# Patient Record
Sex: Male | Born: 1960
Health system: Southern US, Community
[De-identification: ages and names within clinical notes are randomized; demographics above are authoritative.]

## PROBLEM LIST (undated history)

## (undated) DIAGNOSIS — E119 Type 2 diabetes mellitus without complications: Secondary | ICD-10-CM

## (undated) DIAGNOSIS — I10 Essential (primary) hypertension: Secondary | ICD-10-CM

## (undated) HISTORY — DX: Essential (primary) hypertension: I10

## (undated) HISTORY — PX: HERNIA REPAIR: SHX51

---

## 1988-08-20 HISTORY — PX: HERNIA REPAIR: SHX51

## 1999-06-26 ENCOUNTER — Emergency Department (HOSPITAL_COMMUNITY): Admission: EM | Admit: 1999-06-26 | Discharge: 1999-06-26 | Payer: Self-pay | Admitting: Emergency Medicine

## 1999-06-26 ENCOUNTER — Encounter: Payer: Self-pay | Admitting: Emergency Medicine

## 1999-07-07 ENCOUNTER — Emergency Department (HOSPITAL_COMMUNITY): Admission: EM | Admit: 1999-07-07 | Discharge: 1999-07-07 | Payer: Self-pay | Admitting: Emergency Medicine

## 2015-08-24 ENCOUNTER — Emergency Department (HOSPITAL_COMMUNITY): Payer: Self-pay

## 2015-08-24 ENCOUNTER — Emergency Department (HOSPITAL_COMMUNITY)
Admission: EM | Admit: 2015-08-24 | Discharge: 2015-08-25 | Disposition: A | Discharge status: Home / Self Care | Payer: Self-pay | Attending: Emergency Medicine | Admitting: Emergency Medicine

## 2015-08-24 ENCOUNTER — Encounter (HOSPITAL_COMMUNITY): Payer: Self-pay | Admitting: Emergency Medicine

## 2015-08-24 DIAGNOSIS — Z87891 Personal history of nicotine dependence: Secondary | ICD-10-CM | POA: Insufficient documentation

## 2015-08-24 DIAGNOSIS — N410 Acute prostatitis: Secondary | ICD-10-CM | POA: Insufficient documentation

## 2015-08-24 DIAGNOSIS — R1110 Vomiting, unspecified: Secondary | ICD-10-CM | POA: Insufficient documentation

## 2015-08-24 DIAGNOSIS — R739 Hyperglycemia, unspecified: Secondary | ICD-10-CM

## 2015-08-24 DIAGNOSIS — R339 Retention of urine, unspecified: Secondary | ICD-10-CM | POA: Insufficient documentation

## 2015-08-24 DIAGNOSIS — E1165 Type 2 diabetes mellitus with hyperglycemia: Secondary | ICD-10-CM | POA: Insufficient documentation

## 2015-08-24 DIAGNOSIS — Z794 Long term (current) use of insulin: Secondary | ICD-10-CM | POA: Insufficient documentation

## 2015-08-24 HISTORY — DX: Type 2 diabetes mellitus without complications: E11.9

## 2015-08-24 LAB — COMPREHENSIVE METABOLIC PANEL
ALK PHOS: 72 U/L (ref 38–126)
ALT: 24 U/L (ref 17–63)
AST: 23 U/L (ref 15–41)
Albumin: 3.7 g/dL (ref 3.5–5.0)
Anion gap: 13 (ref 5–15)
BILIRUBIN TOTAL: 0.7 mg/dL (ref 0.3–1.2)
BUN: 14 mg/dL (ref 6–20)
CALCIUM: 9.8 mg/dL (ref 8.9–10.3)
CO2: 21 mmol/L — ABNORMAL LOW (ref 22–32)
CREATININE: 0.96 mg/dL (ref 0.61–1.24)
Chloride: 102 mmol/L (ref 101–111)
GFR calc Af Amer: >60 mL/min (ref >60)
GLUCOSE: 309 mg/dL — AB (ref 65–99)
Potassium: 4.2 mmol/L (ref 3.5–5.1)
Sodium: 136 mmol/L (ref 135–145)
TOTAL PROTEIN: 7.8 g/dL (ref 6.5–8.1)

## 2015-08-24 LAB — CBC
HCT: 42.4 % (ref 39.0–52.0)
Hemoglobin: 13.8 g/dL (ref 13.0–17.0)
MCH: 26.1 pg (ref 26.0–34.0)
MCHC: 32.5 g/dL (ref 30.0–36.0)
MCV: 80.2 fL (ref 78.0–100.0)
PLATELETS: 242 10*3/uL (ref 150–400)
RBC: 5.29 MIL/uL (ref 4.22–5.81)
RDW: 13.1 % (ref 11.5–15.5)
WBC: 7.5 10*3/uL (ref 4.0–10.5)

## 2015-08-24 LAB — URINE MICROSCOPIC-ADD ON

## 2015-08-24 LAB — URINALYSIS, ROUTINE W REFLEX MICROSCOPIC
BILIRUBIN URINE: NEGATIVE
Glucose, UA: >1000 mg/dL — AB
Hgb urine dipstick: NEGATIVE
KETONES UR: NEGATIVE mg/dL
LEUKOCYTES UA: NEGATIVE
NITRITE: NEGATIVE
PROTEIN: 100 mg/dL — AB
Specific Gravity, Urine: 1.022 (ref 1.005–1.030)
pH: 7.5 (ref 5.0–8.0)

## 2015-08-24 MED ORDER — CIPROFLOXACIN HCL 500 MG PO TABS
500.0000 mg | ORAL_TABLET | Freq: Once | ORAL | Status: AC
  Administered 2015-08-24: 500 mg via ORAL
  Filled 2015-08-24: qty 1

## 2015-08-24 MED ORDER — CIPROFLOXACIN HCL 500 MG PO TABS: ORAL_TABLET | ORAL | Status: DC

## 2015-08-24 MED ORDER — TAMSULOSIN HCL 0.4 MG PO CAPS: 0.4000 mg | ORAL_CAPSULE | Freq: Every day | ORAL | Status: DC

## 2015-08-24 MED ORDER — OXYCODONE-ACETAMINOPHEN 5-325 MG PO TABS: 1.0000 | ORAL_TABLET | ORAL | Status: DC | PRN

## 2015-08-24 MED ORDER — HYDROMORPHONE HCL 1 MG/ML IJ SOLN
1.0000 mg | Freq: Once | INTRAMUSCULAR | Status: AC
  Administered 2015-08-24: 1 mg via INTRAVENOUS
  Filled 2015-08-24: qty 1

## 2015-08-24 MED ORDER — ONDANSETRON HCL 4 MG/2ML IJ SOLN
4.0000 mg | Freq: Once | INTRAMUSCULAR | Status: AC
  Administered 2015-08-24: 4 mg via INTRAVENOUS
  Filled 2015-08-24: qty 2

## 2015-08-24 MED ORDER — FENTANYL CITRATE (PF) 100 MCG/2ML IJ SOLN
50.0000 ug | Freq: Once | INTRAMUSCULAR | Status: AC
  Administered 2015-08-24: 50 ug via INTRAVENOUS
  Filled 2015-08-24: qty 2

## 2015-08-24 MED ORDER — OXYCODONE-ACETAMINOPHEN 5-325 MG PO TABS
2.0000 | ORAL_TABLET | Freq: Once | ORAL | Status: AC
  Administered 2015-08-24: 2 via ORAL
  Filled 2015-08-24: qty 2

## 2015-08-24 MED ORDER — ONDANSETRON HCL 4 MG/2ML IJ SOLN
4.0000 mg | Freq: Once | INTRAMUSCULAR | Status: AC | PRN
  Administered 2015-08-24: 4 mg via INTRAVENOUS
  Filled 2015-08-24: qty 2

## 2015-08-24 NOTE — Discharge Instructions (Signed)
Prostatitis °Prostatitis is redness, soreness, and puffiness (swelling) of the prostate gland. The prostate gland is the walnut-sized gland located just below your bladder. °HOME CARE:  °· Take all medicines as told by your doctor. °· Take warm-water baths (sitz baths) as told by your doctor. °GET HELP IF: °· Your symptoms get worse, not better. °· You have a fever. °GET HELP RIGHT AWAY IF:  °· You have chills. °· You feel sick to your stomach (nauseous) or like you will throw up (vomit). °· You feel lightheaded or like you will pass out (faint). °· You are unable to pee (urinate). °· You have blood or blood clumps (clots) in your pee (urine). °MAKE SURE YOU: °· Understand these instructions. °· Will watch your condition. °· Will get help right away if you are not doing well or get worse. °  °This information is not intended to replace advice given to you by your health care provider. Make sure you discuss any questions you have with your health care provider. °  °Document Released: 02/05/2012 Document Revised: 08/27/2014 Document Reviewed: 02/23/2013 °Elsevier Interactive Patient Education ©2016 Elsevier Inc. ° °

## 2015-08-24 NOTE — ED Notes (Signed)
EMS-patient to ED for eval of right groin pain, severe in nature, with episode of vomiting. CBG 309. No swelling noted. 18g(LAC)

## 2015-08-24 NOTE — ED Notes (Signed)
Pt o2 saturations dropped after dilaidid administration. Pt placed on 2L [place]. o2 sats 100%.

## 2015-08-24 NOTE — ED Provider Notes (Signed)
CSN: [id number]     Arrival date & time 08/24/15  1740 History   First MD Initiated Contact with Patient 08/24/15 1907     Chief Complaint  Patient presents with  . Groin Pain  . Emesis     Patient is a 55 y.o. male presenting with groin pain and vomiting. The history is provided by the patient.  Groin Pain This is a new problem. The current episode started 12 to 24 hours ago. The problem occurs constantly. The problem has been gradually worsening. Associated symptoms include abdominal pain. Pertinent negatives include no chest pain and no shortness of breath. Exacerbated by: movement. Nothing relieves the symptoms. He has tried nothing for the symptoms.  Emesis Associated symptoms: abdominal pain   pt reports for over the past day he has had RLQ and right groin pain He reports vomiting No diarrhea He has never had this before He reports occurred yesterday, improved, then returned today  Past Medical History  Diagnosis Date  . Diabetes mellitus without complication [facility])    Past Surgical History  Procedure Laterality Date  . Hernia repair Left    No family history on file. Social History  Substance Use Topics  . Smoking status: Former Games developer  . Smokeless tobacco: None  . Alcohol Use: Yes    Review of Systems  Constitutional: Negative for fever.  Respiratory: Negative for shortness of breath.   Cardiovascular: Negative for chest pain.  Gastrointestinal: Positive for vomiting and abdominal pain.  Musculoskeletal: Negative for back pain.  All other systems reviewed and are negative.     Allergies  Review of patient's allergies indicates no known allergies.  Home Medications   Prior to Admission medications   Medication Sig Start Date End Date Taking? Authorizing Provider  insulin aspart (NOVOLOG) 100 UNIT/ML injection Inject 30 Units into the skin 2 (two) times daily. Per sliding scale   Yes Historical Provider, MD   BP 199/116 mmHg  Pulse 94  Temp(Src) 97.7 F  (36.5 C) (Oral)  Resp 20  SpO2 100% Physical Exam CONSTITUTIONAL: Well developed/well nourished, uncomfortable  HEAD: Normocephalic/atraumatic EYES: EOMI ENMT: Mucous membranes moist NECK: supple no meningeal signs SPINE/BACK:entire spine nontender CV: S1/S2 noted, no murmurs/rubs/gallops noted LUNGS: Lungs are clear to auscultation bilaterally, no apparent distress ABDOMEN: soft, severe RLQ tenderness, no rebound or guarding, bowel sounds noted throughout abdomen GU:no cva tenderness, no inguinal hernia, no testicular tenderness/edema (wife at bedside per patient request) NEURO: Pt is awake/alert/appropriate, moves all extremitiesx4.  No facial droop.   EXTREMITIES: pulses normal/equal in both feet, full ROM SKIN: warm, color normal PSYCH: anxious  ED Course  Procedures  Labs Review Labs Reviewed  COMPREHENSIVE METABOLIC PANEL - Abnormal; Notable for the following:    CO2 21 (*)    Glucose, Bld 309 (*)    All other components within normal limits  URINALYSIS, ROUTINE W REFLEX MICROSCOPIC (NOT AT [facility]) - Abnormal; Notable for the following:    Glucose, UA >1000 (*)    Protein, ur 100 (*)    All other components within normal limits  URINE MICROSCOPIC-ADD ON - Abnormal; Notable for the following:    Squamous Epithelial / LPF 0-5 (*)    Bacteria, UA RARE (*)    All other components within normal limits  URINE CULTURE  CBC    Imaging Review Ct Abdomen Pelvis Wo Contrast  08/24/2015  CLINICAL DATA:  55 year old with right groin pain. Episode of vomiting. EXAM: CT ABDOMEN AND PELVIS WITHOUT CONTRAST TECHNIQUE: Multidetector  CT imaging of the abdomen and pelvis was performed following the standard protocol without IV contrast. COMPARISON:  None. FINDINGS: Lower chest:  Lung bases are clear. Hepatobiliary: No mass visualized on this un-enhanced exam. Pancreas: No mass or inflammatory process identified on this un-enhanced exam. Spleen: Within normal limits in size. Adrenals/Urinary  Tract: Normal appearance of both kidneys without hydronephrosis or stones. Normal appearance of the right adrenal gland. Difficult to exclude mild nodularity of the left adrenal gland versus an adjacent small lymph node. Mild to moderate distention of the urinary bladder. Stomach/Bowel: No acute abnormality to the stomach or duodenum. No evidence for bowel obstruction. Normal appearance of the appendix. Few fluid filled loops of small bowel in the right lower quadrant but no evidence for an inguinal hernia. Vascular/Lymphatic: Few atherosclerotic calcifications in the abdominal aorta without significant enlargement. There is no significant lymphadenopathy. Question a small nodule versus small lymph node adjacent to the left adrenal gland. This structure measures up to 1.2 cm on sequence 4, image 39. Small bilateral inguinal lymph nodes. Reproductive: Prostate appears to be enlarged measuring up to 5.6 cm in the transverse dimension. No gross abnormality to the seminal vesicles. Evidence for multiple calcifications along the right inguinal canal extending along the spermatic cord. There are also calcifications on the left side, near the scrotum. Other: No free fluid.  No free air.  Tiny umbilical hernia. Musculoskeletal:  No suspicious bone lesions identified. IMPRESSION: Few fluid-filled loops of small bowel in the right lower quadrant. No acute inflammatory changes in the right lower quadrant and normal appearance of the appendix. Calcifications involving the spermatic cords, right side greater the left. Findings are nonspecific but could be related to prior inflammation. No significant inguinal hernia. Moderate distention of the urinary bladder with prostate enlargement. Question bladder retention. Question a slightly prominent lymph node versus left adrenal nodule. This may be an incidental finding. This area could be better characterized with a post contrast examination. Electronically Signed   By: Richarda Overlie  M.D.   On: 08/24/2015 21:23   I have personally reviewed and evaluated these lab results as part of my medical decision-making.  8:11 PM Pt with significant RLQ tenderness, CT imaging ordered, possible ureterolithiasis possible given groin pain, although currently urine is negative for hematuria Imaging pending at this time 10:27 PM Pt was passing urine in the ED but CT shows possible urinary retention No focal abd tenderness on this exam He appears improved Prostate exam - no mass or nodules.  Prostate is enlarged and tender.  Nurse emmie present for exam Foley catheter in place (bladder scan showed >650ml) Will need start flomax/cipro with urology followup 11:48 PM Pt improved He is nontoxic Will treat for possible prostatitis with urinary retention He was referred to urology Will place on cipro/flomax/percocet  Medications  ondansetron (ZOFRAN) injection 4 mg (4 mg Intravenous Given 08/24/15 1838)  fentaNYL (SUBLIMAZE) injection 50 mcg (50 mcg Intravenous Given 08/24/15 1839)  HYDROmorphone (DILAUDID) injection 1 mg (1 mg Intravenous Given 08/24/15 1942)  ondansetron (ZOFRAN) injection 4 mg (4 mg Intravenous Given 08/24/15 1941)  ciprofloxacin (CIPRO) tablet 500 mg (500 mg Oral Given 08/24/15 2247)  oxyCODONE-acetaminophen (PERCOCET/ROXICET) 5-325 MG per tablet 2 tablet (2 tablets Oral Given 08/24/15 2247)    MDM   Final diagnoses:  Urinary retention  Acute prostatitis  Hyperglycemia    Nursing notes including past medical history and social history reviewed and considered in documentation Labs/vital reviewed myself and considered during evaluation  Zadie Rhineonald Reginald Mangels, MD 08/24/15 2351

## 2015-08-26 LAB — URINE CULTURE: CULTURE: NO GROWTH

## 2015-10-28 DIAGNOSIS — E782 Mixed hyperlipidemia: Secondary | ICD-10-CM | POA: Insufficient documentation

## 2015-10-28 DIAGNOSIS — I10 Essential (primary) hypertension: Secondary | ICD-10-CM | POA: Diagnosis present

## 2018-08-22 ENCOUNTER — Telehealth: Payer: Self-pay

## 2018-08-22 NOTE — Telephone Encounter (Signed)
Prior approval received from [facility] for Vyvanse 50mg . Effective 08/18/2018-08/19/2019 [id number] [facility] faxed approval

## 2018-09-25 ENCOUNTER — Ambulatory Visit: Payer: Self-pay | Admitting: Psychiatry

## 2018-10-11 ENCOUNTER — Emergency Department (HOSPITAL_COMMUNITY)
Admission: EM | Admit: 2018-10-11 | Discharge: 2018-10-11 | Disposition: A | Discharge status: Home / Self Care | Payer: Self-pay | Attending: Emergency Medicine | Admitting: Emergency Medicine

## 2018-10-11 ENCOUNTER — Emergency Department (HOSPITAL_COMMUNITY): Payer: Self-pay

## 2018-10-11 ENCOUNTER — Encounter (HOSPITAL_COMMUNITY): Payer: Self-pay | Admitting: Emergency Medicine

## 2018-10-11 DIAGNOSIS — Z79899 Other long term (current) drug therapy: Secondary | ICD-10-CM | POA: Insufficient documentation

## 2018-10-11 DIAGNOSIS — M79651 Pain in right thigh: Secondary | ICD-10-CM | POA: Insufficient documentation

## 2018-10-11 DIAGNOSIS — Z7982 Long term (current) use of aspirin: Secondary | ICD-10-CM | POA: Insufficient documentation

## 2018-10-11 DIAGNOSIS — E1165 Type 2 diabetes mellitus with hyperglycemia: Secondary | ICD-10-CM | POA: Insufficient documentation

## 2018-10-11 DIAGNOSIS — R739 Hyperglycemia, unspecified: Secondary | ICD-10-CM

## 2018-10-11 LAB — URINALYSIS, ROUTINE W REFLEX MICROSCOPIC
BACTERIA UA: NONE SEEN
Bilirubin Urine: NEGATIVE
Ketones, ur: 5 mg/dL — AB
Leukocytes,Ua: NEGATIVE
NITRITE: NEGATIVE
PH: 6 (ref 5.0–8.0)
PROTEIN: 30 mg/dL — AB
SPECIFIC GRAVITY, URINE: 1.02 (ref 1.005–1.030)

## 2018-10-11 LAB — COMPREHENSIVE METABOLIC PANEL
ALBUMIN: 3.8 g/dL (ref 3.5–5.0)
ALT: 24 U/L (ref 0–44)
AST: 25 U/L (ref 15–41)
Alkaline Phosphatase: 82 U/L (ref 38–126)
Anion gap: 8 (ref 5–15)
BUN: 23 mg/dL — AB (ref 6–20)
CHLORIDE: 101 mmol/L (ref 98–111)
CO2: 24 mmol/L (ref 22–32)
CREATININE: 1.3 mg/dL — AB (ref 0.61–1.24)
Calcium: 9.1 mg/dL (ref 8.9–10.3)
GFR calc Af Amer: >60 mL/min (ref >60)
GFR calc non Af Amer: >60 mL/min (ref >60)
GLUCOSE: 377 mg/dL — AB (ref 70–99)
Potassium: 5.9 mmol/L — ABNORMAL HIGH (ref 3.5–5.1)
SODIUM: 133 mmol/L — AB (ref 135–145)
Total Bilirubin: 0.5 mg/dL (ref 0.3–1.2)
Total Protein: 7.8 g/dL (ref 6.5–8.1)

## 2018-10-11 LAB — CBC WITH DIFFERENTIAL/PLATELET
ABS IMMATURE GRANULOCYTES: 0.01 10*3/uL (ref 0.00–0.07)
BASOS PCT: 1 %
Basophils Absolute: 0 10*3/uL (ref 0.0–0.1)
Eosinophils Absolute: 0 10*3/uL (ref 0.0–0.5)
Eosinophils Relative: 1 %
HCT: 49.3 % (ref 39.0–52.0)
Hemoglobin: 14.2 g/dL (ref 13.0–17.0)
Immature Granulocytes: 0 %
LYMPHS PCT: 26 %
Lymphs Abs: 1.7 10*3/uL (ref 0.7–4.0)
MCH: 25.2 pg — ABNORMAL LOW (ref 26.0–34.0)
MCHC: 28.8 g/dL — ABNORMAL LOW (ref 30.0–36.0)
MCV: 87.6 fL (ref 80.0–100.0)
Monocytes Absolute: 0.6 10*3/uL (ref 0.1–1.0)
Monocytes Relative: 10 %
NEUTROS ABS: 4.2 10*3/uL (ref 1.7–7.7)
Neutrophils Relative %: 62 %
PLATELETS: 199 10*3/uL (ref 150–400)
RBC: 5.63 MIL/uL (ref 4.22–5.81)
RDW: 13.2 % (ref 11.5–15.5)
WBC: 6.7 10*3/uL (ref 4.0–10.5)
nRBC: 0 % (ref 0.0–0.2)

## 2018-10-11 LAB — POTASSIUM: Potassium: 5.1 mmol/L (ref 3.5–5.1)

## 2018-10-11 LAB — CBG MONITORING, ED: Glucose-Capillary: 194 mg/dL — ABNORMAL HIGH (ref 70–99)

## 2018-10-11 LAB — CK: Total CK: 107 U/L (ref 49–397)

## 2018-10-11 MED ORDER — ONDANSETRON HCL 4 MG/2ML IJ SOLN
4.0000 mg | Freq: Once | INTRAMUSCULAR | Status: DC
  Filled 2018-10-11: qty 2

## 2018-10-11 MED ORDER — ONDANSETRON 4 MG PO TBDP
ORAL_TABLET | ORAL | Status: AC
  Filled 2018-10-11: qty 1

## 2018-10-11 MED ORDER — HYDROCODONE-ACETAMINOPHEN 5-325 MG PO TABS: 1.0000 | ORAL_TABLET | ORAL | 0 refills | Status: DC | PRN

## 2018-10-11 MED ORDER — INSULIN ASPART 100 UNIT/ML ~~LOC~~ SOLN
8.0000 [IU] | Freq: Once | SUBCUTANEOUS | Status: AC
  Administered 2018-10-11: 8 [IU] via SUBCUTANEOUS
  Filled 2018-10-11: qty 1

## 2018-10-11 MED ORDER — ONDANSETRON 4 MG PO TBDP
4.0000 mg | ORAL_TABLET | Freq: Once | ORAL | Status: AC
  Administered 2018-10-11: 4 mg via ORAL

## 2018-10-11 MED ORDER — MORPHINE SULFATE (PF) 4 MG/ML IV SOLN
4.0000 mg | Freq: Once | INTRAVENOUS | Status: AC
  Administered 2018-10-11: 4 mg via INTRAMUSCULAR

## 2018-10-11 MED ORDER — IOHEXOL 300 MG/ML  SOLN
75.0000 mL | Freq: Once | INTRAMUSCULAR | Status: AC | PRN
Start: 2018-10-11 — End: 2018-10-11
  Administered 2018-10-11: 75 mL via INTRAVENOUS

## 2018-10-11 MED ORDER — MORPHINE SULFATE (PF) 4 MG/ML IV SOLN
4.0000 mg | Freq: Once | INTRAVENOUS | Status: DC
  Filled 2018-10-11: qty 1

## 2018-10-11 MED ORDER — HYDROCODONE-ACETAMINOPHEN 5-325 MG PO TABS
1.0000 | ORAL_TABLET | Freq: Once | ORAL | Status: AC
  Administered 2018-10-11: 1 via ORAL
  Filled 2018-10-11: qty 1

## 2018-10-11 NOTE — Discharge Instructions (Signed)
Your lab tests, ultrasound and x-rays are negative today for the source of your pain.  Your blood glucose is very elevated today but improving after the insulin medication given.  Keep a close watch on your blood glucose levels and please get rechecked within 1 week.  The [facility] is part of [facility] and this is an excellent place for follow-up care from today's visit.  They also have plenty of resources available for obtaining medications and arranging follow-up medical care.  You may use the medication prescribed for pain relief, however do not drive within 4 hours of taking this medication as it will make you drowsy.  I also recommend a heating pad applied to your thigh for 20 minutes 2-3 times daily which may help offer pain relief.  [facility]  [address] [place], [place] [address] [phone]  Services [facility] offers a variety of basic health services.  Services include but are not limited to: Blood pressure checks  Heart rate checks  Blood sugar checks  Urine analysis  Rapid strep tests  Pregnancy tests.  Health education and referrals  People needing more complex services will be directed to a physician online. Using these virtual visits, doctors can evaluate and prescribe medicine and treatments. There will be no medication on-site, though [facility] will help patients fill their prescriptions at little to no cost.   For More information please go to: [email or web address]

## 2018-10-11 NOTE — ED Triage Notes (Signed)
Pt states he has been having pain in right leg from groin to mid thigh for a week.  No injury.

## 2018-10-11 NOTE — ED Provider Notes (Signed)
[facility]ERGENCY DEPARTMENT Provider Note   CSN: [id number] Arrival date & time: 10/11/18  0815    History   Chief Complaint Chief Complaint  Patient presents with  . Leg Pain    HPI Chad Kirk is a 58 y.o. male with a history of diabetes, no PCP but is treating his diabetes with insulin under fair control presenting with a one-week history of right mid anterior thigh to groin pain.  He denies any injuries to the site.  His pain is described as a deep aching pain which is worsened with movement in palpation or pressure applied to the thigh.  He has had no fevers or chills, denies rash, skin changes or wounds, no edema.  He denies history of low back or buttock pain.  Also denies scrotal or penile pain, no dysuria or penile discharge.  He has taken Advil and Tylenol with no significant improvement in his symptoms.  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    The history is provided by the patient.    Past Medical History:  Diagnosis Date  . Diabetes mellitus without complication (HCC)     There are no active problems to display for this patient.   Past Surgical History:  Procedure Laterality Date  . HERNIA REPAIR Left         Home Medications    Prior to Admission medications   Medication Sig Start Date End Date Taking? Authorizing Provider  aspirin EC 81 MG tablet Take 1 tablet by mouth daily.   Yes [provider]  insulin NPH Human (HUMULIN N,NOVOLIN N) 100 UNIT/ML injection Inject 12-15 Units into the skin 2 (two) times daily. Inject 15 units under the skin 30 min before breakfast. Inject 12 units under the skin 30 min before supper. 10/13/15  Yes [provider]  insulin regular (NOVOLIN R,HUMULIN R) 100 units/mL injection Inject 4-12 Units into the skin See admin instructions. Inject 8 units under the skin 30 min before each meal. Take 4 units for a small meal and 12 units  for a large meal. 10/13/15  Yes [provider]  naproxen sodium (ALEVE) 220 MG tablet Take 220 mg by mouth daily as needed (pain).   Yes [provider]  HYDROcodone-acetaminophen (NORCO/VICODIN) 5-325 MG tablet Take 1 tablet by mouth every 4 (four) hours as needed. 10/11/18   Burgess AmorIdol, Rashena Dowling, PA-C    Family History History reviewed. No pertinent family history.  Social History Social History   Tobacco Use  . Smoking status: Former Games developermoker  . Smokeless tobacco: Never Used  Substance Use Topics  . Alcohol use: Yes    Comment: daily  . Drug use: No     Allergies   Patient has no known allergies.   Review of Systems Review of Systems  Constitutional: Negative for chills and fever.  HENT: Negative for congestion and sore throat.   Eyes: Negative.   Respiratory: Negative for chest tightness and shortness of breath.   Cardiovascular: Negative for chest pain.  Gastrointestinal: Negative for abdominal pain, nausea and vomiting.  Genitourinary: Negative.   Musculoskeletal: Positive for arthralgias. Negative for joint swelling and neck pain.  Skin: Negative.  Negative for color change, rash and wound.  Neurological: Negative for dizziness, weakness, light-headedness, numbness and headaches.  Psychiatric/Behavioral: Negative.      Physical Exam Updated Vital Signs BP (!) 142/102 (BP Location: Left Arm)  Pulse 84   Temp 98.2 F (36.8 C) (Oral)   Resp 16   Ht 5\' 10"  (1.778 m)   Wt 77.1 kg   SpO2 98%   BMI 24.39 kg/m   Physical Exam Vitals signs and nursing note reviewed. Exam conducted with a chaperone present.  Constitutional:      Appearance: He is well-developed.  HENT:     Head: Normocephalic and atraumatic.  Eyes:     Conjunctiva/sclera: Conjunctivae normal.  Neck:     Musculoskeletal: Normal range of motion. No muscular tenderness.  Cardiovascular:     Rate and Rhythm: Normal rate and regular rhythm.     Heart sounds: Normal heart sounds.    Pulmonary:     Effort: Pulmonary effort is normal.     Breath sounds: Normal breath sounds. No wheezing.  Abdominal:     General: Bowel sounds are normal. There is no distension.     Palpations: Abdomen is soft.     Tenderness: There is no abdominal tenderness.  Genitourinary:    Penis: Normal.      Scrotum/Testes: Normal.        Right: Mass, tenderness or swelling not present.        Left: Mass, tenderness or swelling not present.  Musculoskeletal: Normal range of motion.     Right hip: He exhibits tenderness. He exhibits no crepitus and no deformity.     Comments: Patient is exquisitely tender to palpation of his musculature of his right anterior mid to upper thigh to his groin.  There is no rash, erythema, induration or wounds present.  Nontender over the greater trochanter.  Pain is worsened with attempts at hip flexion and with internal and external rotation.  Skin:    General: Skin is warm and dry.  Neurological:     Mental Status: He is alert.      ED Treatments / Results  Labs (all labs ordered are listed, but only abnormal results are displayed) Labs Reviewed  CBC WITH DIFFERENTIAL/PLATELET - Abnormal; Notable for the following components:      Result Value   MCH 25.2 (*)    MCHC 28.8 (*)    All other components within normal limits  COMPREHENSIVE METABOLIC PANEL - Abnormal; Notable for the following components:   Sodium 133 (*)    Potassium 5.9 (*)    Glucose, Bld 377 (*)    BUN 23 (*)    Creatinine, Ser 1.30 (*)    All other components within normal limits  URINALYSIS, ROUTINE W REFLEX MICROSCOPIC - Abnormal; Notable for the following components:   Color, Urine STRAW (*)    Glucose, UA >=500 (*)    Hgb urine dipstick SMALL (*)    Ketones, ur 5 (*)    Protein, ur 30 (*)    All other components within normal limits  CBG MONITORING, ED - Abnormal; Notable for the following components:   Glucose-Capillary 194 (*)    All other components within normal limits   POTASSIUM  CK    EKG None  Radiology US Venous Img Lower Right (dvt Study)  Result Date: 10/11/2018 CLINICAL DATA:  58 year old with right leg pain. EXAM: RIGHT LOWER EXTREMITY VENOUS DOPPLER ULTRASOUND TECHNIQUE: Gray-scale sonography with graded compression, as well as color Doppler and duplex ultrasound were performed to evaluate the lower extremity deep venous systems from the level of the common femoral vein and including the common femoral, femoral, profunda femoral, popliteal and calf veins including the posterior tibial, peroneal and  gastrocnemius veins when visible. The superficial great saphenous vein was also interrogated. Spectral Doppler was utilized to evaluate flow at rest and with distal augmentation maneuvers in the common femoral, femoral and popliteal veins. COMPARISON:  None. FINDINGS: Contralateral Common Femoral Vein: Respiratory phasicity is normal and symmetric with the symptomatic side. No evidence of thrombus. Normal compressibility. Common Femoral Vein: No evidence of thrombus. Normal compressibility, respiratory phasicity and response to augmentation. Saphenofemoral Junction: No evidence of thrombus. Normal compressibility and flow on color Doppler imaging. Profunda Femoral Vein: No evidence of thrombus. Normal compressibility and flow on color Doppler imaging. Femoral Vein: No evidence of thrombus. Normal compressibility, respiratory phasicity and response to augmentation. Popliteal Vein: No evidence of thrombus. Normal compressibility, respiratory phasicity and response to augmentation. Calf Veins: No evidence of thrombus. Normal compressibility and flow on color Doppler imaging. Superficial Great Saphenous Vein: No evidence of thrombus. Normal compressibility. Venous Reflux:  None. Other Findings:  None. IMPRESSION: Negative for deep venous thrombosis in right lower extremity. Electronically Signed   By: Richarda Overlie M.D.   On: 10/11/2018 11:11   Ct Extremity Lower Right W  Contrast  Result Date: 10/11/2018 CLINICAL DATA:  Right leg pain from the groin to the mid thigh for 1 week. No known injury. Diabetic patient. Clinical concern for necrotizing fasciitis. EXAM: CT OF THE LOWER RIGHT EXTREMITY WITH CONTRAST TECHNIQUE: Multidetector CT imaging of the lower right extremity was performed according to the standard protocol following intravenous contrast administration. COMPARISON:  None. CONTRAST:  75 mL OMNIPAQUE IOHEXOL 300 MG/ML  SOLN FINDINGS: Bones/Joint/Cartilage No acute or focal abnormality is identified. Mild degenerative change about the right hip and knee noted. No avascular necrosis of the femoral head. No lytic or sclerotic lesion. Ligaments Suboptimally assessed by CT. Muscles and Tendons No soft tissue gas within muscle or tracking along fascial planes is identified. No evidence of intramuscular edema. No fluid collection. No tear or strain. Soft tissues Normal. IMPRESSION: Normal exam. No finding to explain the patient's symptoms. Negative for fasciitis. Electronically Signed   By: Drusilla Kanner M.D.   On: 10/11/2018 13:02   Dg Hip Unilat W Or W/o Pelvis 2-3 Views Right  Result Date: 10/11/2018 CLINICAL DATA:  Right hip pain 1 week no injury EXAM: DG HIP (WITH OR WITHOUT PELVIS) 2-3V RIGHT COMPARISON:  None. FINDINGS: Normal alignment. No fracture or AVN. Joint space normal. Benign-appearing lucencies in the lateral femoral neck likely synovial herniation pit, not likely clinically significant. IMPRESSION: No acute abnormality. Electronically Signed   By: Marlan Palau M.D.   On: 10/11/2018 09:45    Procedures Procedures (including critical care time)  Medications Ordered in ED Medications  ondansetron (ZOFRAN-ODT) 4 MG disintegrating tablet (  Not Given 10/11/18 0936)  morphine 4 MG/ML injection 4 mg (4 mg Intramuscular Given 10/11/18 0936)  ondansetron (ZOFRAN-ODT) disintegrating tablet 4 mg (4 mg Oral Given 10/11/18 0936)  insulin aspart (novoLOG)  injection 8 Units (8 Units Subcutaneous Given 10/11/18 1024)  iohexol (OMNIPAQUE) 300 MG/ML solution 75 mL (75 mLs Intravenous Contrast Given 10/11/18 1238)  HYDROcodone-acetaminophen (NORCO/VICODIN) 5-325 MG per tablet 1 tablet (1 tablet Oral Given 10/11/18 1403)     Initial Impression / Assessment and Plan / ED Course  I have reviewed the triage vital signs and the nursing notes.  Pertinent labs & imaging results that were available during my care of the patient were reviewed by me and considered in my medical decision making (see chart for details).  Imaging and labs reviewed and discussed with patient and his wife.  Patient with significant right thigh pain of unclear etiology.  We have ruled out worse case scenario necrotizing fasciitis.  No DVT, no significant arthritis or bony abnormalities.  He was placed on hydrocodone, caution given regarding sedation.  Discussed heat therapy.  I suspect that his pain is musculoskeletal source, possible muscle strain, although no clear-cut history suggesting this.  He was given multiple referrals for follow-up care including obtaining a PCP.  His blood glucose was elevated today, improved at the time of discharge after given subcu insulin.  Advised recheck within 1 week with the [facility].  PRN follow-up anticipated.  Final Clinical Impressions(s) / ED Diagnoses   Final diagnoses:  Acute thigh pain, right  Hyperglycemia    ED Discharge Orders         Ordered    HYDROcodone-acetaminophen (NORCO/VICODIN) 5-325 MG tablet  Every 4 hours PRN     10/11/18 1329           Burgess Amor, PA-C 10/11/18 1405    Samuel Jester, DO 10/15/18 1506

## 2018-10-18 DIAGNOSIS — G5711 Meralgia paresthetica, right lower limb: Secondary | ICD-10-CM | POA: Insufficient documentation

## 2018-10-19 DIAGNOSIS — F1020 Alcohol dependence, uncomplicated: Secondary | ICD-10-CM | POA: Insufficient documentation

## 2018-10-19 DIAGNOSIS — N400 Enlarged prostate without lower urinary tract symptoms: Secondary | ICD-10-CM | POA: Diagnosis present

## 2018-11-12 DIAGNOSIS — IMO0002 Reserved for concepts with insufficient information to code with codable children: Secondary | ICD-10-CM | POA: Diagnosis present

## 2018-11-12 DIAGNOSIS — E119 Type 2 diabetes mellitus without complications: Secondary | ICD-10-CM | POA: Diagnosis present

## 2018-11-12 DIAGNOSIS — E1165 Type 2 diabetes mellitus with hyperglycemia: Secondary | ICD-10-CM | POA: Insufficient documentation

## 2018-11-12 DIAGNOSIS — Z794 Long term (current) use of insulin: Secondary | ICD-10-CM | POA: Insufficient documentation

## 2018-11-12 DIAGNOSIS — E1139 Type 2 diabetes mellitus with other diabetic ophthalmic complication: Secondary | ICD-10-CM | POA: Diagnosis present

## 2019-06-29 ENCOUNTER — Encounter (INDEPENDENT_AMBULATORY_CARE_PROVIDER_SITE_OTHER): Payer: Self-pay | Admitting: Ophthalmology

## 2019-07-01 ENCOUNTER — Encounter (INDEPENDENT_AMBULATORY_CARE_PROVIDER_SITE_OTHER): Payer: Self-pay | Admitting: Ophthalmology

## 2019-07-01 DIAGNOSIS — H43813 Vitreous degeneration, bilateral: Secondary | ICD-10-CM

## 2019-07-01 DIAGNOSIS — E103513 Type 1 diabetes mellitus with proliferative diabetic retinopathy with macular edema, bilateral: Secondary | ICD-10-CM

## 2019-07-01 DIAGNOSIS — E10311 Type 1 diabetes mellitus with unspecified diabetic retinopathy with macular edema: Secondary | ICD-10-CM

## 2019-07-01 DIAGNOSIS — H2513 Age-related nuclear cataract, bilateral: Secondary | ICD-10-CM

## 2019-07-03 DIAGNOSIS — H3581 Retinal edema: Secondary | ICD-10-CM | POA: Diagnosis present

## 2019-07-03 DIAGNOSIS — H2512 Age-related nuclear cataract, left eye: Secondary | ICD-10-CM | POA: Insufficient documentation

## 2019-08-10 DIAGNOSIS — E11311 Type 2 diabetes mellitus with unspecified diabetic retinopathy with macular edema: Secondary | ICD-10-CM | POA: Insufficient documentation

## 2019-08-10 DIAGNOSIS — E113522 Type 2 diabetes mellitus with proliferative diabetic retinopathy with traction retinal detachment involving the macula, left eye: Secondary | ICD-10-CM | POA: Insufficient documentation

## 2019-11-16 DIAGNOSIS — H3342 Traction detachment of retina, left eye: Secondary | ICD-10-CM | POA: Insufficient documentation

## 2019-12-04 ENCOUNTER — Inpatient Hospital Stay (HOSPITAL_COMMUNITY)
Admission: EM | Admit: 2019-12-04 | Discharge: 2019-12-08 | DRG: 073 | Disposition: A | Discharge status: Home / Self Care | Payer: Self-pay | Attending: Family Medicine | Admitting: Family Medicine

## 2019-12-04 ENCOUNTER — Emergency Department (HOSPITAL_COMMUNITY): Payer: Self-pay

## 2019-12-04 ENCOUNTER — Encounter (HOSPITAL_COMMUNITY): Payer: Self-pay | Admitting: Emergency Medicine

## 2019-12-04 ENCOUNTER — Other Ambulatory Visit: Payer: Self-pay

## 2019-12-04 DIAGNOSIS — Z20822 Contact with and (suspected) exposure to covid-19: Secondary | ICD-10-CM | POA: Diagnosis present

## 2019-12-04 DIAGNOSIS — N401 Enlarged prostate with lower urinary tract symptoms: Secondary | ICD-10-CM | POA: Diagnosis present

## 2019-12-04 DIAGNOSIS — E119 Type 2 diabetes mellitus without complications: Secondary | ICD-10-CM | POA: Diagnosis present

## 2019-12-04 DIAGNOSIS — N138 Other obstructive and reflux uropathy: Secondary | ICD-10-CM | POA: Diagnosis present

## 2019-12-04 DIAGNOSIS — Z87891 Personal history of nicotine dependence: Secondary | ICD-10-CM

## 2019-12-04 DIAGNOSIS — E11319 Type 2 diabetes mellitus with unspecified diabetic retinopathy without macular edema: Secondary | ICD-10-CM | POA: Diagnosis present

## 2019-12-04 DIAGNOSIS — IMO0002 Reserved for concepts with insufficient information to code with codable children: Secondary | ICD-10-CM | POA: Diagnosis present

## 2019-12-04 DIAGNOSIS — Z87440 Personal history of urinary (tract) infections: Secondary | ICD-10-CM

## 2019-12-04 DIAGNOSIS — E10319 Type 1 diabetes mellitus with unspecified diabetic retinopathy without macular edema: Secondary | ICD-10-CM | POA: Diagnosis present

## 2019-12-04 DIAGNOSIS — E1139 Type 2 diabetes mellitus with other diabetic ophthalmic complication: Secondary | ICD-10-CM | POA: Diagnosis present

## 2019-12-04 DIAGNOSIS — R112 Nausea with vomiting, unspecified: Secondary | ICD-10-CM

## 2019-12-04 DIAGNOSIS — K254 Chronic or unspecified gastric ulcer with hemorrhage: Secondary | ICD-10-CM | POA: Diagnosis present

## 2019-12-04 DIAGNOSIS — R339 Retention of urine, unspecified: Secondary | ICD-10-CM | POA: Diagnosis present

## 2019-12-04 DIAGNOSIS — R1310 Dysphagia, unspecified: Secondary | ICD-10-CM | POA: Diagnosis present

## 2019-12-04 DIAGNOSIS — R451 Restlessness and agitation: Secondary | ICD-10-CM | POA: Diagnosis not present

## 2019-12-04 DIAGNOSIS — K319 Disease of stomach and duodenum, unspecified: Secondary | ICD-10-CM | POA: Diagnosis present

## 2019-12-04 DIAGNOSIS — N400 Enlarged prostate without lower urinary tract symptoms: Secondary | ICD-10-CM | POA: Diagnosis present

## 2019-12-04 DIAGNOSIS — K3184 Gastroparesis: Secondary | ICD-10-CM | POA: Diagnosis present

## 2019-12-04 DIAGNOSIS — E1043 Type 1 diabetes mellitus with diabetic autonomic (poly)neuropathy: Principal | ICD-10-CM | POA: Diagnosis present

## 2019-12-04 DIAGNOSIS — H3581 Retinal edema: Secondary | ICD-10-CM | POA: Diagnosis present

## 2019-12-04 DIAGNOSIS — R1115 Cyclical vomiting syndrome unrelated to migraine: Secondary | ICD-10-CM | POA: Diagnosis present

## 2019-12-04 DIAGNOSIS — E860 Dehydration: Secondary | ICD-10-CM | POA: Diagnosis present

## 2019-12-04 DIAGNOSIS — Z794 Long term (current) use of insulin: Secondary | ICD-10-CM

## 2019-12-04 DIAGNOSIS — K2960 Other gastritis without bleeding: Secondary | ICD-10-CM | POA: Diagnosis present

## 2019-12-04 DIAGNOSIS — R1110 Vomiting, unspecified: Secondary | ICD-10-CM

## 2019-12-04 DIAGNOSIS — E1069 Type 1 diabetes mellitus with other specified complication: Secondary | ICD-10-CM | POA: Diagnosis present

## 2019-12-04 DIAGNOSIS — I10 Essential (primary) hypertension: Secondary | ICD-10-CM | POA: Diagnosis present

## 2019-12-04 DIAGNOSIS — E1065 Type 1 diabetes mellitus with hyperglycemia: Secondary | ICD-10-CM | POA: Diagnosis present

## 2019-12-04 DIAGNOSIS — R1011 Right upper quadrant pain: Secondary | ICD-10-CM | POA: Diagnosis present

## 2019-12-04 DIAGNOSIS — N179 Acute kidney failure, unspecified: Secondary | ICD-10-CM | POA: Diagnosis present

## 2019-12-04 DIAGNOSIS — Z7982 Long term (current) use of aspirin: Secondary | ICD-10-CM

## 2019-12-04 LAB — URINALYSIS, ROUTINE W REFLEX MICROSCOPIC
Bilirubin Urine: NEGATIVE
Glucose, UA: >=500 mg/dL — AB
Ketones, ur: 20 mg/dL — AB
Leukocytes,Ua: NEGATIVE
Nitrite: NEGATIVE
Protein, ur: 100 mg/dL — AB
Specific Gravity, Urine: 1.025 (ref 1.005–1.030)
pH: 6 (ref 5.0–8.0)

## 2019-12-04 LAB — COMPREHENSIVE METABOLIC PANEL
ALT: 18 U/L (ref 0–44)
AST: 17 U/L (ref 15–41)
Albumin: 3.5 g/dL (ref 3.5–5.0)
Alkaline Phosphatase: 60 U/L (ref 38–126)
Anion gap: 11 (ref 5–15)
BUN: 28 mg/dL — ABNORMAL HIGH (ref 6–20)
CO2: 20 mmol/L — ABNORMAL LOW (ref 22–32)
Calcium: 8.8 mg/dL — ABNORMAL LOW (ref 8.9–10.3)
Chloride: 99 mmol/L (ref 98–111)
Creatinine, Ser: 1.5 mg/dL — ABNORMAL HIGH (ref 0.61–1.24)
GFR calc Af Amer: 58 mL/min — ABNORMAL LOW (ref >60)
GFR calc non Af Amer: 50 mL/min — ABNORMAL LOW (ref >60)
Glucose, Bld: 396 mg/dL — ABNORMAL HIGH (ref 70–99)
Potassium: 4.5 mmol/L (ref 3.5–5.1)
Sodium: 130 mmol/L — ABNORMAL LOW (ref 135–145)
Total Bilirubin: 1.3 mg/dL — ABNORMAL HIGH (ref 0.3–1.2)
Total Protein: 7.3 g/dL (ref 6.5–8.1)

## 2019-12-04 LAB — CBC
HCT: 46.6 % (ref 39.0–52.0)
Hemoglobin: 14.5 g/dL (ref 13.0–17.0)
MCH: 26.2 pg (ref 26.0–34.0)
MCHC: 31.1 g/dL (ref 30.0–36.0)
MCV: 84.1 fL (ref 80.0–100.0)
Platelets: 260 10*3/uL (ref 150–400)
RBC: 5.54 MIL/uL (ref 4.22–5.81)
RDW: 12.9 % (ref 11.5–15.5)
WBC: 9.5 10*3/uL (ref 4.0–10.5)
nRBC: 0 % (ref 0.0–0.2)

## 2019-12-04 LAB — LIPASE, BLOOD: Lipase: 33 U/L (ref 11–51)

## 2019-12-04 MED ORDER — HALOPERIDOL LACTATE 5 MG/ML IJ SOLN
2.0000 mg | Freq: Once | INTRAMUSCULAR | Status: AC
  Administered 2019-12-05: 2 mg via INTRAVENOUS
  Filled 2019-12-04: qty 1

## 2019-12-04 MED ORDER — INSULIN ASPART 100 UNIT/ML ~~LOC~~ SOLN
6.0000 [IU] | Freq: Once | SUBCUTANEOUS | Status: AC
  Administered 2019-12-05: 6 [IU] via SUBCUTANEOUS
  Filled 2019-12-04: qty 1

## 2019-12-04 MED ORDER — ONDANSETRON HCL 4 MG/2ML IJ SOLN
4.0000 mg | Freq: Once | INTRAMUSCULAR | Status: AC
  Administered 2019-12-04: 4 mg via INTRAVENOUS
  Filled 2019-12-04: qty 2

## 2019-12-04 MED ORDER — SODIUM CHLORIDE 0.9 % IV BOLUS (SEPSIS)
1000.0000 mL | Freq: Once | INTRAVENOUS | Status: AC
  Administered 2019-12-04: 1000 mL via INTRAVENOUS

## 2019-12-04 MED ORDER — SODIUM CHLORIDE 0.9 % IV SOLN
1000.0000 mL | INTRAVENOUS | Status: DC
  Administered 2019-12-04: 1000 mL via INTRAVENOUS

## 2019-12-04 MED ORDER — METOCLOPRAMIDE HCL 5 MG/ML IJ SOLN
10.0000 mg | Freq: Once | INTRAMUSCULAR | Status: AC
  Administered 2019-12-05: 10 mg via INTRAVENOUS
  Filled 2019-12-04: qty 2

## 2019-12-04 NOTE — ED Triage Notes (Signed)
Pt C/o emesis and right sided pain that started on Saturday. Pt reports he was seen at [facility] on Wednesday and Dx with UTI and urinary retention. Pt had foley placed at that time.

## 2019-12-04 NOTE — ED Provider Notes (Signed)
[facility] EMERGENCY DEPARTMENT Provider Note   CSN: [id number] Arrival date & time: 12/04/19  2022     History Chief Complaint  Patient presents with  . Emesis    Chad Kirk is a 59 y.o. male.  HPI   Patient presents to the emergency room for evaluation of emesis and abdominal pain.  Patient states the symptoms started on Wednesday.  He was seen at [facility] for the same complaints 3 days ago.  Patient states he had an evaluation that included a CT scan.  He was also found to have urinary retention and had a Foley catheter placed.  Patient states since that time he continues to have issues with nausea and vomiting.  Recur several times per day.  He has been able to keep down Ensure but he continues to vomit.  No fevers or chills.  No diarrhea or constipation  According to the medical records patient was prescribed Bactrim as well as Reglan.  He had a CT scan of his abdomen pelvis performed on April 14  Past Medical History:  Diagnosis Date  . Diabetes mellitus without complication (HCC)     There are no problems to display for this patient.   Past Surgical History:  Procedure Laterality Date  . HERNIA REPAIR Left        No family history on file.  Social History   Tobacco Use  . Smoking status: Former Games developer  . Smokeless tobacco: Never Used  Substance Use Topics  . Alcohol use: Yes    Comment: daily  . Drug use: No    Home Medications Prior to Admission medications   Medication Sig Start Date End Date Taking? Authorizing Provider  aspirin EC 81 MG tablet Take 1 tablet by mouth daily.    [provider]  HYDROcodone-acetaminophen (NORCO/VICODIN) 5-325 MG tablet Take 1 tablet by mouth every 4 (four) hours as needed. 10/11/18   Idol, Raynelle Fanning, PA-C  insulin NPH Human (HUMULIN N,NOVOLIN N) 100 UNIT/ML injection Inject 12-15 Units into the skin 2 (two) times daily. Inject 15 units under the skin 30 min before breakfast. Inject 12 units under the skin  30 min before supper. 10/13/15   [provider]  insulin regular (NOVOLIN R,HUMULIN R) 100 units/mL injection Inject 4-12 Units into the skin See admin instructions. Inject 8 units under the skin 30 min before each meal. Take 4 units for a small meal and 12 units for a large meal. 10/13/15   [provider]  naproxen sodium (ALEVE) 220 MG tablet Take 220 mg by mouth daily as needed (pain).    [provider]    Allergies    Patient has no known allergies.  Review of Systems   Review of Systems  All other systems reviewed and are negative.   Physical Exam Updated Vital Signs BP 97/72 (BP Location: Left Arm)   Pulse (!) 102   Temp 97.7 F (36.5 C) (Oral)   Resp 16   Ht 1.778 m (5\' 10" )   Wt 78.5 kg   SpO2 100%   BMI 24.82 kg/m   Physical Exam Vitals and nursing note reviewed.  Constitutional:      Appearance: He is well-developed.  HENT:     Head: Normocephalic and atraumatic.     Right Ear: External ear normal.     Left Ear: External ear normal.  Eyes:     General: No scleral icterus.       Right eye: No discharge.  Left eye: No discharge.     Conjunctiva/sclera: Conjunctivae normal.  Neck:     Trachea: No tracheal deviation.  Cardiovascular:     Rate and Rhythm: Normal rate and regular rhythm.  Pulmonary:     Effort: Pulmonary effort is normal. No respiratory distress.     Breath sounds: Normal breath sounds. No stridor. No wheezing or rales.  Abdominal:     General: Bowel sounds are normal. There is no distension.     Palpations: Abdomen is soft.     Tenderness: There is generalized abdominal tenderness. There is no guarding or rebound.  Musculoskeletal:        General: No tenderness.     Cervical back: Neck supple.  Skin:    General: Skin is warm and dry.     Findings: No rash.  Neurological:     Mental Status: He is alert.     Cranial Nerves: No cranial nerve deficit (no facial droop, extraocular movements intact, no slurred  speech).     Sensory: No sensory deficit.     Motor: No abnormal muscle tone or seizure activity.     Coordination: Coordination normal.     ED Results / Procedures / Treatments   Labs (all labs ordered are listed, but only abnormal results are displayed) Labs Reviewed  COMPREHENSIVE METABOLIC PANEL - Abnormal; Notable for the following components:      Result Value   Sodium 130 (*)    CO2 20 (*)    Glucose, Bld 396 (*)    BUN 28 (*)    Creatinine, Ser 1.50 (*)    Calcium 8.8 (*)    Total Bilirubin 1.3 (*)    GFR calc non Af Amer 50 (*)    GFR calc Af Amer 58 (*)    All other components within normal limits  URINALYSIS, ROUTINE W REFLEX MICROSCOPIC - Abnormal; Notable for the following components:   Glucose, UA >=500 (*)    Hgb urine dipstick MODERATE (*)    Ketones, ur 20 (*)    Protein, ur 100 (*)    Bacteria, UA RARE (*)    All other components within normal limits  BASIC METABOLIC PANEL - Abnormal; Notable for the following components:   Sodium 134 (*)    CO2 20 (*)    Glucose, Bld 230 (*)    BUN 23 (*)    Creatinine, Ser 1.37 (*)    Calcium 8.2 (*)    GFR calc non Af Amer 56 (*)    All other components within normal limits  BLOOD GAS, VENOUS - Abnormal; Notable for the following components:   pCO2, Ven 39.5 (*)    pO2, Ven 92.3 (*)    All other components within normal limits  GLUCOSE, CAPILLARY - Abnormal; Notable for the following components:   Glucose-Capillary 231 (*)    All other components within normal limits  SARS CORONAVIRUS 2 (TAT 6-24 HRS)  LIPASE, BLOOD  CBC  LACTIC ACID, PLASMA  HIV ANTIBODY (ROUTINE TESTING W REFLEX)  HEMOGLOBIN A1C  RAPID URINE DRUG SCREEN, HOSP PERFORMED  CBG MONITORING, ED      Radiology No results found. [facility] results from [facility] evaluation April 14FINDINGS:   LOWER CHEST: . Mediastinum: Within normal limits.  . Cardiovascular: Normal heart size. No pericardial effusion. No significant coronary artery or  valvular calcifications. . Lungs & pleura: No pulmonary parenchymal disease, pleural effusion, or pneumothorax.  ABDOMEN & PELVIS: . Liver: Contour within normal limits.  . Gallbladder &  biliary: No cholelithiasis, wall thickening, or pericholecystic edema. No biliary ductal dilation. . Pancreas: Contour within normal limits.  . Spleen: Contour within normal limits. Accessory splenule in the hilum. . Adrenals: Contours within normal limits.   . GI tract: No focally dilated loops of bowel to suggest obstruction. The appendix is not identified and may be surgically absent. Scattered colonic diverticulosis without adjacent inflammatory stranding.  . Kidneys: No hydronephrosis or nephrolithiasis.  . Ureters: Contours within normal limits.  . Bladder: The bladder is distended.  . Reproductive system: Prostate is moderately enlarged.   . Peritoneum / mesentery / extraperitoneum: No mesenteric or retroperitoneal adenopathy. No pneumoperitoneum. No abdominopelvic free fluid.  . Vascular: Scattered aortoiliac atherosclerotic calcifications. No aneurysm.   MUSCULOSKELETAL: . Within normal limits. Multiple bone islands in the pelvis. Small fat-containing umbilical hernia.   CONCLUSION:  No acute findings in the abdomen or pelvis on this noncontrasted examination Procedures Procedures (including critical care time)  Medications Ordered in ED Medications  sodium chloride 0.9 % bolus 1,000 mL (has no administration in time range)    Followed by  0.9 %  sodium chloride infusion (has no administration in time range)  ondansetron (ZOFRAN) injection 4 mg (has no administration in time range)    ED Course  I have reviewed the triage vital signs and the nursing notes.  Pertinent labs & imaging results that were available during my care of the patient were reviewed by me and considered in my medical decision making (see chart for details).  Clinical Course as of Dec 04 1221  Fri Dec 04, 2019    2341 Labs reviewed.  Patient has hyperglycemia without significant acidosis.  White blood cell count is normal.  Urinalysis shows hematuria but no infection.   [JK]  2342 Patient had been doing well but as I walked into the room he is now retching again.  No significant emesis noted.   [JK]  2342 X-ray does not show any evidence of obstruction.  Patient had a CT scan within the last couple of days that did not show acute findings.  I suspect his symptoms may be related to gastroparesis.   [JK]    Clinical Course User Index [JK] Linwood Dibbles, MD   MDM Rules/Calculators/A&P                      Pt presented with persistent abd pain vomiting.  Recently seen at another hospital, had CT scan labs.  Records reviewed from that visit.  In the ED intermittent episodes of dry heaves, abd pain.  Labs notable for hyperglycemia, not suggestive of DKA.  Obstruction, pancreatitis, choleycstitis unlikely.  ?gastroparesis.  Plan on additional medications for symptoms control.  Care turned over to Dr Manus Gunning.  Plan on reassess to see if sx controlled Final Clinical Impression(s) / ED Diagnoses pending   Linwood Dibbles, MD 12/05/19 1226

## 2019-12-05 ENCOUNTER — Observation Stay (HOSPITAL_COMMUNITY): Payer: Self-pay

## 2019-12-05 ENCOUNTER — Other Ambulatory Visit: Payer: Self-pay

## 2019-12-05 ENCOUNTER — Encounter (HOSPITAL_COMMUNITY): Payer: Self-pay | Admitting: Internal Medicine

## 2019-12-05 DIAGNOSIS — R1110 Vomiting, unspecified: Secondary | ICD-10-CM | POA: Diagnosis present

## 2019-12-05 DIAGNOSIS — N138 Other obstructive and reflux uropathy: Secondary | ICD-10-CM

## 2019-12-05 DIAGNOSIS — E11319 Type 2 diabetes mellitus with unspecified diabetic retinopathy without macular edema: Secondary | ICD-10-CM | POA: Diagnosis present

## 2019-12-05 DIAGNOSIS — E860 Dehydration: Secondary | ICD-10-CM

## 2019-12-05 DIAGNOSIS — R112 Nausea with vomiting, unspecified: Secondary | ICD-10-CM

## 2019-12-05 DIAGNOSIS — N401 Enlarged prostate with lower urinary tract symptoms: Secondary | ICD-10-CM

## 2019-12-05 DIAGNOSIS — E1069 Type 1 diabetes mellitus with other specified complication: Secondary | ICD-10-CM

## 2019-12-05 LAB — GLUCOSE, CAPILLARY
Glucose-Capillary: 231 mg/dL — ABNORMAL HIGH (ref 70–99)
Glucose-Capillary: 230 mg/dL — ABNORMAL HIGH (ref 70–99)
Glucose-Capillary: 255 mg/dL — ABNORMAL HIGH (ref 70–99)

## 2019-12-05 LAB — BLOOD GAS, VENOUS
Acid-base deficit: 1.1 mmol/L (ref 0.0–2.0)
Bicarbonate: 23.5 mmol/L (ref 20.0–28.0)
FIO2: 21
O2 Saturation: 95.7 %
Patient temperature: 37.2
pCO2, Ven: 39.5 mmHg — ABNORMAL LOW (ref 44.0–60.0)
pH, Ven: 7.387 (ref 7.250–7.430)
pO2, Ven: 92.3 mmHg — ABNORMAL HIGH (ref 32.0–45.0)

## 2019-12-05 LAB — RAPID URINE DRUG SCREEN, HOSP PERFORMED
Amphetamines: NOT DETECTED
Barbiturates: NOT DETECTED
Benzodiazepines: NOT DETECTED
Cocaine: NOT DETECTED
Opiates: NOT DETECTED
Tetrahydrocannabinol: NOT DETECTED

## 2019-12-05 LAB — BASIC METABOLIC PANEL
Anion gap: 7 (ref 5–15)
BUN: 23 mg/dL — ABNORMAL HIGH (ref 6–20)
CO2: 20 mmol/L — ABNORMAL LOW (ref 22–32)
Calcium: 8.2 mg/dL — ABNORMAL LOW (ref 8.9–10.3)
Chloride: 107 mmol/L (ref 98–111)
Creatinine, Ser: 1.37 mg/dL — ABNORMAL HIGH (ref 0.61–1.24)
GFR calc Af Amer: >60 mL/min (ref >60)
GFR calc non Af Amer: 56 mL/min — ABNORMAL LOW (ref >60)
Glucose, Bld: 230 mg/dL — ABNORMAL HIGH (ref 70–99)
Potassium: 4.2 mmol/L (ref 3.5–5.1)
Sodium: 134 mmol/L — ABNORMAL LOW (ref 135–145)

## 2019-12-05 LAB — HEMOGLOBIN A1C
Hgb A1c MFr Bld: 10 % — ABNORMAL HIGH (ref 4.8–5.6)
Mean Plasma Glucose: 240.3 mg/dL

## 2019-12-05 LAB — LACTIC ACID, PLASMA: Lactic Acid, Venous: 0.7 mmol/L (ref 0.5–1.9)

## 2019-12-05 LAB — SARS CORONAVIRUS 2 (TAT 6-24 HRS): SARS Coronavirus 2: NEGATIVE

## 2019-12-05 MED ORDER — INSULIN ASPART 100 UNIT/ML ~~LOC~~ SOLN
0.0000 [IU] | Freq: Three times a day (TID) | SUBCUTANEOUS | Status: DC
  Administered 2019-12-05 – 2019-12-06 (×3): 5 [IU] via SUBCUTANEOUS
  Administered 2019-12-06: 3 [IU] via SUBCUTANEOUS
  Administered 2019-12-07 (×2): 5 [IU] via SUBCUTANEOUS
  Administered 2019-12-08: 3 [IU] via SUBCUTANEOUS
  Administered 2019-12-08: 5 [IU] via SUBCUTANEOUS

## 2019-12-05 MED ORDER — ONDANSETRON HCL 4 MG/2ML IJ SOLN
4.0000 mg | Freq: Four times a day (QID) | INTRAMUSCULAR | Status: DC | PRN
  Filled 2019-12-05: qty 2

## 2019-12-05 MED ORDER — ONDANSETRON HCL 4 MG/2ML IJ SOLN
4.0000 mg | Freq: Once | INTRAMUSCULAR | Status: AC
  Administered 2019-12-05: 4 mg via INTRAVENOUS
  Filled 2019-12-05: qty 2

## 2019-12-05 MED ORDER — INSULIN NPH (HUMAN) (ISOPHANE) 100 UNIT/ML ~~LOC~~ SUSP
7.0000 [IU] | Freq: Two times a day (BID) | SUBCUTANEOUS | Status: DC
  Administered 2019-12-05 – 2019-12-06 (×2): 7 [IU] via SUBCUTANEOUS
  Filled 2019-12-05: qty 10

## 2019-12-05 MED ORDER — ONDANSETRON HCL 4 MG/2ML IJ SOLN
4.0000 mg | Freq: Three times a day (TID) | INTRAMUSCULAR | Status: DC
  Administered 2019-12-05 – 2019-12-08 (×11): 4 mg via INTRAVENOUS
  Filled 2019-12-05 (×12): qty 2

## 2019-12-05 MED ORDER — ACETAMINOPHEN 325 MG PO TABS
650.0000 mg | ORAL_TABLET | Freq: Four times a day (QID) | ORAL | Status: DC | PRN
  Administered 2019-12-06 – 2019-12-08 (×2): 650 mg via ORAL
  Filled 2019-12-05 (×2): qty 2

## 2019-12-05 MED ORDER — LORAZEPAM 2 MG/ML IJ SOLN
1.0000 mg | Freq: Once | INTRAMUSCULAR | Status: AC
  Administered 2019-12-05: 1 mg via INTRAVENOUS
  Filled 2019-12-05: qty 1

## 2019-12-05 MED ORDER — ONDANSETRON HCL 4 MG PO TABS
4.0000 mg | ORAL_TABLET | Freq: Four times a day (QID) | ORAL | Status: DC | PRN
  Administered 2019-12-07: 4 mg via ORAL

## 2019-12-05 MED ORDER — LABETALOL HCL 5 MG/ML IV SOLN
INTRAVENOUS | Status: AC
  Filled 2019-12-05: qty 4

## 2019-12-05 MED ORDER — ENOXAPARIN SODIUM 40 MG/0.4ML ~~LOC~~ SOLN
40.0000 mg | SUBCUTANEOUS | Status: DC
  Administered 2019-12-05 – 2019-12-06 (×2): 40 mg via SUBCUTANEOUS
  Filled 2019-12-05 (×2): qty 0.4

## 2019-12-05 MED ORDER — TAMSULOSIN HCL 0.4 MG PO CAPS
0.4000 mg | ORAL_CAPSULE | Freq: Every day | ORAL | Status: DC
  Administered 2019-12-05 – 2019-12-08 (×4): 0.4 mg via ORAL
  Filled 2019-12-05 (×4): qty 1

## 2019-12-05 MED ORDER — ACETAMINOPHEN 650 MG RE SUPP: 650.0000 mg | Freq: Four times a day (QID) | RECTAL | Status: DC | PRN

## 2019-12-05 MED ORDER — FINASTERIDE 5 MG PO TABS
5.0000 mg | ORAL_TABLET | Freq: Every day | ORAL | Status: DC
Start: 2019-12-05 — End: 2019-12-08
  Administered 2019-12-05 – 2019-12-08 (×4): 5 mg via ORAL
  Filled 2019-12-05 (×4): qty 1

## 2019-12-05 MED ORDER — METOCLOPRAMIDE HCL 10 MG PO TABS: 10.0000 mg | ORAL_TABLET | Freq: Three times a day (TID) | ORAL | 0 refills | Status: DC | PRN

## 2019-12-05 MED ORDER — PROMETHAZINE HCL 25 MG PO TABS: 25.0000 mg | ORAL_TABLET | Freq: Four times a day (QID) | ORAL | 0 refills | Status: DC | PRN

## 2019-12-05 MED ORDER — LABETALOL HCL 5 MG/ML IV SOLN
10.0000 mg | Freq: Once | INTRAVENOUS | Status: AC
  Administered 2019-12-05: 10 mg via INTRAVENOUS

## 2019-12-05 MED ORDER — SODIUM CHLORIDE 0.9 % IV SOLN: INTRAVENOUS | Status: DC

## 2019-12-05 MED ORDER — INSULIN ASPART 100 UNIT/ML ~~LOC~~ SOLN
0.0000 [IU] | Freq: Every day | SUBCUTANEOUS | Status: DC
  Administered 2019-12-05: 3 [IU] via SUBCUTANEOUS

## 2019-12-05 NOTE — ED Notes (Signed)
Pt tolerating oral fluids at this time.  °

## 2019-12-05 NOTE — ED Provider Notes (Signed)
Care assumed from Dr. Lynelle Doctor.  Diabetic here with right-sided abdominal pain and nausea and vomiting.  Recent visit at outside hospital with negative CT scan, Foley catheter placed and treated with Bactrim for UTI.  Patient awaiting symptom control with suspicion for gastroparesis.  He is given IV fluids antiemetics.  Chemistry shows normal anion gap and bicarb of 20. UA with small ketones and hematuria.  Possibility of passed kidney stone though none seen on x-ray today and CT scan yesterday.  Patient with persistent nausea given dose of Haldol and Ativan as well as Zofran.  No further vomiting. UA with small ketones. Does not appear to be in DKA with normal anion gap.   Will treat suspected gastroparesis with reglan, antiemetics and GI followup.  On attempted discharge, patient with nausea and dry heaves. Patient and wife concerned about going home. Will plan for observation admission.  hospitalist call pending at shift change.   Chad Octave, MD 12/05/19 0900

## 2019-12-05 NOTE — Consult Note (Addendum)
Referring Provider: No ref. provider found Primary Care Physician:  Alpha Gula, FNP Primary Gastroenterologist:  Jonette Eva  Reason for Consultation:  NAUSEA/VOMITING   Impression: ADMITTED WITH NAUSEA/VOMITING. DIFFERENTIAL DIAGNOSIS INCLUDES: GASTROPARESIS, PYLORIC STENOSIS, H PYLORI GASTRITIS, LESS LIKELY UNCONTROLLED GERD, GE JUNCTION TUMOR, GASTRIC OR PANCREATIC CA OR CHRONIC MESENTERIC ISCHEMIA  Plan: 1. CONTINUE FOLEY.  2. ZOFRAN ATC AND PRN. 3. CLEAR LIQUID DIET. 4. AWAIT [place]. CONSIDER CT ABD/PELVIS. 5. PLAN EGD/?PYLORIC OR ESOPHAGEAL DILATION ON MON APR 19.      HPI:  HAS NAUSEA/VOMTIING ABOUT A YEAR AGO. GAVE HIM A FOLEY AND TOLD HE HAD URINARY RETENTION. PUT HIM ON FLOMAX AND SYMPTOMS GOT BETTER UNTIL NOW. FOLEY CAME OUT. WAS I&O CATH FOR PAST YEAR: 1-2X/MO. SYMPTOMS GOT WORSE 2 WEEKS XBD:ZHGDJM PRESSURE FOR ONE WEEK THEN NAUSEA AND VOMITING STARTED  LAST SAT, VOMITING 20X/DAY(NO BLOOD). SEEN IN ED ON WED AND PUT FOLEY BACK IN. WIFE/PT FEELS  LIKE PRESSURE RELIEVED BUT NV CONTINUES.  SUBJECTIVE CHILLS. HAS LOWER ABDOMEN: PRESSURE, DULL ACHE, AND CRAMPING. CAN HAVE MILD SORE THROAT AND ODYNOPHAGIA FOR PAST WEEK AFTER THROWING UP SO MUCH. WATER FEELS LIKE IT GETS STUCK SOMETIMES. CHICKEN/RIC WENT DOWN AND IT CAME BACK UP. DRANK ENSURE AND IT DID THE SAME THING. TAKING ASA EVERY DAY. USING NAPROXEN PRN(OVER A YEAR). LAST TCS: > 5 YRS. EGD REMOTE-VOMITING. ETOH OCCASIONAL. NICOTINE: NO. HAD BLADDER SCAN IN [facility] ED. [place] PENDING.  PT DENIES FEVER, CHILLS, HEMATOCHEZIA, HEMATEMESIS, nausea, vomiting, melena, diarrhea, CHEST PAIN, SHORTNESS OF BREATH, CHANGE IN BOWEL IN HABITS, constipation, problems with sedation, OR heartburn or indigestion.     Past Medical History:  Diagnosis Date  . Diabetes mellitus without complication [facility])     Past Surgical History:  Procedure Laterality Date  . HERNIA REPAIR Left     Prior to Admission medications   Medication Sig Start Date End  Date Taking? Authorizing Provider  aspirin EC 81 MG tablet Take 1 tablet by mouth daily.   Yes [provider]  finasteride (PROSCAR) 5 MG tablet Take 1 tablet by mouth daily. 11/19/19  Yes [provider]  insulin NPH Human (HUMULIN N,NOVOLIN N) 100 UNIT/ML injection Inject 8-10 Units into the skin 2 (two) times daily. Inject 15 units under the skin 30 min before breakfast. Inject 12 units under the skin 30 min before supper. 10/13/15  Yes [provider]  insulin regular (NOVOLIN R,HUMULIN R) 100 units/mL injection Inject 8-10 Units into the skin See admin instructions. Inject 8 units under the skin 30 min before each meal. Take 4 units for a small meal and 12 units for a large meal. 10/13/15  Yes [provider]  naproxen sodium (ALEVE) 220 MG tablet Take 220 mg by mouth daily as needed (pain).   Yes [provider]  tamsulosin (FLOMAX) 0.4 MG CAPS capsule Take 1 capsule by mouth daily. 05/25/19  Yes [provider]  HYDROcodone-acetaminophen (NORCO/VICODIN) 5-325 MG tablet Take 1 tablet by mouth every 4 (four) hours as needed. Patient not taking: Reported on 12/05/2019 10/11/18   Burgess Amor, PA-C  metoCLOPramide (REGLAN) 10 MG tablet Take 1 tablet (10 mg total) by mouth every 8 (eight) hours as needed for nausea. 12/05/19   Rancour, Jeannett Senior, MD  promethazine (PHENERGAN) 25 MG tablet Take 1 tablet (25 mg total) by mouth every 6 (six) hours as needed for nausea or vomiting. 12/05/19   Glynn Octave, MD    Allergies as of 12/04/2019  . (No Known Allergies)  Family History  Problem Relation Age of Onset  . Colon cancer Neg Hx   . Colon polyps Neg Hx    Social History   Socioeconomic History  . Marital status: Married    Spouse name: Not on file  . Number of children: Not on file  . Years of education: Not on file  . Highest education level: Not on file  Occupational History  . Not on file  Tobacco Use  . Smoking status: Former Research scientist (life sciences)   . Smokeless tobacco: Never Used  Substance and Sexual Activity  . Alcohol use: Yes    Comment: daily  . Drug use: No  . Sexual activity: Not on file  Other Topics Concern  . Not on file  Social History Narrative  . Not on file   Social Determinants of Health   Financial Resource Strain:   . Difficulty of Paying Living Expenses:   Food Insecurity:   . Worried About Charity fundraiser in the Last Year:   . Arboriculturist in the Last Year:   Transportation Needs:   . Film/video editor (Medical):   Marland Kitchen Lack of Transportation (Non-Medical):   Physical Activity:   . Days of Exercise per Week:   . Minutes of Exercise per Session:   Stress:   . Feeling of Stress :   Social Connections:   . Frequency of Communication with Friends and Family:   . Frequency of Social Gatherings with Friends and Family:   . Attends Religious Services:   . Active Member of Clubs or Organizations:   . Attends Archivist Meetings:   Marland Kitchen Marital Status:   Intimate Partner Violence:   . Fear of Current or Ex-Partner:   . Emotionally Abused:   Marland Kitchen Physically Abused:   . Sexually Abused:     Review of Systems: PER HPI OTHERWISE ALL SYSTEMS ARE NEGATIVE.   Vitals: Blood pressure (!) 182/118, pulse 94, temperature 98.2 F (36.8 C), temperature source Oral, resp. rate 17, height 5\' 10"  (1.778 m), weight 78.5 kg, SpO2 97 %.  Physical Exam: General:   Alert,  Well-developed, well-nourished, pleasant and cooperative in NAD Head:  Normocephalic and atraumatic. Eyes:  Sclera clear, no icterus.   Conjunctiva pink. Mouth:  No lesions, dentition normal. Neck:  Supple; no masses. Lungs:  Clear throughout to auscultation.   No wheezes. No acute distress. Heart:  Regular rate and rhythm; no murmurs, clicks, rubs,  or gallops. Abdomen:  Soft, nontender and nondistended. No masses, hepatosplenomegaly or hernias noted. Normal bowel sounds, without guarding, and without rebound.   Msk:  Symmetrical  without gross deformities. Normal posture. Extremities:  Without edema. Neurologic:  Alert and  oriented x4;  grossly normal neurologically. Cervical Nodes:  No significant cervical adenopathy. Psych:  Alert and cooperative. Normal mood and affect.   Lab Results: Recent Labs    12/04/19 2142  WBC 9.5  HGB 14.5  HCT 46.6  PLT 260   BMET Recent Labs    12/04/19 2142 12/05/19 0320  NA 130* 134*  K 4.5 4.2  CL 99 107  CO2 20* 20*  GLUCOSE 396* 230*  BUN 28* 23*  CREATININE 1.50* 1.37*  CALCIUM 8.8* 8.2*   LFT Recent Labs    12/04/19 2142  PROT 7.3  ALBUMIN 3.5  AST 17  ALT 18  ALKPHOS 60  BILITOT 1.3*     Studies/Results:   LOS: 0 days   Kharee Lesesne  12/05/2019, 11:21 AM

## 2019-12-05 NOTE — ED Provider Notes (Addendum)
Blood pressure 119/75, pulse 84, temperature 98.2 F (36.8 C), temperature source Oral, resp. rate 17, height 5\' 10"  (1.778 m), weight 78.5 kg, SpO2 99 %.  Assuming care from Dr. .  In short, Chad Kirk is a 59 y.o. male with a chief complaint of Emesis .  Refer to the original H&P for additional details.  The current plan of care is to f/u with [facility] regarding admission.  08:15 AM  Discussed patient's case with [facility], Dr. [facility] to request admission. Patient and family (if present) updated with plan. Care transferred to [facility] service.  I reviewed all nursing notes, vitals, pertinent old records, EKGs, labs, imaging (as available).   EKG Interpretation  Date/Time:  Saturday December 05 2019 08:43:27 EDT Ventricular Rate:  92 PR Interval:    QRS Duration: 96 QT Interval:  361 QTC Calculation: 447 R Axis:   -2 Text Interpretation: Sinus rhythm No STEMI Confirmed by [id number] [phone]) on 12/05/2019 8:45:20 AM         Chad Kirk, 12/07/2019, MD 12/05/19 12/07/19    [id number], MD 12/05/19 [phone]

## 2019-12-05 NOTE — H&P (Signed)
History and Physical    Chad Kirk ZDG:[id number] DOB: [birth year] DOA: 12/04/2019  PCP: Judy Pimple, FNP  Patient coming from: Home  I have personally briefly reviewed patient's old medical records in [place]  Chief Complaint: Persistent nausea and vomiting.  HPI: Chad Kirk is a 59 y.o. male with medical history significant of insulin-dependent diabetes, BPH with bladder outlet obstruction, presents to the hospital with complaints of abdominal pain with nausea and vomiting.  Reports that for the past 2 weeks, he has had right-sided abdominal pain which radiates to his left.  He initially felt that this discomfort was related to possibly an over distended bladder and he began to self catheterize at home.  Initially, abdominal pain did improve, but then persisted.  Over the past week, he has had persistent nausea and vomiting.  This is worse with any p.o. intake.  He reports swallowing approximately 20 times a day.  His bowel movements have otherwise been normal.  He is not had a fever.  No sick contacts.  He denies any dysuria.  He was recently evaluated at [facility] on 4/13 for similar symptoms.  He had a CT scan done at that time that did not show any acute findings other than a distended bladder.  Foley catheter was placed in the emergency room and arrangements were made for outpatient urology follow-up.  It was also felt that he had a urinary tract infection placement was discharged on Bactrim.  Per patient, it was felt that his UTI was causing his nausea and vomiting.  Patient was unable to keep his antibiotics down at home.  Since his symptoms have persisted, he is presented to the emergency room today for evaluation.  ED Course: Vitals were noted to be stable.  Since he had a CT scan done within the past 3 days, this was not repeated.  He did have an acute abdominal series x-ray films that were unrevealing.  Lipase and liver enzymes were also unremarkable.  Per glycemia on arrival  with a serum glucose of 396, but anion gap was 11 and serum bicarb of 20.  Creatinine mildly elevated at 1.5.  This improved overnight with IV fluids down to baseline of 1.3.  Lactic acid was noted to be normal.  Urinalysis did not show any evidence of pyuria.  Patient received several rounds of antiemetics, but symptoms persist.  He is not felt stable to discharge home at this time.  Review of Systems: As per HPI otherwise 10 point review of systems negative.    Past Medical History:  Diagnosis Date  . Diabetes mellitus without complication [facility])     Past Surgical History:  Procedure Laterality Date  . HERNIA REPAIR Left     Social History:  reports that he has quit smoking. He has never used smokeless tobacco. He reports current alcohol use. He reports that he does not use drugs.  No Known Allergies  Family history: Family history reviewed and not pertinent  Prior to Admission medications   Medication Sig Start Date End Date Taking? Authorizing Provider  aspirin EC 81 MG tablet Take 1 tablet by mouth daily.   Yes [provider]  finasteride (PROSCAR) 5 MG tablet Take 1 tablet by mouth daily. 11/19/19  Yes [provider]  insulin NPH Human (HUMULIN N,NOVOLIN N) 100 UNIT/ML injection Inject 8-10 Units into the skin 2 (two) times daily. Inject 15 units under the skin 30 min before breakfast. Inject 12 units under the skin 30  min before supper. 10/13/15  Yes [provider]  insulin regular (NOVOLIN R,HUMULIN R) 100 units/mL injection Inject 8-10 Units into the skin See admin instructions. Inject 8 units under the skin 30 min before each meal. Take 4 units for a small meal and 12 units for a large meal. 10/13/15  Yes [provider]  naproxen sodium (ALEVE) 220 MG tablet Take 220 mg by mouth daily as needed (pain).   Yes [provider]  tamsulosin (FLOMAX) 0.4 MG CAPS capsule Take 1 capsule by mouth daily. 05/25/19  Yes [provider]    HYDROcodone-acetaminophen (NORCO/VICODIN) 5-325 MG tablet Take 1 tablet by mouth every 4 (four) hours as needed. Patient not taking: Reported on 12/05/2019 10/11/18   Burgess Amor, PA-C  metoCLOPramide (REGLAN) 10 MG tablet Take 1 tablet (10 mg total) by mouth every 8 (eight) hours as needed for nausea. 12/05/19   Rancour, Jeannett Senior, MD  promethazine (PHENERGAN) 25 MG tablet Take 1 tablet (25 mg total) by mouth every 6 (six) hours as needed for nausea or vomiting. 12/05/19   Glynn Octave, MD    Physical Exam: Vitals:   12/04/19 2049 12/04/19 2050 12/05/19 0415 12/05/19 0619  BP: 97/72  112/76 119/75  Pulse: (!) 102  86 84  Resp: 16  18 17   Temp: 97.7 F (36.5 C)  98.2 F (36.8 C)   TempSrc: Oral  Oral   SpO2: 100%  98% 99%  Weight:  78.5 kg    Height:  5\' 10"  (1.778 m)      Constitutional: NAD, calm, comfortable Eyes: PERRL, lids and conjunctivae normal ENMT: Mucous membranes are moist. Posterior pharynx clear of any exudate or lesions.Normal dentition.  Neck: normal, supple, no masses, no thyromegaly Respiratory: clear to auscultation bilaterally, no wheezing, no crackles. Normal respiratory effort. No accessory muscle use.  Cardiovascular: Regular rate and rhythm, no murmurs / rubs / gallops. No extremity edema. 2+ pedal pulses. No carotid bruits.  Abdomen: no tenderness, no masses palpated. No hepatosplenomegaly. Bowel sounds positive.  Musculoskeletal: no clubbing / cyanosis. No joint deformity upper and lower extremities. Good ROM, no contractures. Normal muscle tone.  Skin: no rashes, lesions, ulcers. No induration Neurologic: CN 2-12 grossly intact. Sensation intact, DTR normal. Strength 5/5 in all 4.  Psychiatric: Normal judgment and insight. Alert and oriented x 3. Normal mood.    Labs on Admission: I have personally reviewed following labs and imaging studies  CBC: Recent Labs  Lab 12/04/19 2142  WBC 9.5  HGB 14.5  HCT 46.6  MCV 84.1  PLT 260   Basic Metabolic  Panel: Recent Labs  Lab 12/04/19 2142 12/05/19 0320  NA 130* 134*  K 4.5 4.2  CL 99 107  CO2 20* 20*  GLUCOSE 396* 230*  BUN 28* 23*  CREATININE 1.50* 1.37*  CALCIUM 8.8* 8.2*   GFR: Estimated Creatinine Clearance: 59.9 mL/min (A) (by C-G formula based on SCr of 1.37 mg/dL (H)). Liver Function Tests: Recent Labs  Lab 12/04/19 2142  AST 17  ALT 18  ALKPHOS 60  BILITOT 1.3*  PROT 7.3  ALBUMIN 3.5   Recent Labs  Lab 12/04/19 2142  LIPASE 33   No results for input(s): AMMONIA in the last 168 hours. Coagulation Profile: No results for input(s): INR, PROTIME in the last 168 hours. Cardiac Enzymes: No results for input(s): CKTOTAL, CKMB, CKMBINDEX, TROPONINI in the last 168 hours. BNP (last 3 results) No results for input(s): PROBNP in the last 8760 hours. HbA1C: No results  for input(s): HGBA1C in the last 72 hours. CBG: No results for input(s): GLUCAP in the last 168 hours. Lipid Profile: No results for input(s): CHOL, HDL, LDLCALC, TRIG, CHOLHDL, LDLDIRECT in the last 72 hours. Thyroid Function Tests: No results for input(s): TSH, T4TOTAL, FREET4, T3FREE, THYROIDAB in the last 72 hours. Anemia Panel: No results for input(s): VITAMINB12, FOLATE, FERRITIN, TIBC, IRON, RETICCTPCT in the last 72 hours. Urine analysis:    Component Value Date/Time   COLORURINE YELLOW 12/04/2019 2051   APPEARANCEUR CLEAR 12/04/2019 2051   LABSPEC 1.025 12/04/2019 2051   PHURINE 6.0 12/04/2019 2051   GLUCOSEU >=500 (A) 12/04/2019 2051   HGBUR MODERATE (A) 12/04/2019 2051   BILIRUBINUR NEGATIVE 12/04/2019 2051   KETONESUR 20 (A) 12/04/2019 2051   PROTEINUR 100 (A) 12/04/2019 2051   NITRITE NEGATIVE 12/04/2019 2051   LEUKOCYTESUR NEGATIVE 12/04/2019 2051    Radiological Exams on Admission: DG Abd Acute W/Chest  Result Date: 12/04/2019 CLINICAL DATA:  Nausea vomiting for several days EXAM: DG ABDOMEN ACUTE W/ 1V CHEST COMPARISON:  None. FINDINGS: Cardiac shadows within normal  limits. The lungs are well aerated bilaterally. Bilateral nipple shadows are noted. No bony abnormality is seen. Scattered large and small bowel gas is noted. No free air is seen. No abnormal mass or abnormal calcifications are noted. No bony abnormality is seen. IMPRESSION: No acute abnormality is noted in the chest and abdomen. Electronically Signed   By: Alcide Clever M.D.   On: 12/04/2019 22:16    EKG: Independently reviewed.  Sinus rhythm without acute changes  Assessment/Plan Active Problems:   Intractable vomiting   BPH with obstruction/lower urinary tract symptoms   Type 1 diabetes mellitus with other specified complication (HCC)   Dehydration   Diabetic retinopathy (HCC)     1. Intractable nausea and vomiting.  Etiology is not entirely clear.  Recent CT scan imaging was unrevealing.  Since he is having right-sided abdominal pain, will check right upper quadrant ultrasound.  Will request gastroenterology input.  Gastroparesis may be playing a role.  We will keep on clear liquids for now.  Continue antiemetics. 2. Insulin-dependent diabetes, uncontrolled with hyperglycemia.  Patient does report having episodes of hypoglycemia at home.  He is on NPH insulin as well as regular insulin.  Since p.o. intake is currently reduced, will reduce NPH insulin by 50%.  Start on sliding scale insulin.  Check A1c. 3. Dehydration.  Continue IV fluids 4. BPH with obstruction.  Continue Foley catheter for bladder decompression.  He will follow-up with urology.  Continue on Flomax and finasteride. 5. Diabetic retinopathy.  Follow-up scheduled with ophthalmology.  DVT prophylaxis: Lovenox Code Status: Full code Family Communication: Discussed with spouse at the bedside Disposition Plan: Discharge home once able to tolerate p.o. intake and vomiting has resolved Consults called: Gastroenterology Admission status: Observation, MedSurg  Erick Blinks MD [facility]   If 7PM-7AM, please contact  night-coverage [email or web address]   12/05/2019, 10:35 AM

## 2019-12-05 NOTE — ED Notes (Signed)
Report to [employer], 3rd floor

## 2019-12-06 DIAGNOSIS — N179 Acute kidney failure, unspecified: Secondary | ICD-10-CM | POA: Diagnosis present

## 2019-12-06 DIAGNOSIS — I10 Essential (primary) hypertension: Secondary | ICD-10-CM

## 2019-12-06 LAB — COMPREHENSIVE METABOLIC PANEL
ALT: 14 U/L (ref 0–44)
AST: 14 U/L — ABNORMAL LOW (ref 15–41)
Albumin: 3 g/dL — ABNORMAL LOW (ref 3.5–5.0)
Alkaline Phosphatase: 53 U/L (ref 38–126)
Anion gap: 9 (ref 5–15)
BUN: 16 mg/dL (ref 6–20)
CO2: 23 mmol/L (ref 22–32)
Calcium: 8.5 mg/dL — ABNORMAL LOW (ref 8.9–10.3)
Chloride: 103 mmol/L (ref 98–111)
Creatinine, Ser: 1.18 mg/dL (ref 0.61–1.24)
GFR calc Af Amer: >60 mL/min (ref >60)
GFR calc non Af Amer: >60 mL/min (ref >60)
Glucose, Bld: 234 mg/dL — ABNORMAL HIGH (ref 70–99)
Potassium: 4 mmol/L (ref 3.5–5.1)
Sodium: 135 mmol/L (ref 135–145)
Total Bilirubin: 1.2 mg/dL (ref 0.3–1.2)
Total Protein: 6.5 g/dL (ref 6.5–8.1)

## 2019-12-06 LAB — CBC
HCT: 43.3 % (ref 39.0–52.0)
Hemoglobin: 13.1 g/dL (ref 13.0–17.0)
MCH: 26.1 pg (ref 26.0–34.0)
MCHC: 30.3 g/dL (ref 30.0–36.0)
MCV: 86.3 fL (ref 80.0–100.0)
Platelets: 225 10*3/uL (ref 150–400)
RBC: 5.02 MIL/uL (ref 4.22–5.81)
RDW: 12.9 % (ref 11.5–15.5)
WBC: 6 10*3/uL (ref 4.0–10.5)
nRBC: 0 % (ref 0.0–0.2)

## 2019-12-06 LAB — GLUCOSE, CAPILLARY
Glucose-Capillary: 336 mg/dL — ABNORMAL HIGH (ref 70–99)
Glucose-Capillary: 244 mg/dL — ABNORMAL HIGH (ref 70–99)
Glucose-Capillary: 134 mg/dL — ABNORMAL HIGH (ref 70–99)

## 2019-12-06 LAB — HIV ANTIBODY (ROUTINE TESTING W REFLEX): HIV Screen 4th Generation wRfx: NONREACTIVE

## 2019-12-06 MED ORDER — METOPROLOL TARTRATE 5 MG/5ML IV SOLN
5.0000 mg | Freq: Four times a day (QID) | INTRAVENOUS | Status: DC | PRN
  Administered 2019-12-06 – 2019-12-08 (×3): 5 mg via INTRAVENOUS
  Filled 2019-12-06 (×3): qty 5

## 2019-12-06 MED ORDER — ENOXAPARIN SODIUM 40 MG/0.4ML ~~LOC~~ SOLN: 40.0000 mg | SUBCUTANEOUS | Status: DC

## 2019-12-06 MED ORDER — METOPROLOL TARTRATE 25 MG PO TABS
25.0000 mg | ORAL_TABLET | Freq: Two times a day (BID) | ORAL | Status: DC
  Administered 2019-12-06 – 2019-12-08 (×5): 25 mg via ORAL
  Filled 2019-12-06 (×5): qty 1

## 2019-12-06 NOTE — Plan of Care (Signed)
  Problem: Education: Goal: Knowledge of General Education information will improve Description Including pain rating scale, medication(s)/side effects and non-pharmacologic comfort measures Outcome: Progressing   Problem: Health Behavior/Discharge Planning: Goal: Ability to manage health-related needs will improve Outcome: Progressing   

## 2019-12-06 NOTE — Progress Notes (Addendum)
   Assessment/Plan: ADMITTED WITH NAUSEA AND VOMITING. CLINICALLY IMPROVED.   PLAN: 1. EGD/?DIL APR 19.  PHENERGAN 6.25 MG IV IN PREOP. DISCUSSED PROCEDURE, BENEFITS, & RISKS: < 1% chance of medication reaction, bleeding, perforation, or ASPIRATION. 2. PROTONIX BID 3. ZOFRAN ATC. 4. HOLD LOVENOX.   Subjective: Since I last evaluated the patient HIS NAUSEA AND VOMITING HAVE RESOLVED. HE HAS MILD LOWER ABDOMINAL PRESSURE. FOLEY IN PLACE.  Objective: Vital signs in last 24 hours: Vitals:   12/06/19 0553 12/06/19 0616  BP: (!) 205/115 (!) 181/118  Pulse: 88 85  Resp: 20   Temp: 98.4 F (36.9 C)   SpO2: 100%    General appearance: alert, cooperative and no distress Resp: clear to auscultation bilaterally Cardio: regular rate and rhythm GI: soft, non-tender; bowel sounds normal;   Lab Results:  K 4.0 Cr 1.18 Hb 13.1   Studies/Results: [place] APR 17: NL GB AND CBD, NO GALLSTONES  Medications: I have reviewed the patient's current medications.

## 2019-12-06 NOTE — Progress Notes (Addendum)
PROGRESS NOTE    Ventura Hollenbeck  IRJ:[id number] DOB: 1961/03/16 DOA: 12/04/2019 PCP: Alpha Gula, FNP    Brief Narrative:  59 year old male with history of hypertension diabetes, was admitted to the hospital with intractable nausea and vomiting.  CT scan and abdominal ultrasound were unrevealing.  He was noted to be dehydrated.  He was started on IV fluids.  He was seen by GI and plans are for EGD on 4/19.   Assessment & Plan:   Active Problems:   Non-intractable vomiting   BPH with obstruction/lower urinary tract symptoms   Type 1 diabetes mellitus with other specified complication (HCC)   Dehydration   Diabetic retinopathy (HCC)   Intractable vomiting   Essential hypertension   AKI (acute kidney injury) (HCC)   1. Intractable nausea and vomiting.  Etiology is not entirely clear.  Recent CT scan imaging on 4/13 was unrevealing.    Right upper quadrant ultrasound also unrevealing.    Gastroenterology consulted.  Plans are for EGD in a.m.  Continue clear liquids and antiemetics.. 2. Insulin-dependent diabetes, uncontrolled with hyperglycemia.  Patient does report having episodes of hypoglycemia at home.  He is on NPH insulin as well as regular insulin. Continue on sliding scale insulin.    A1c 10.0 3. Acute kidney injury.  Secondary to dehydration.  Improved with fluids. 4. BPH with obstruction.  Continue Foley catheter for bladder decompression.  He will follow-up with urology.  Continue on Flomax and finasteride. 5. Diabetic retinopathy.  Follow-up scheduled with ophthalmology. 6. Hypertension.  Start on metoprolol.  Continue to follow blood pressure.   DVT prophylaxis: lovenox Code Status: full code Family Communication: discussed with wife at the bedside Disposition Plan: Status is: Inpatient  Remains inpatient appropriate because:Ongoing diagnostic testing needed not appropriate for outpatient work up.  Patient will undergo EGD in AM.   Dispo: The patient is from: Home            Anticipated d/c is to: Home              Anticipated d/c date is: 1 day              Patient currently is not medically stable to d/c.    Consultants:   Gastroenterology  Procedures:     Antimicrobials:       Subjective: Feels a little better.  Started tolerating clear liquids.  Patient is having some soreness in his right abdomen  Objective: Vitals:   12/06/19 0007 12/06/19 0553 12/06/19 0616 12/06/19 1439  BP: 129/85 (!) 205/115 (!) 181/118 (!) 125/93  Pulse: 84 88 85 85  Resp: 20 20  18   Temp: 98.1 F (36.7 C) 98.4 F (36.9 C)    TempSrc: Oral Oral    SpO2: 100% 100%  100%  Weight:      Height:        Intake/Output Summary (Last 24 hours) at 12/06/2019 1845 Last data filed at 12/06/2019 1457 Gross per 24 hour  Intake 720 ml  Output 3100 ml  Net -2380 ml   Filed Weights   12/04/19 2050  Weight: 78.5 kg    Examination:  General exam: Appears calm and comfortable  Respiratory system: Clear to auscultation. Respiratory effort normal. Cardiovascular system: S1 & S2 heard, RRR. No JVD, murmurs, rubs, gallops or clicks. No pedal edema. Gastrointestinal system: Abdomen is nondistended, soft and nontender. No organomegaly or masses felt. Normal bowel sounds heard. Central nervous system: Alert and oriented. No focal neurological deficits. Extremities: Symmetric  5 x 5 power. Skin: No rashes, lesions or ulcers Psychiatry: Judgement and insight appear normal. Mood & affect appropriate.     Data Reviewed: I have personally reviewed following labs and imaging studies  CBC: Recent Labs  Lab 12/04/19 2142 12/06/19 0556  WBC 9.5 6.0  HGB 14.5 13.1  HCT 46.6 43.3  MCV 84.1 86.3  PLT 260 225   Basic Metabolic Panel: Recent Labs  Lab 12/04/19 2142 12/05/19 0320 12/06/19 0556  NA 130* 134* 135  K 4.5 4.2 4.0  CL 99 107 103  CO2 20* 20* 23  GLUCOSE 396* 230* 234*  BUN 28* 23* 16  CREATININE 1.50* 1.37* 1.18  CALCIUM 8.8* 8.2* 8.5*    GFR: Estimated Creatinine Clearance: 69.6 mL/min (by C-G formula based on SCr of 1.18 mg/dL). Liver Function Tests: Recent Labs  Lab 12/04/19 2142 12/06/19 0556  AST 17 14*  ALT 18 14  ALKPHOS 60 53  BILITOT 1.3* 1.2  PROT 7.3 6.5  ALBUMIN 3.5 3.0*   Recent Labs  Lab 12/04/19 2142  LIPASE 33   No results for input(s): AMMONIA in the last 168 hours. Coagulation Profile: No results for input(s): INR, PROTIME in the last 168 hours. Cardiac Enzymes: No results for input(s): CKTOTAL, CKMB, CKMBINDEX, TROPONINI in the last 168 hours. BNP (last 3 results) No results for input(s): PROBNP in the last 8760 hours. HbA1C: Recent Labs    12/05/19 0320  HGBA1C 10.0*   CBG: Recent Labs  Lab 12/05/19 1130 12/05/19 1659 12/05/19 2056 12/06/19 1055 12/06/19 1636  GLUCAP 231* 230* 255* 336* 244*   Lipid Profile: No results for input(s): CHOL, HDL, LDLCALC, TRIG, CHOLHDL, LDLDIRECT in the last 72 hours. Thyroid Function Tests: No results for input(s): TSH, T4TOTAL, FREET4, T3FREE, THYROIDAB in the last 72 hours. Anemia Panel: No results for input(s): VITAMINB12, FOLATE, FERRITIN, TIBC, IRON, RETICCTPCT in the last 72 hours. Sepsis Labs: Recent Labs  Lab 12/05/19 0320  LATICACIDVEN 0.7    Recent Results (from the past 240 hour(s))  SARS CORONAVIRUS 2 (TAT 6-24 HRS) Nasopharyngeal Nasopharyngeal Swab     Status: None   Collection Time: 12/05/19  7:44 AM   Specimen: Nasopharyngeal Swab  Result Value Ref Range Status   SARS Coronavirus 2 NEGATIVE NEGATIVE Final    Comment: (NOTE) SARS-CoV-2 target nucleic acids are NOT DETECTED. The SARS-CoV-2 RNA is generally detectable in upper and lower respiratory specimens during the acute phase of infection. Negative results do not preclude SARS-CoV-2 infection, do not rule out co-infections with other pathogens, and should not be used as the sole basis for treatment or other patient management decisions. Negative results must be  combined with clinical observations, patient history, and epidemiological information. The expected result is Negative. Fact Sheet for Patients: HairSlick.no Fact Sheet for Healthcare Providers: quierodirigir.com This test is not yet approved or cleared by the [place] FDA and  has been authorized for detection and/or diagnosis of SARS-CoV-2 by FDA under an Emergency Use Authorization (EUA). This EUA will remain  in effect (meaning this test can be used) for the duration of the COVID-19 declaration under Section 56 4(b)(1) of the Act, 21 U.S.C. section 360bbb-3(b)(1), unless the authorization is terminated or revoked sooner. Performed at [facility], [address] [place], [place] [address]          Radiology Studies: DG Abd Acute W/Chest  Result Date: 12/04/2019 CLINICAL DATA:  Nausea vomiting for several days EXAM: DG ABDOMEN ACUTE W/ 1V CHEST COMPARISON:  None. FINDINGS: Cardiac shadows within normal limits. The lungs are well aerated bilaterally. Bilateral nipple shadows are noted. No bony abnormality is seen. Scattered large and small bowel gas is noted. No free air is seen. No abnormal mass or abnormal calcifications are noted. No bony abnormality is seen. IMPRESSION: No acute abnormality is noted in the chest and abdomen. Electronically Signed   By: Inez Catalina M.D.   On: 12/04/2019 22:16   US Abdomen Limited RUQ  Result Date: 12/05/2019 CLINICAL DATA:  Right upper quadrant abdominal pain for 3 weeks with vomiting EXAM: ULTRASOUND ABDOMEN LIMITED RIGHT UPPER QUADRANT COMPARISON:  CT abdomen 12/02/2019 FINDINGS: Gallbladder: No gallstones or wall thickening visualized. No sonographic Murphy sign noted by sonographer. Common bile duct: Diameter: 0.5 cm Liver: No focal lesion identified. Within normal limits in parenchymal echogenicity. Portal vein is patent on color Doppler imaging with normal direction of blood  flow towards the liver. Other: None. IMPRESSION: No significant abnormality identified. Electronically Signed   By: Van Clines M.D.   On: 12/05/2019 12:54        Scheduled Meds: . [START ON 12/08/2019] enoxaparin (LOVENOX) injection  40 mg Subcutaneous Q24H  . finasteride  5 mg Oral Daily  . insulin aspart  0-15 Units Subcutaneous TID WC  . insulin aspart  0-5 Units Subcutaneous QHS  . metoprolol tartrate  25 mg Oral BID  . ondansetron (ZOFRAN) IV  4 mg Intravenous TID WC & HS  . tamsulosin  0.4 mg Oral Daily   Continuous Infusions: . sodium chloride 100 mL/hr at 12/06/19 0432     LOS: 1 day    Time spent: 87mins    Kathie Dike, MD [facility]   If 7PM-7AM, please contact night-coverage [email or web address]  12/06/2019, 6:45 PM

## 2019-12-07 ENCOUNTER — Encounter (HOSPITAL_COMMUNITY)
Admission: EM | Disposition: A | Discharge status: Home / Self Care | Payer: Self-pay | Source: Home / Self Care | Attending: Internal Medicine

## 2019-12-07 ENCOUNTER — Encounter (HOSPITAL_COMMUNITY): Payer: Self-pay | Admitting: Internal Medicine

## 2019-12-07 ENCOUNTER — Other Ambulatory Visit: Payer: Self-pay

## 2019-12-07 DIAGNOSIS — K269 Duodenal ulcer, unspecified as acute or chronic, without hemorrhage or perforation: Secondary | ICD-10-CM

## 2019-12-07 DIAGNOSIS — K922 Gastrointestinal hemorrhage, unspecified: Secondary | ICD-10-CM

## 2019-12-07 DIAGNOSIS — R1310 Dysphagia, unspecified: Secondary | ICD-10-CM

## 2019-12-07 HISTORY — PX: ESOPHAGOGASTRODUODENOSCOPY: SHX5428

## 2019-12-07 HISTORY — PX: ESOPHAGEAL DILATION: SHX303

## 2019-12-07 LAB — GLUCOSE, CAPILLARY
Glucose-Capillary: 226 mg/dL — ABNORMAL HIGH (ref 70–99)
Glucose-Capillary: 207 mg/dL — ABNORMAL HIGH (ref 70–99)
Glucose-Capillary: 112 mg/dL — ABNORMAL HIGH (ref 70–99)
Glucose-Capillary: 224 mg/dL — ABNORMAL HIGH (ref 70–99)
Glucose-Capillary: 210 mg/dL — ABNORMAL HIGH (ref 70–99)
Glucose-Capillary: 174 mg/dL — ABNORMAL HIGH (ref 70–99)

## 2019-12-07 SURGERY — EGD (ESOPHAGOGASTRODUODENOSCOPY)
Anesthesia: Moderate Sedation

## 2019-12-07 MED ORDER — LIDOCAINE VISCOUS HCL 2 % MT SOLN
OROMUCOSAL | Status: AC
  Filled 2019-12-07: qty 15

## 2019-12-07 MED ORDER — PANTOPRAZOLE SODIUM 40 MG PO TBEC
40.0000 mg | DELAYED_RELEASE_TABLET | Freq: Two times a day (BID) | ORAL | Status: DC
  Administered 2019-12-07 – 2019-12-08 (×2): 40 mg via ORAL
  Filled 2019-12-07 (×2): qty 1

## 2019-12-07 MED ORDER — PROMETHAZINE HCL 25 MG/ML IJ SOLN
12.5000 mg | Freq: Once | INTRAMUSCULAR | Status: AC
  Administered 2019-12-07: 12.5 mg via INTRAVENOUS

## 2019-12-07 MED ORDER — LIDOCAINE VISCOUS HCL 2 % MT SOLN
OROMUCOSAL | Status: DC | PRN
  Administered 2019-12-07: 1 via OROMUCOSAL

## 2019-12-07 MED ORDER — PROMETHAZINE HCL 25 MG/ML IJ SOLN
INTRAMUSCULAR | Status: AC
  Filled 2019-12-07: qty 1

## 2019-12-07 MED ORDER — MIDAZOLAM HCL 5 MG/5ML IJ SOLN
INTRAMUSCULAR | Status: DC | PRN
  Administered 2019-12-07 (×2): 2 mg via INTRAVENOUS

## 2019-12-07 MED ORDER — SODIUM CHLORIDE FLUSH 0.9 % IV SOLN
INTRAVENOUS | Status: AC
  Filled 2019-12-07: qty 10

## 2019-12-07 MED ORDER — STERILE WATER FOR IRRIGATION IR SOLN
Status: DC | PRN
  Administered 2019-12-07: 1.5 mL

## 2019-12-07 MED ORDER — MINERAL OIL PO OIL
TOPICAL_OIL | ORAL | Status: AC
  Filled 2019-12-07: qty 30

## 2019-12-07 MED ORDER — ENOXAPARIN SODIUM 40 MG/0.4ML ~~LOC~~ SOLN
40.0000 mg | SUBCUTANEOUS | Status: DC
  Filled 2019-12-07: qty 0.4

## 2019-12-07 MED ORDER — INSULIN NPH (HUMAN) (ISOPHANE) 100 UNIT/ML ~~LOC~~ SUSP
7.0000 [IU] | Freq: Two times a day (BID) | SUBCUTANEOUS | Status: DC
  Administered 2019-12-07 – 2019-12-08 (×2): 7 [IU] via SUBCUTANEOUS
  Filled 2019-12-07: qty 10

## 2019-12-07 MED ORDER — SODIUM CHLORIDE 0.9 % IV SOLN: INTRAVENOUS | Status: DC

## 2019-12-07 MED ORDER — MIDAZOLAM HCL 5 MG/5ML IJ SOLN
INTRAMUSCULAR | Status: AC
  Filled 2019-12-07: qty 10

## 2019-12-07 MED ORDER — MEPERIDINE HCL 100 MG/ML IJ SOLN
INTRAMUSCULAR | Status: AC
  Filled 2019-12-07: qty 2

## 2019-12-07 MED ORDER — SODIUM CHLORIDE 0.9 % IV SOLN: INTRAVENOUS | Status: AC

## 2019-12-07 MED ORDER — MEPERIDINE HCL 100 MG/ML IJ SOLN
INTRAMUSCULAR | Status: DC | PRN
  Administered 2019-12-07 (×2): 25 mg via INTRAVENOUS

## 2019-12-07 NOTE — Plan of Care (Signed)
Discussed results with wife: Chad Kirk. SHE VOICED HER UNDERSTANDING.

## 2019-12-07 NOTE — Progress Notes (Signed)
PROGRESS NOTE    Chad Kirk  PXT:[id number] DOB: [birth year] DOA: 12/04/2019 PCP: Judy Pimple, FNP   Assessment & Plan:   Active Problems:   Non-intractable vomiting   BPH with obstruction/lower urinary tract symptoms   Type 1 diabetes mellitus with other specified complication (HCC)   Dehydration   Diabetic retinopathy (HCC)   Intractable vomiting   Essential hypertension   AKI (acute kidney injury) ([place])   Brief Narrative:  Pleasant 59 year old male with past medical history relevant for DM2 and HTN admitted with intractable nausea and vomiting, right-sided abdominal pain,   CT scan and abdominal ultrasound were unrevealing.    Repeat CT abdomen and pelvis on 12/01/2019 still unrevealing he was noted to be dehydrated.  He was started on IV fluids.  He was seen by GI and had EGD on 12/07/19. ----s/p EGD 12/07/19 with empiric dilation due to possible occult esophageal web, erosive gastritis, non-bleeding cratered and superficial duodenal ulcers without bleeding stigmata in duodenal bulb, D2 normal. Biopsy pending. Abdominal US without gallstones.   Assessment & Plan:   Active Problems:   Non-intractable vomiting   BPH with obstruction/lower urinary tract symptoms   Type 1 diabetes mellitus with other specified complication (HCC)   Dehydration   Diabetic retinopathy (HCC)   Intractable vomiting   Essential hypertension   AKI (acute kidney injury) ([place])   1. Intractable nausea and vomiting. Etiology is not entirely clear. Recent CT scan imaging on 4/13 was unrevealing.   Right upper quadrant ultrasound also unrevealing.   Gastroenterology consulted.   -CT scan and abdominal ultrasound were unrevealing.    Repeat CT abdomen and pelvis on 12/01/2019 still unrevealing he was noted to be dehydrated.  He was started on IV fluids.  He was seen by GI and had EGD on 12/07/19. ----s/p EGD 12/07/19 with empiric dilation due to possible occult esophageal web, erosive gastritis,  non-bleeding cratered and superficial duodenal ulcers without bleeding stigmata in duodenal bulb, D2 normal. Biopsy pending. Abdominal US without gallstones.  -Per GI service challenge patient with liquid diet and advance to soft diet, if patient is able to tolerate diet without further emesis possible discharge on 12/08/2019  2. Insulin-dependent diabetes, uncontrolled with hyperglycemia. --Insulin adjusted   3. acute kidney injury.  Secondary to dehydration.  Improved with fluids.   4. BPH with obstruction. Continue Foley catheter for bladder decompression. He will follow-up with urology. Continue on Flomax and finasteride.  5. Diabetic retinopathy. Follow-up scheduled with ophthalmology.  6)Hypertension.  -Metoprolol added  DVT prophylaxis: lovenox Code Status: full code Family Communication: discussed with wife at the bedside  Dispo: The patient is from: Home -Discharge home in a.m. if tolerating oral intake  Dispo: The patient is from: Home              Anticipated d/c is to: Home              Anticipated d/c date is: 1 day              Patient currently is not medically stable to d/c.-- -Per GI service challenge patient with liquid diet and advance to soft diet, if patient is able to tolerate diet without further emesis possible discharge on 12/08/2019  Consultants:   Gastroenterology  Procedures:  EGD 12/07/2019  Antimicrobials:       Subjective: -Some right-sided abdominal discomfort persist nausea persist no emesis -Willing to try liquid diet  Objective: Vitals:   12/07/19 1245 12/07/19 1250 12/07/19 1255 12/07/19 1503  BP: 113/75 (!) 143/91 94/69 97/78   Pulse:  84 79 76  Resp: (!) 29 20 17 16   Temp:      TempSrc:      SpO2:  100% 100% 100%  Weight:      Height:        Intake/Output Summary (Last 24 hours) at 12/07/2019 2046 Last data filed at 12/07/2019 1804 Gross per 24 hour  Intake 540 ml  Output 5000 ml  Net -4460 ml   Filed Weights     12/04/19 2050  Weight: 78.5 kg    Examination:  General exam: Appears calm and comfortable  Respiratory system: Clear to auscultation. Respiratory effort normal. Cardiovascular system: S1 & S2 heard, RRR. No JVD, murmurs, rubs, gallops or clicks. No pedal edema. Gastrointestinal system: Mild epigastric discomfort, nondistended, bowel sounds present  Central nervous system: Alert and oriented. No focal neurological deficits. Extremities: Symmetric 5 x 5 power. Skin: No rashes, lesions or ulcers Psychiatry: Judgement and insight appear normal. Mood & affect appropriate.     Data Reviewed: I have personally reviewed following labs and imaging studies  CBC: Recent Labs  Lab 12/04/19 2142 12/06/19 0556  WBC 9.5 6.0  HGB 14.5 13.1  HCT 46.6 43.3  MCV 84.1 86.3  PLT 260 225   Basic Metabolic Panel: Recent Labs  Lab 12/04/19 2142 12/05/19 0320 12/06/19 0556  NA 130* 134* 135  K 4.5 4.2 4.0  CL 99 107 103  CO2 20* 20* 23  GLUCOSE 396* 230* 234*  BUN 28* 23* 16  CREATININE 1.50* 1.37* 1.18  CALCIUM 8.8* 8.2* 8.5*   GFR: Estimated Creatinine Clearance: 69.6 mL/min (by C-G formula based on SCr of 1.18 mg/dL). Liver Function Tests: Recent Labs  Lab 12/04/19 2142 12/06/19 0556  AST 17 14*  ALT 18 14  ALKPHOS 60 53  BILITOT 1.3* 1.2  PROT 7.3 6.5  ALBUMIN 3.5 3.0*   Recent Labs  Lab 12/04/19 2142  LIPASE 33   No results for input(s): AMMONIA in the last 168 hours. Coagulation Profile: No results for input(s): INR, PROTIME in the last 168 hours. Cardiac Enzymes: No results for input(s): CKTOTAL, CKMB, CKMBINDEX, TROPONINI in the last 168 hours. BNP (last 3 results) No results for input(s): PROBNP in the last 8760 hours. HbA1C: Recent Labs    12/05/19 0320  HGBA1C 10.0*   CBG: Recent Labs  Lab 12/06/19 2141 12/07/19 0746 12/07/19 1117 12/07/19 1145 12/07/19 1641  GLUCAP 134* 207* 112* 224* 210*   Lipid Profile: No results for input(s): CHOL,  HDL, LDLCALC, TRIG, CHOLHDL, LDLDIRECT in the last 72 hours. Thyroid Function Tests: No results for input(s): TSH, T4TOTAL, FREET4, T3FREE, THYROIDAB in the last 72 hours. Anemia Panel: No results for input(s): VITAMINB12, FOLATE, FERRITIN, TIBC, IRON, RETICCTPCT in the last 72 hours. Sepsis Labs: Recent Labs  Lab 12/05/19 0320  LATICACIDVEN 0.7    Recent Results (from the past 240 hour(s))  SARS CORONAVIRUS 2 (TAT 6-24 HRS) Nasopharyngeal Nasopharyngeal Swab     Status: None   Collection Time: 12/05/19  7:44 AM   Specimen: Nasopharyngeal Swab  Result Value Ref Range Status   SARS Coronavirus 2 NEGATIVE NEGATIVE Final    Comment: (NOTE) SARS-CoV-2 target nucleic acids are NOT DETECTED. The SARS-CoV-2 RNA is generally detectable in upper and lower respiratory specimens during the acute phase of infection. Negative results do not preclude SARS-CoV-2 infection, do not rule out co-infections with other pathogens, and should not be used as the sole basis for  treatment or other patient management decisions. Negative results must be combined with clinical observations, patient history, and epidemiological information. The expected result is Negative. Fact Sheet for Patients: HairSlick.no Fact Sheet for Healthcare Providers: quierodirigir.com This test is not yet approved or cleared by the [place] FDA and  has been authorized for detection and/or diagnosis of SARS-CoV-2 by FDA under an Emergency Use Authorization (EUA). This EUA will remain  in effect (meaning this test can be used) for the duration of the COVID-19 declaration under Section 56 4(b)(1) of the Act, 21 U.S.C. section 360bbb-3(b)(1), unless the authorization is terminated or revoked sooner. Performed at [facility], [address], [place], [place] [address]          Radiology Studies: No results found.   Scheduled Meds: . [START ON 12/08/2019]  enoxaparin (LOVENOX) injection  40 mg Subcutaneous Q24H  . finasteride  5 mg Oral Daily  . insulin aspart  0-15 Units Subcutaneous TID WC  . insulin aspart  0-5 Units Subcutaneous QHS  . insulin NPH Human  7 Units Subcutaneous BID AC & HS  . lidocaine      . meperidine      . metoprolol tartrate  25 mg Oral BID  . midazolam      . mineral oil      . ondansetron (ZOFRAN) IV  4 mg Intravenous TID WC & HS  . pantoprazole  40 mg Oral BID AC  . promethazine      . sodium chloride flush      . tamsulosin  0.4 mg Oral Daily   Continuous Infusions: . sodium chloride 50 mL/hr at 12/07/19 1408     LOS: 2 days    Shon Hale, MD [facility]   If 7PM-7AM, please contact night-coverage [email or web address]  12/07/2019, 8:46 PM

## 2019-12-07 NOTE — Progress Notes (Signed)
Inpatient Diabetes Program Recommendations  AACE/ADA: New Consensus Statement on Inpatient Glycemic Control (2015)  Target Ranges:  Prepandial:   less than 140 mg/dL      Peak postprandial:   less than 180 mg/dL (1-2 hours)      Critically ill patients:  140 - 180 mg/dL   Lab Results  Component Value Date   GLUCAP 207 (H) 12/07/2019   HGBA1C 10.0 (H) 12/05/2019    Review of Glycemic Control Results for ELMUS, MATHES (MRN [id number]) as of 12/07/2019 10:41  Ref. Range 12/05/2019 20:56 12/06/2019 10:55 12/06/2019 16:36 12/06/2019 21:41 12/07/2019 07:46  Glucose-Capillary Latest Ref Range: 70 - 99 mg/dL 990 (H) 689 (H) 340 (H) 134 (H) 207 (H)   Diabetes history: DM1 Outpatient Diabetes medications: Humulin N 15 units am + 12 units pm + Novolin R 4-12 units tid meal coverage ac meals Current orders for Inpatient glycemic control: Novolog moderate correction tid + hs 0-5 units  Inpatient Diabetes Program Recommendations:   -Levemir 10 bid (80% of home basal =22 units daily)  Thank you, Darel Hong E. Drevon Plog, RN, MSN, CDE  Diabetes Coordinator Inpatient Glycemic Control Team Team Pager [phone] (8am-5pm) 12/07/2019 10:48 AM

## 2019-12-07 NOTE — Op Note (Signed)
[facility] Patient Name: Chad Kirk Procedure Date: 12/07/2019 12:17 PM MRN: [id number] Date of Birth: 1961-05-31 Attending MD: Jonette Eva MD, MD CSN: [id number] Age: 59 Admit Type: Inpatient Procedure:                Upper GI endoscopy WITH COLD FORCEPS BIOPSY Indications:              Nausea with vomiting, Persistent vomiting of                            unknown cause. ON ASA AND NO PPI. HARDLY USES                            NAPROXEN. Providers:                Jonette Eva MD, MD, Buel Ream. Thomasena Edis RN, RN,                            Edythe Clarity, Technician Referring MD:             Cletus Gash, FNP Medicines:                Promethazine 12.5 mg IV, Meperidine 50 mg IV,                            Midazolam 4 mg IV Complications:            No immediate complications. Estimated Blood Loss:     Estimated blood loss was minimal. Procedure:                Pre-Anesthesia Assessment:                           - Prior to the procedure, a History and Physical                            was performed, and patient medications and                            allergies were reviewed. The patient's tolerance of                            previous anesthesia was also reviewed. The risks                            and benefits of the procedure and the sedation                            options and risks were discussed with the patient.                            All questions were answered, and informed consent                            was obtained. Prior Anticoagulants: The patient has  taken Lovenox (enoxaparin), last dose was 1 day                            prior to procedure. ASA Grade Assessment: II - A                            patient with mild systemic disease. After reviewing                            the risks and benefits, the patient was deemed in                            satisfactory condition to undergo the procedure.                             After obtaining informed consent, the endoscope was                            passed under direct vision. Throughout the                            procedure, the patient's blood pressure, pulse, and                            oxygen saturations were monitored continuously. The                            [id number]-[id number] ([id number] scope was introduced through the                            mouth, and advanced to the second part of duodenum.                            The upper GI endoscopy was somewhat difficult due                            to the patient's agitation. The patient tolerated                            the procedure fairly well. Scope In: 12:39:48 PM Scope Out: 12:49:16 PM Total Procedure Duration: 0 hours 9 minutes 28 seconds  Findings:      No endoscopic abnormality was evident in the esophagus to explain the       patient's complaint of dysphagia. It was decided, however, to proceed       with dilation DUE TO POSSIBLE OCCULT ESOPHAGEAL WEB. A guidewire was       placed and the scope was withdrawn. Dilation was performed with a Savary       dilator with mild resistance at 16 mm and 17 mm.      Multiple localized small erosions with stigmata of recent bleeding were       found in the gastric fundus, in the gastric body and at the incisura.       Biopsies(2:BODY,1:INCISURA,2:ANTRUM) were taken with a cold  forceps for       Helicobacter pylori testing. NO PYLORIC STENOSIS.      Many non-bleeding cratered and superficial duodenal ulcers with no       stigmata of bleeding were found in the duodenal bulb.      The second portion of the duodenum was normal. Impression:               - NAUSEA/VOMITING MOST LIKELY DUE TO EROSIVE                            GASTRITIS,DUODENAL ULCERS, AND URINARY RETENTION                           - Erosive gastropathy DUE TO ASA. Biopsied.                           - Non-bleeding duodenal ulcers DUE TO ASA with no                            stigmata  of bleeding. Moderate Sedation:      Moderate (conscious) sedation was administered by the endoscopy nurse       and supervised by the endoscopist. The following parameters were       monitored: oxygen saturation, heart rate, blood pressure, and response       to care. Total physician intraservice time was 21 minutes. Recommendation:           - Patient has a contact number available for                            emergencies. The signs and symptoms of potential                            delayed complications were discussed with the                            patient. Return to normal activities tomorrow.                            Written discharge instructions were provided to the                            patient.                           - Full liquid diet. ADVANCE AS TOLERATED.                           - Continue present medications. PROTONIX BID FOR 3                            MOS THEN ONCE DAILY FORVEVER IF ASA IS MEDICALLY                            NECESSARY.                           -  Await pathology results.                           - Return to GI clinic in 3 months. Procedure Code(s):        --- Professional ---                           [phone], Esophagogastroduodenoscopy, flexible,                            transoral; with insertion of guide wire followed by                            passage of dilator(s) through esophagus over guide                            wire                           43239, 59, Esophagogastroduodenoscopy, flexible,                            transoral; with biopsy, single or multiple                           G0500, Moderate sedation services provided by the                            same physician or other qualified health care                            professional performing a gastrointestinal                            endoscopic service that sedation supports,                            requiring the presence of an independent trained                             observer to assist in the monitoring of the                            patient's level of consciousness and physiological                            status; initial 15 minutes of intra-service time;                            patient age 78 years or older (additional time may                            be reported with 09628, as appropriate) Diagnosis Code(s):        --- Professional ---  R13.10, Dysphagia, unspecified                           K92.2, Gastrointestinal hemorrhage, unspecified                           K26.9, Duodenal ulcer, unspecified as acute or                            chronic, without hemorrhage or perforation                           R11.2, Nausea with vomiting, unspecified                           A83.41, Cyclical vomiting syndrome unrelated to                            migraine CPT copyright 2019 American Medical Association. All rights reserved. The codes documented in this report are preliminary and upon coder review may  be revised to meet current compliance requirements. Barney Drain, MD Barney Drain MD, MD 12/07/2019 1:27:46 PM This report has been signed electronically. Number of Addenda: 0

## 2019-12-08 ENCOUNTER — Telehealth: Payer: Self-pay | Admitting: Gastroenterology

## 2019-12-08 ENCOUNTER — Other Ambulatory Visit: Payer: Self-pay

## 2019-12-08 LAB — GLUCOSE, CAPILLARY
Glucose-Capillary: 159 mg/dL — ABNORMAL HIGH (ref 70–99)
Glucose-Capillary: 228 mg/dL — ABNORMAL HIGH (ref 70–99)
Glucose-Capillary: 62 mg/dL — ABNORMAL LOW (ref 70–99)
Glucose-Capillary: 78 mg/dL (ref 70–99)

## 2019-12-08 LAB — CBC
HCT: 41.3 % (ref 39.0–52.0)
Hemoglobin: 13 g/dL (ref 13.0–17.0)
MCH: 27.1 pg (ref 26.0–34.0)
MCHC: 31.5 g/dL (ref 30.0–36.0)
MCV: 86.2 fL (ref 80.0–100.0)
Platelets: 193 10*3/uL (ref 150–400)
RBC: 4.79 MIL/uL (ref 4.22–5.81)
RDW: 12.6 % (ref 11.5–15.5)
WBC: 5.2 10*3/uL (ref 4.0–10.5)
nRBC: 0 % (ref 0.0–0.2)

## 2019-12-08 LAB — BASIC METABOLIC PANEL
Anion gap: 8 (ref 5–15)
BUN: 8 mg/dL (ref 6–20)
CO2: 25 mmol/L (ref 22–32)
Calcium: 8.4 mg/dL — ABNORMAL LOW (ref 8.9–10.3)
Chloride: 100 mmol/L (ref 98–111)
Creatinine, Ser: 1.06 mg/dL (ref 0.61–1.24)
GFR calc Af Amer: >60 mL/min (ref >60)
GFR calc non Af Amer: >60 mL/min (ref >60)
Glucose, Bld: 267 mg/dL — ABNORMAL HIGH (ref 70–99)
Potassium: 3.9 mmol/L (ref 3.5–5.1)
Sodium: 133 mmol/L — ABNORMAL LOW (ref 135–145)

## 2019-12-08 MED ORDER — AMLODIPINE BESYLATE 10 MG PO TABS: 10.0000 mg | ORAL_TABLET | Freq: Every day | ORAL | 11 refills | Status: DC

## 2019-12-08 MED ORDER — ACETAMINOPHEN 325 MG PO TABS: 650.0000 mg | ORAL_TABLET | Freq: Four times a day (QID) | ORAL | 0 refills | Status: DC | PRN

## 2019-12-08 MED ORDER — BLOOD GLUCOSE METER KIT: PACK | 3 refills | Status: DC

## 2019-12-08 MED ORDER — INSULIN REGULAR HUMAN 100 UNIT/ML IJ SOLN: 8.0000 [IU] | INTRAMUSCULAR | 11 refills | Status: DC

## 2019-12-08 MED ORDER — INSULIN NPH (HUMAN) (ISOPHANE) 100 UNIT/ML ~~LOC~~ SUSP: 10.0000 [IU] | Freq: Two times a day (BID) | SUBCUTANEOUS | 11 refills | Status: DC

## 2019-12-08 MED ORDER — OMEPRAZOLE 20 MG PO CPDR: 20.0000 mg | DELAYED_RELEASE_CAPSULE | Freq: Every day | ORAL | 1 refills | Status: DC

## 2019-12-08 MED ORDER — AMLODIPINE BESYLATE 5 MG PO TABS
2.5000 mg | ORAL_TABLET | Freq: Every day | ORAL | Status: DC
  Administered 2019-12-08: 2.5 mg via ORAL
  Filled 2019-12-08 (×2): qty 1

## 2019-12-08 MED ORDER — ONDANSETRON HCL 4 MG PO TABS: 4.0000 mg | ORAL_TABLET | Freq: Four times a day (QID) | ORAL | 0 refills | Status: DC | PRN

## 2019-12-08 MED ORDER — TAMSULOSIN HCL 0.4 MG PO CAPS: 0.4000 mg | ORAL_CAPSULE | Freq: Every day | ORAL | 3 refills | Status: DC

## 2019-12-08 MED ORDER — FINASTERIDE 5 MG PO TABS: 5.0000 mg | ORAL_TABLET | Freq: Every day | ORAL | 2 refills | Status: DC

## 2019-12-08 NOTE — Discharge Instructions (Signed)
1)Keep your sugars controlled.  Take the nausea medication as as needed  2)Per your request: Local Endocrinologist that will help you manage diabetes is  Dr. [name] located at [address] [place], [place] [address]. Phone number for Dr. [name] is [phone].  3)Take 10 units of NPH (N insulin) twice a day with meals  4)Take Insulin Regular injection 0-12 Units as per the scale below (R insulin) 0-12 Units Subcutaneous, 3 times daily with meals  CBG < 70: drink juice/milk or eat snack CBG 70 - 120: 0 unit CBG 121 - 150: 0 unit  CBG 151 - 200: 2 unit  CBG 201 - 250:  4 units  CBG 251 - 300: 6 units  CBG 301 - 350: 8 units   CBG 351 - 400: 10 units  CBG > 400:     12 units  5)Avoid ibuprofen/Advil/Aleve/Motrin/Goody Powders/Naproxen/BC powders/Meloxicam/Diclofenac/Indomethacin and other Nonsteroidal anti-inflammatory medications as these will make you more likely to bleed and can cause stomach ulcers, can also cause Kidney problems.   6)Keep your appointment with your urologist as previously scheduled

## 2019-12-08 NOTE — Progress Notes (Addendum)
REVIEWED. D/c home today with PRN ZOFRAN IF NAUSEA AND VOMITING RETURN, WILL NEED GES.   Subjective: Walked around today in the halls without pain. Feels pain overall improved but still present. No N/V. Tolerating diet. Wants to go home.   Objective: Vital signs in last 24 hours: Temp:  [97.4 F (36.3 C)-98.7 F (37.1 C)] 98.7 F (37.1 C) (04/20 0503) Pulse Rate:  [73-88] 85 (04/20 0958) Resp:  [2-29] 20 (04/20 0549) BP: (94-183)/(69-116) 166/103 (04/20 0958) SpO2:  [96 %-100 %] 100 % (04/20 0549) Last BM Date: 12/06/19 General:   Alert and oriented, pleasant Head:  Normocephalic and atraumatic. Abdomen:  Bowel sounds present, soft, non-tender, non-distended. No HSM or hernias noted. No rebound or guarding. No masses appreciated  Extremities:  Without edema. Neurologic:  Alert and  oriented x4 Psych:  Alert and cooperative. Normal mood and affect.  Intake/Output from previous day: 04/19 0701 - 04/20 0700 In: 660 [P.O.:360; I.V.:300] Out: 4150 [Urine:4150] Intake/Output this shift: No intake/output data recorded.  Lab Results: Recent Labs    12/06/19 0556 12/08/19 0915  WBC 6.0 5.2  HGB 13.1 13.0  HCT 43.3 41.3  PLT 225 193   BMET Recent Labs    12/06/19 0556 12/08/19 0915  NA 135 133*  K 4.0 3.9  CL 103 100  CO2 23 25  GLUCOSE 234* 267*  BUN 16 8  CREATININE 1.18 1.06  CALCIUM 8.5* 8.4*   LFT Recent Labs    12/06/19 0556  PROT 6.5  ALBUMIN 3.0*  AST 14*  ALT 14  ALKPHOS 53  BILITOT 1.2    Assessment:  Pleasant 59 year old male admitted with intractable nausea and vomiting, right-sided abdominal pain, s/p EGD yesterday with empiric dilation due to possible occult esophageal web, erosive gastritis, non-bleeding cratered and superficial duodenal ulcers without bleeding stigmata in duodenal bulb, D2 normal. Biopsy pending. [place] without gallstones. Clinically improved from admission, tolerating full liquids, with resolution of N/V. He desires to go home.  Soft diet has been ordered for lunch. If tolerates, appropriate for discharge home and will arrange outpatient follow-up in the office.   Plan: PPI BID for 3 months then daily indefinitely if aspirin medically necessary  Appropriate for discharge if tolerates soft diet at lunch  3 month follow-up with GI. Will arrange appointment.    Gelene Mink, PhD, ANP-BC [facility]      LOS: 3 days    12/08/2019, 10:02 AM

## 2019-12-08 NOTE — Progress Notes (Signed)
Discharge instructions reviewed with patient. Given AVS and prescription for blood sugar meter and supplies. Patient aware to pick up his other prescriptions from [facility] pharmacy. Verbalized understanding of instructions and follow-up appointments. Foley catheter removed by nursing staff prior to discharge as ordered by MD. Tolerated well, no complaints. Patient in stable condition, states awaiting wife's arrival for transport home for discharge.

## 2019-12-08 NOTE — TOC Initial Note (Addendum)
Transition of Care [facility]) - Initial/Assessment Note    Patient Details  Name: Chad Kirk MRN: [id number] Date of Birth: 07-May-1961  [facility]) CM/SW Contact:    Erin Sons, LCSW Phone Number: 12/08/2019, 1:42 PM  Clinical Narrative:                  CSW responded to [facility] consult to establish primarily care. PT is living in [place] with daughter. 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 He pays out of pocket for insulin at [facility]. Pt has not seen his PCP in 1  Year due to not having insurance. Pt. Wants to establish PCP in [place] because his wife lives here and will be his transport. Pt explains that travel time from wife to [place] is 1 hour but that his wife is willing to transport pt to PCP appointments.   CSW followed up by phone with wife. Wife states she is willing to transport pt. To PCP appointments and explains she hopes that pt. Will move in with her in [place] so she can assist with care giving.   CSW called [facility] and left voicemail requesting PCP appointment.   230p: Spoke with [facility] and set up PCP appointment for Friday 12/11/19 at 2pm. [facility] will call pt to screen for services. CSW gave [facility] pamphlet to pt and appointment time. CSW also provided referral info for [facility] [place] in case [facility] cannot take him.      Patient Goals and CMS Choice        Expected Discharge Plan and Services           Expected Discharge Date: 12/08/19                                    Prior Living Arrangements/Services                       Activities of Daily Living Home Assistive Devices/Equipment: None ADL Screening (condition at time of admission) Patient's cognitive ability adequate to safely complete daily activities?: Yes Is the patient deaf or have difficulty hearing?: No Does the patient  have difficulty seeing, even when wearing glasses/contacts?: No Does the patient have difficulty concentrating, remembering, or making decisions?: No Patient able to express need for assistance with ADLs?: Yes Does the patient have difficulty dressing or bathing?: No Independently performs ADLs?: Yes (appropriate for developmental age) Does the patient have difficulty walking or climbing stairs?: No Weakness of Legs: None Weakness of Arms/Hands: None  Permission Sought/Granted                  Emotional Assessment              Admission diagnosis:  Vomiting [R11.10] Non-intractable vomiting with nausea, unspecified vomiting type [R11.2] Intractable vomiting [R11.10] Patient Active Problem List   Diagnosis Date Noted  . Essential hypertension 12/06/2019  . AKI (acute kidney injury) (HCC) 12/06/2019  . Non-intractable vomiting 12/05/2019  . BPH with obstruction/lower urinary tract symptoms 12/05/2019  . Type 1 diabetes mellitus with other specified complication (HCC) 12/05/2019  . Dehydration 12/05/2019  . Diabetic retinopathy (HCC) 12/05/2019  . Intractable vomiting 12/05/2019   PCP:  Alpha Gula, FNP Pharmacy:   [facility] [phone] -  [place], [place] [id number] [place] [address] Phone: [phone] Fax: [phone]     Social Determinants of Health (SDOH) Interventions    Readmission Risk Interventions No flowsheet data found.

## 2019-12-08 NOTE — Telephone Encounter (Signed)
Chad Kirk, please arrange hospital outpatient follow-up in 3 months.

## 2019-12-08 NOTE — Discharge Summary (Signed)
Chad Kirk, is a 59 y.o. male  DOB 05-20-1961  MRN [id number].  Admission date:  12/04/2019  Admitting Physician  Kathie Dike, MD  Discharge Date:  12/08/2019   Primary MD  Judy Pimple, FNP  Recommendations for primary care physician for things to follow:    1)Keep your sugars controlled.  Take the nausea medication as as needed  2)Per your request: Local Endocrinologist that will help you manage diabetes is  Dr. Dorris Fetch located at [address] [place], [place] [address]. Phone number for Dr. Dorris Fetch is [phone].  3)Take 10 units of NPH (N insulin) twice a day with meals  4)Take Insulin Regular injection 0-12 Units as per the scale below (R insulin) 0-12 Units Subcutaneous, 3 times daily with meals  CBG < 70: drink juice/milk or eat snack CBG 70 - 120: 0 unit CBG 121 - 150: 0 unit  CBG 151 - 200: 2 unit  CBG 201 - 250:  4 units  CBG 251 - 300: 6 units  CBG 301 - 350: 8 units   CBG 351 - 400: 10 units  CBG > 400:     12 units  5)Avoid ibuprofen/Advil/Aleve/Motrin/Goody Powders/Naproxen/BC powders/Meloxicam/Diclofenac/Indomethacin and other Nonsteroidal anti-inflammatory medications as these will make you more likely to bleed and can cause stomach ulcers, can also cause Kidney problems.   6)Keep your appointment with your urologist as previously scheduled  Admission Diagnosis  Vomiting [R11.10] Non-intractable vomiting with nausea, unspecified vomiting type [R11.2] Intractable vomiting [R11.10]   Discharge Diagnosis  Vomiting [R11.10] Non-intractable vomiting with nausea, unspecified vomiting type [R11.2] Intractable vomiting [R11.10]    Active Problems:   Non-intractable vomiting   BPH with obstruction/lower urinary tract symptoms   Type 1 diabetes mellitus with other specified complication (HCC)   Dehydration   Diabetic retinopathy ([place])   Intractable vomiting   Essential  hypertension   AKI (acute kidney injury) ([place])      Past Medical History:  Diagnosis Date  . Diabetes mellitus without complication ([place])    DIAGNOSED AGE 34    Past Surgical History:  Procedure Laterality Date  . ESOPHAGEAL DILATION N/A 12/07/2019   Procedure: ESOPHAGEAL OR PYLORIC DILATION;  Surgeon: Danie Binder, MD;  [facility];  Service: Endoscopy;  Laterality: N/A;  . ESOPHAGOGASTRODUODENOSCOPY N/A 12/07/2019   Procedure: ESOPHAGOGASTRODUODENOSCOPY (EGD);  Surgeon: Danie Binder, MD;  Location: [facility];  Service: Endoscopy;  Laterality: N/A;  . HERNIA REPAIR Left        HPI  from the history and physical done on the day of admission:   I have personally briefly reviewed patient's old medical records in [place]  Chief Complaint: Persistent nausea and vomiting.  HPI: Chad Kirk is a 59 y.o. male with medical history significant of insulin-dependent diabetes, BPH with bladder outlet obstruction, presents to the hospital with complaints of abdominal pain with nausea and vomiting.  Reports that for the past 2 weeks, he has had right-sided abdominal pain which radiates to his left.  He initially felt that this discomfort was related to possibly an over distended bladder and he began to self catheterize at home.  Initially, abdominal pain did improve, but then persisted.  Over the past week, he has had persistent nausea and vomiting.  This is worse with any p.o. intake.  He reports swallowing approximately 20 times a day.  His bowel movements have otherwise been normal.  He is not had a fever.  No sick contacts.  He denies any dysuria.  He was recently evaluated at [facility] on 4/13 for similar symptoms.  He had a CT scan done at that time that did not show any acute findings other than a distended bladder.  Foley catheter was placed in the emergency room and arrangements were made for outpatient urology follow-up.  It was also felt that he had a urinary  tract infection placement was discharged on Bactrim.  Per patient, it was felt that his UTI was causing his nausea and vomiting.  Patient was unable to keep his antibiotics down at home.  Since his symptoms have persisted, he is presented to the emergency room today for evaluation.  ED Course: Vitals were noted to be stable.  Since he had a CT scan done within the past 3 days, this was not repeated.  He did have an acute abdominal series x-ray films that were unrevealing.  Lipase and liver enzymes were also unremarkable.  Per glycemia on arrival with a serum glucose of 396, but anion gap was 11 and serum bicarb of 20.  Creatinine mildly elevated at 1.5.  This improved overnight with IV fluids down to baseline of 1.3.  Lactic acid was noted to be normal.  Urinalysis did not show any evidence of pyuria.  Patient received several rounds of antiemetics, but symptoms persist.  He is not felt stable to discharge home at this time.  Review of Systems: As per HPI otherwise 10 point review of systems negative.    Hospital Course:       Brief Narrative:  Pleasant 59 year old male with past medical history relevant for DM2 and HTN admitted with intractable nausea and vomiting, right-sided abdominal pain,   CT scan and abdominal ultrasound were unrevealing.    Repeat CT abdomen and pelvis on 12/01/2019 still unrevealing he was noted to be dehydrated.  He was started on IV fluids.  He was seen by GI and had EGD on 12/07/19. ----s/p EGD 12/07/19 with empiric dilation due to possible occult esophageal web, erosive gastritis, non-bleeding cratered and superficial duodenal ulcers without bleeding stigmata in duodenal bulb, D2 normal. Biopsy pending. Abdominal US without gallstones. Clinically improved from admission, tolerating soft diet well with resolution of N/V. He desires to go home.-Ambulating in hallways -Per GI service okay to discharge home  Assessment & Plan:   Active Problems:   Non-intractable  vomiting   BPH with obstruction/lower urinary tract symptoms   Type 1 diabetes mellitus with other specified complication (HCC)   Dehydration   Diabetic retinopathy (HCC)   Intractable vomiting   Essential hypertension   AKI (acute kidney injury) ([place])   1. Intractable nausea and vomiting. Etiology is not entirely clear. Recent CT scan imaging on 4/13 was unrevealing.   Right upper quadrant ultrasound also unrevealing.   Gastroenterology consulted.   -CT scan and abdominal ultrasound were unrevealing.    Repeat CT abdomen and pelvis on 12/01/2019 still unrevealing he was noted to be dehydrated.  He was started on IV fluids.  He was seen  by GI and had EGD on 12/07/19. ----s/p EGD 12/07/19 with empiric dilation due to possible occult esophageal web, erosive gastritis, non-bleeding cratered and superficial duodenal ulcers without bleeding stigmata in duodenal bulb, D2 normal. Biopsy pending. Abdominal US without gallstones. Clinically improved from admission, tolerating soft diet well with resolution of N/V. He desires to go home.-Ambulating in hallways -Per GI service okay to discharge home -Outpatient GI follow-up arranged   2. Insulin-dependent diabetes, uncontrolled with hyperglycemia. --Insulin adjusted   3. acute kidney injury.  Secondary to dehydration.  Improved with fluids.   4. BPH with obstruction. Continue Foley catheter for bladder decompression. He will follow-up with urology. Continue on Flomax and finasteride.  5. Diabetic retinopathy. Follow-up scheduled with ophthalmology.  6)Hypertension.  -Discharge on amlodipine  DVT prophylaxis: lovenox Code Status: full code Family Communication: discussed with wife at the bedside  Dispo: The patient is from: Home -Discharge home   Discharge Condition: stable  Follow UP  Follow-up Information    Judy Pimple, FNP. Schedule an appointment as soon as possible for a visit in 2 days.   Specialty: Nurse  Practitioner Contact information: [place] [address] [phone]        Danie Binder, MD. Schedule an appointment as soon as possible for a visit in 2 day(s).   Specialty: Gastroenterology Contact information: [address] [place] [address] [phone]        Judy Pimple, FNP .   Specialty: Nurse Practitioner Contact information: [address] [place] [address] [place]. Go on 12/11/2019.   Why: Primary Care Appointment is set for Friday 12/11/19 at 2pm. They will call you to see if you are eligible.  Contact information: [address]  [place], [place] [address] [phone]           Consults obtained - gi  Diet and Activity recommendation:  As advised  Discharge Instructions    Discharge Instructions    Call MD for:  difficulty breathing, headache or visual disturbances   Complete by: As directed    Call MD for:  persistant dizziness or light-headedness   Complete by: As directed    Call MD for:  persistant nausea and vomiting   Complete by: As directed    Call MD for:  severe uncontrolled pain   Complete by: As directed    Call MD for:  temperature >100.4   Complete by: As directed    Diet - low sodium heart healthy   Complete by: As directed    Diet Carb Modified   Complete by: As directed    Discharge instructions   Complete by: As directed    1)Take 10 units of NPH (N insulin twice a day with meals)  2)Take Insulin Regular injection 0-12 Units as per the scale below (R insulin) 0-12 Units Subcutaneous, 3 times daily with meals  CBG < 70: drink juice/milk or eat snack CBG 70 - 120: 0 unit CBG 121 - 150: 0 unit  CBG 151 - 200: 2 unit  CBG 201 - 250:  4 units  CBG 251 - 300: 6 units  CBG 301 - 350: 8 units   CBG 351 - 400: 10 units  CBG > 400:     12 units  3)Avoid ibuprofen/Advil/Aleve/Motrin/Goody Powders/Naproxen/BC  powders/Meloxicam/Diclofenac/Indomethacin and other Nonsteroidal anti-inflammatory medications as these will make you more likely to bleed and can cause stomach ulcers, can  also cause Kidney problems.   4) keep your appointment with your urologist as previously scheduled   Increase activity slowly   Complete by: As directed         Discharge Medications     Allergies as of 12/08/2019   No Known Allergies     Medication List    STOP taking these medications   aspirin EC 81 MG tablet   HYDROcodone-acetaminophen 5-325 MG tablet Commonly known as: NORCO/VICODIN   naproxen sodium 220 MG tablet Commonly known as: ALEVE     TAKE these medications   acetaminophen 325 MG tablet Commonly known as: TYLENOL Take 2 tablets (650 mg total) by mouth every 6 (six) hours as needed for mild pain (or Fever >/= 101).   amLODipine 10 MG tablet Commonly known as: NORVASC Take 1 tablet (10 mg total) by mouth daily.   blood glucose meter kit and supplies Relion Prime or Dispense other brand based on patient and insurance preference. Use up to four times daily as directed. (FOR ICD-9 250.00, 250.01).   finasteride 5 MG tablet Commonly known as: PROSCAR Take 1 tablet (5 mg total) by mouth daily.   insulin NPH Human 100 UNIT/ML injection Commonly known as: NOVOLIN N Inject 0.1 mLs (10 Units total) into the skin 2 (two) times daily before a meal. What changed:   how much to take  when to take this  additional instructions   insulin regular 100 units/mL injection Commonly known as: NOVOLIN R Inject 0.08-0.1 mLs (8-10 Units total) into the skin See admin instructions. Insulin Regular njection 0-12 Units  0-12 Units Subcutaneous, 3 times daily with meals  CBG < 70: drink juice/milk or eat snack CBG 70 - 120: 0 unit CBG 121 - 150: 0 unit  CBG 151 - 200: 2 unit  CBG 201 - 250:  4 units  CBG 251 - 300: 6 units  CBG 301 - 350: 8 units   CBG 351 - 400: 10 units  CBG > 400:     12  units What changed: additional instructions   omeprazole 20 MG capsule Commonly known as: PriLOSEC Take 1 capsule (20 mg total) by mouth daily.   ondansetron 4 MG tablet Commonly known as: ZOFRAN Take 1 tablet (4 mg total) by mouth every 6 (six) hours as needed for nausea or vomiting.   tamsulosin 0.4 MG Caps capsule Commonly known as: FLOMAX Take 1 capsule (0.4 mg total) by mouth daily.       Major procedures and Radiology Reports - PLEASE review detailed and final reports for all details, in brief -   DG Abd Acute W/Chest  Result Date: 12/04/2019 CLINICAL DATA:  Nausea vomiting for several days EXAM: DG ABDOMEN ACUTE W/ 1V CHEST COMPARISON:  None. FINDINGS: Cardiac shadows within normal limits. The lungs are well aerated bilaterally. Bilateral nipple shadows are noted. No bony abnormality is seen. Scattered large and small bowel gas is noted. No free air is seen. No abnormal mass or abnormal calcifications are noted. No bony abnormality is seen. IMPRESSION: No acute abnormality is noted in the chest and abdomen. Electronically Signed   By: Inez Catalina M.D.   On: 12/04/2019 22:16   US Abdomen Limited RUQ  Result Date: 12/05/2019 CLINICAL DATA:  Right upper quadrant abdominal pain for 3 weeks with vomiting EXAM: ULTRASOUND ABDOMEN LIMITED RIGHT UPPER QUADRANT COMPARISON:  CT abdomen 12/02/2019 FINDINGS: Gallbladder: No gallstones or wall thickening visualized. No sonographic Murphy sign noted by sonographer. Common bile duct:  Diameter: 0.5 cm Liver: No focal lesion identified. Within normal limits in parenchymal echogenicity. Portal vein is patent on color Doppler imaging with normal direction of blood flow towards the liver. Other: None. IMPRESSION: No significant abnormality identified. Electronically Signed   By: Van Clines M.D.   On: 12/05/2019 12:54    Micro Results    Recent Results (from the past 240 hour(s))  SARS CORONAVIRUS 2 (TAT 6-24 HRS) Nasopharyngeal  Nasopharyngeal Swab     Status: None   Collection Time: 12/05/19  7:44 AM   Specimen: Nasopharyngeal Swab  Result Value Ref Range Status   SARS Coronavirus 2 NEGATIVE NEGATIVE Final    Comment: (NOTE) SARS-CoV-2 target nucleic acids are NOT DETECTED. The SARS-CoV-2 RNA is generally detectable in upper and lower respiratory specimens during the acute phase of infection. Negative results do not preclude SARS-CoV-2 infection, do not rule out co-infections with other pathogens, and should not be used as the sole basis for treatment or other patient management decisions. Negative results must be combined with clinical observations, patient history, and epidemiological information. The expected result is Negative. Fact Sheet for Patients: SugarRoll.be Fact Sheet for Healthcare Providers: https://www.woods-mathews.com/ This test is not yet approved or cleared by the [place] FDA and  has been authorized for detection and/or diagnosis of SARS-CoV-2 by FDA under an Emergency Use Authorization (EUA). This EUA will remain  in effect (meaning this test can be used) for the duration of the COVID-19 declaration under Section 56 4(b)(1) of the Act, 21 U.S.C. section 360bbb-3(b)(1), unless the authorization is terminated or revoked sooner. Performed at [facility], [place] [address] [place], [place] [address]        Today   Subjective    Chad Kirk today has no new complaints  No Nausea, Vomiting or Diarrhea  No fever  Or chills          Patient has been seen and examined prior to discharge   Objective   Blood pressure (!) 166/103, pulse 85, temperature 98.7 F (37.1 C), temperature source Oral, resp. rate 20, height '5\' 10"'  (1.778 m), weight 78.5 kg, SpO2 98 %.   Intake/Output Summary (Last 24 hours) at 12/08/2019 1740 Last data filed at 12/08/2019 1500 Gross per 24 hour  Intake 600 ml  Output 2850 ml  Net -2250 ml     Exam Gen:- Awake Alert, no acute distress  HEENT:- [place].AT, No sclera icterus Neck-Supple Neck,No JVD,.  Lungs-  CTAB , good air movement bilaterally  CV- S1, S2 normal, regular Abd-  +ve B.Sounds, Abd Soft, No tenderness,    Extremity/Skin:- No  edema,   good pulses Psych-affect is appropriate, oriented x3 Neuro-no new focal deficits, no tremors    Data Review   CBC w Diff:  Lab Results  Component Value Date   WBC 5.2 12/08/2019   HGB 13.0 12/08/2019   HCT 41.3 12/08/2019   PLT 193 12/08/2019   LYMPHOPCT 26 10/11/2018   MONOPCT 10 10/11/2018   EOSPCT 1 10/11/2018   BASOPCT 1 10/11/2018    CMP:  Lab Results  Component Value Date   NA 133 (L) 12/08/2019   K 3.9 12/08/2019   CL 100 12/08/2019   CO2 25 12/08/2019   BUN 8 12/08/2019   CREATININE 1.06 12/08/2019   PROT 6.5 12/06/2019   ALBUMIN 3.0 (L) 12/06/2019   BILITOT 1.2 12/06/2019   ALKPHOS 53 12/06/2019   AST 14 (L) 12/06/2019   ALT 14 12/06/2019  .  Total Discharge time is about 33 minutes  Roxan Hockey M.D on 12/08/2019 at 5:40 PM  Go to [email or web address] -  for contact info  [facility] - Office  [phone]

## 2019-12-08 NOTE — Progress Notes (Signed)
TR[facility] night shift.  The patient continues to be hypertensive with SBP in the 170s and 180s.  DBP is in the 100s and 110s.  This has not responded to oral metoprolol twice daily and as needed IV metoprolol.  I will add amlodipine 2.5 mg p.o. daily.  Sanda Klein, MD

## 2019-12-08 NOTE — Progress Notes (Signed)
1640: NT reports pt's blood sugar is 62mg /dl. Pt alert and oriented, skin warm and dry, pt asking about his pending discharge. Advised pt that MD was contacted again regarding pt's questions about foley cath and meds.  Pt given of juice and peanut butter crackers to eat now for low blood sugar.  1707: Blood sugar rechecked with reading of 76mg /dl. Dr. in room and aware of current blood sugar. No new orders. Pt denies c/o, remains alert and oriented, warm and dry.

## 2019-12-08 NOTE — Progress Notes (Signed)
Inpatient Diabetes Program Recommendations  AACE/ADA: New Consensus Statement on Inpatient Glycemic Control   Target Ranges:  Prepandial:   less than 140 mg/dL      Peak postprandial:   less than 180 mg/dL (1-2 hours)      Critically ill patients:  140 - 180 mg/dL   Results for Chad Kirk, Chad Kirk (MRN [id number]) as of 12/08/2019 07:48  Ref. Range 12/06/2019 07:33 12/07/2019 07:46 12/07/2019 11:17 12/07/2019 11:45 12/07/2019 16:41 12/07/2019 21:11  Glucose-Capillary Latest Ref Range: 70 - 99 mg/dL 226 (H) 207 (H) 112 (H) 224 (H) 210 (H) 174 (H)  Results for Chad Kirk, Chad Kirk (MRN [id number]) as of 12/08/2019 07:48  Ref. Range 12/05/2019 03:20  Hemoglobin A1C Latest Ref Range: 4.8 - 5.6 % 10.0 (H)   Review of Glycemic Control  Diabetes history: DM2 Outpatient Diabetes medications: NPH 12 units BID, Regular 9 units TID with meals Current orders for Inpatient glycemic control: NPH 7 units BID, Novolog 0-15 units TID with meals, Novolog 0-5 units QHS  Inpatient Diabetes Program Recommendations:    HbgA1C:  A1C 10% on 12/05/19 indicating an average glucose of 240 mg/dl over the past 2-3 months. Patient notes 3-4 episodes of hypoglycemia per week and likely over treating lows. Anticipate patient may need adjustments with outpatient insulin regimen and encouraged to follow up with a provider to get DM under better control.  NOTE: In reviewing chart, noted patient does not have any insurance. Last office visit with PCP was 11/05/18 and last office visit with Endocrinology was 11/12/18. Spoke with patient over the phone about diabetes and home regimen for diabetes control. Patient reports being followed by PCP for diabetes management but admits that he has not seen PCP since start of COVID pandemic (last office note by PCP was on 11/05/2018).  Patient states that he is taking NPH 12 units BID and Regular 9 units TID with meals.  Patient is getting insulin from [facility] over the counter. Patient notes that glucose is usually  in the 120-180 mg/dl range in the mornings and notes that on days he is more active, his glucose usually goes low around 3pm. Patient reports hypoglycemia occurs 3-4 times a week. Patient reports that when he experiences hypoglycemia he drinks 1/2 cup of orange juice and if he does not feel better in about 5 minutes he drinks another 1/2 cup of orange juice. Discussed hypoglycemia and encouraged patient to treat with 15 grams of carbohydrates (example 4 oz of juice) and wait 15 minutes and recheck; then if still low, treat with another 15 grams of carbohydrates. Patient notes that he is most likely over treating hypoglycemia because it makes him feel poorly (sweating, dizzy).  Explained that if he is experiencing hypoglycemia that often, he likely needs insulin adjusted to keep glucose controlled but also to avoid frequent hypoglycemia. Discussed current A1C 10% on 12/05/19 and explained that current A1C indicates an average glucose of 240 mg/dl over the past 2-3 months. Explained that if he is having frequently hypoglycemia and then over treating it could be contributing to elevated A1C. Discussed glucose and A1C goals. Discussed importance of checking CBGs and maintaining good CBG control to prevent long-term and short-term complications. Explained how hyperglycemia leads to damage within blood vessels which lead to the common complications seen with uncontrolled diabetes. Stressed to the patient the importance of improving glycemic control to prevent further complications from uncontrolled diabetes. Patient reports that he is blind in his left eye and he would like to see a different eye  doctor to see what their opinion is about improving his vision in that eye. Patient notes that he would have to pay for any procedures that he needed. Patient notes that due to having to pay out of pocket to see a provider, he has not been as often and sometimes has to set up payment plan to pay for charges. Patient states that he  would like to get a new PCP and is interested in local clinics that he may be able to go to for primary care especially since he is uninsured. Patient also inquired about local Endocrinologist. Informed patient that Dr. [name] was local endocrinologist and that his contact information would be added to discharge paperwork in case he wants to call and establish care. Encouraged patient to keep a log of glucose readings and exact dose of insulin taken which patient will need to take to doctor appointments. Explained how the doctor can use the log book to continue to make adjustments with DM medications if needed. Patient reports that he is able to afford his insulin from [facility] and also gets testing supplies from [place]. Informed patient that [facility] consult would be ordered to see is he can get established with one of the local clinics.  Encouraged patient to get an appointment to see a provider as soon as possible regarding assistance with improving DM control. Patient verbalized understanding of information discussed and reports no further questions at this time related to diabetes.   Thanks, Orlando Penner, RN, MSN, CDE Diabetes Coordinator Inpatient Diabetes Program [phone] (Team Pager from 8am to 5pm)

## 2019-12-09 ENCOUNTER — Encounter: Payer: Self-pay | Admitting: Gastroenterology

## 2019-12-09 LAB — SURGICAL PATHOLOGY

## 2019-12-09 NOTE — Telephone Encounter (Signed)
Patient scheduled.

## 2019-12-14 ENCOUNTER — Telehealth: Payer: Self-pay | Admitting: Gastroenterology

## 2019-12-14 MED ORDER — OMEPRAZOLE 20 MG PO CPDR: DELAYED_RELEASE_CAPSULE | ORAL | 3 refills | Status: DC

## 2019-12-14 NOTE — Telephone Encounter (Signed)
  Please call pt. His stomach Bx shows EROSIVE gastritis DUE TO ASPIRIN USE. CONTINUE OMEPRAZOLE.  INCREASE TO TWICE DAILY FOR 3 MOS THE ONCE DAILY INDEFINITELY IF YOU TAKING ASPIRIN DAILY. FOLLOW UP IN 3 MOS.

## 2019-12-15 ENCOUNTER — Telehealth: Payer: Self-pay | Admitting: Gastroenterology

## 2019-12-15 ENCOUNTER — Telehealth: Payer: Self-pay | Admitting: Emergency Medicine

## 2019-12-15 NOTE — Telephone Encounter (Signed)
error 

## 2019-12-15 NOTE — Telephone Encounter (Signed)
Pt called and stated he receives assistance from [facility] and wants to know if we are able to hand write a hard rx for him to receive this medication. ? If so in the memo section if we could write "[facility]" .Marland KitchenMarland Kitchen

## 2019-12-15 NOTE — Telephone Encounter (Signed)
Pt said SF took a biopsy on him recently and was having pain on his side. He wondered if this was normal. Please call [phone]

## 2019-12-15 NOTE — Telephone Encounter (Signed)
Called verified pt name and dob Notified pt of results Rx sent into pharmacy yesterday to take bid for 3 months then once daily indefinitely  Appt 7/27 at 830 with AB PT STATED HE understood and thanked me for the call

## 2019-12-15 NOTE — Telephone Encounter (Signed)
Called pt see lab results

## 2019-12-16 ENCOUNTER — Telehealth: Payer: Self-pay | Admitting: Gastroenterology

## 2019-12-16 NOTE — Telephone Encounter (Signed)
PATIENT CALLED CHECKING ON THE STATUS OF HIS REFILL

## 2019-12-16 NOTE — Telephone Encounter (Signed)
Provider aware. Waiting on response. see previous note

## 2019-12-16 NOTE — Telephone Encounter (Signed)
Notified pt he can pick up hand written rx between now and 445 today or tomorrow morning after 8am. Left rx at front desk

## 2019-12-16 NOTE — Telephone Encounter (Signed)
error 

## 2019-12-21 NOTE — Telephone Encounter (Signed)
REVIEWED-NO ADDITIONAL RECOMMENDATIONS. 

## 2019-12-30 ENCOUNTER — Other Ambulatory Visit (HOSPITAL_COMMUNITY)
Admission: RE | Admit: 2019-12-30 | Discharge: 2019-12-30 | Disposition: A | Discharge status: Home / Self Care | Payer: Self-pay | Source: Ambulatory Visit | Attending: Physician Assistant | Admitting: Physician Assistant

## 2019-12-30 ENCOUNTER — Ambulatory Visit: Payer: Self-pay | Admitting: Physician Assistant

## 2019-12-30 ENCOUNTER — Other Ambulatory Visit: Payer: Self-pay

## 2019-12-30 ENCOUNTER — Encounter: Payer: Self-pay | Admitting: Physician Assistant

## 2019-12-30 VITALS — BP 146/90 | HR 71 | Temp 97.7°F | Ht 68.75 in | Wt 166.8 lb

## 2019-12-30 DIAGNOSIS — Z7689 Persons encountering health services in other specified circumstances: Secondary | ICD-10-CM

## 2019-12-30 DIAGNOSIS — E13319 Other specified diabetes mellitus with unspecified diabetic retinopathy without macular edema: Secondary | ICD-10-CM

## 2019-12-30 DIAGNOSIS — E1165 Type 2 diabetes mellitus with hyperglycemia: Secondary | ICD-10-CM | POA: Insufficient documentation

## 2019-12-30 DIAGNOSIS — I10 Essential (primary) hypertension: Secondary | ICD-10-CM

## 2019-12-30 DIAGNOSIS — F1011 Alcohol abuse, in remission: Secondary | ICD-10-CM

## 2019-12-30 DIAGNOSIS — H547 Unspecified visual loss: Secondary | ICD-10-CM

## 2019-12-30 LAB — LIPID PANEL
Cholesterol: 212 mg/dL — ABNORMAL HIGH (ref 0–200)
HDL: 48 mg/dL (ref >40)
LDL Cholesterol: 149 mg/dL — ABNORMAL HIGH (ref 0–99)
Total CHOL/HDL Ratio: 4.4 RATIO
Triglycerides: 74 mg/dL (ref <150)
VLDL: 15 mg/dL (ref 0–40)

## 2019-12-30 LAB — COMPREHENSIVE METABOLIC PANEL
ALT: 22 U/L (ref 0–44)
AST: 16 U/L (ref 15–41)
Albumin: 3.7 g/dL (ref 3.5–5.0)
Alkaline Phosphatase: 65 U/L (ref 38–126)
Anion gap: 8 (ref 5–15)
BUN: 21 mg/dL — ABNORMAL HIGH (ref 6–20)
CO2: 27 mmol/L (ref 22–32)
Calcium: 9.4 mg/dL (ref 8.9–10.3)
Chloride: 103 mmol/L (ref 98–111)
Creatinine, Ser: 1.11 mg/dL (ref 0.61–1.24)
GFR calc Af Amer: >60 mL/min (ref >60)
GFR calc non Af Amer: >60 mL/min (ref >60)
Glucose, Bld: 256 mg/dL — ABNORMAL HIGH (ref 70–99)
Potassium: 4.6 mmol/L (ref 3.5–5.1)
Sodium: 138 mmol/L (ref 135–145)
Total Bilirubin: 0.6 mg/dL (ref 0.3–1.2)
Total Protein: 7.8 g/dL (ref 6.5–8.1)

## 2019-12-30 NOTE — Patient Instructions (Signed)
For [facility] -spouse proof of income - one month of consecutive recent paystubs  -4506-T form signed by spouse

## 2019-12-30 NOTE — Progress Notes (Signed)
BP (!) 146/90   Pulse 71   Temp 97.7 F (36.5 C)   Ht 5' 8.75" (1.746 m)   Wt 166 lb 12 oz (75.6 kg)   SpO2 100%   BMI 24.80 kg/m    Subjective:    Patient ID: Chad Kirk, male    DOB: 08-May-1961, 59 y.o.   MRN: [id number]  HPI: Chad Kirk is a 59 y.o. male presenting on 12/30/2019 for New Patient (Initial Visit)   HPI    Pt had negative covid 19 screening questionnaire.    Pt is 59yoM who presents to establish care.    Pt previously went to PCP in [facility].    Pt is 59yoM who was in hospital in April 2021 with intractable vomiting.  During that admission,  Active Problems:   Non-intractable vomiting   BPH with obstruction/lower urinary tract symptoms   Type 1 diabetes mellitus with other specified complication    Dehydration   Diabetic retinopathy    Intractable vomiting   Essential hypertension   AKI (acute kidney injury)   Pt has follow up appt with GI in July.   Pt was Seen by endocrine 11/12/18-  It appears that he was only seen there one time.  They do mention plans to check c-peptide, but review of labs in care everywhere it appears to never have been performed.    Metformin- intolerant Glipizide - "didn't work"  Pt reluctant historian.  He agrees with statement that he feels good.  Pt seen by urology in [place].  He says he does not have a follow up there.  He says he was paying out of pocket to see them.  - dr Roby Lofts.    He thinks he is urinating okay now.    Pt has seen eye dr at [facility] for diabetic retinopathy but he doesn't have f/u scheduled.  He says the doctor wanted to do surgery but the pt didn't want to.   Pt says he applied for financial assistance at [facility] but that he wasn't approved.    Pt does not work.  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    He checks his bs at home.  bs running- mostly 200s-  Some around 171.  It was 52 the other night but he says he didn't eat much.     a1c on 12/05/19 was 10.0  Pt can't see what he writes.  He doesn't drive any more because he can't see well enough.    He says he applied for medicaid while he was in the hospital but he says he hasn't heard yet.   Pt says he was previously living with his daughter in [place] but recenly moved back to [place] with his wife.    Pt says he got both doses of covid vaccination   Relevant past medical, surgical, family and social history reviewed and updated as indicated. Interim medical history since our last visit reviewed. Allergies and medications reviewed and updated.   Current Outpatient Medications:  .  amLODipine (NORVASC) 10 MG tablet, Take 1 tablet (10 mg total) by mouth daily., Disp: 30 tablet, Rfl: 11 .  blood glucose meter kit and supplies, [employer] or Dispense other brand based on patient and insurance preference. Use up to four times daily as directed. (FOR ICD-9 250.00, 250.01)., Disp: 1 each, Rfl: 3 .  brimonidine (ALPHAGAN) 0.2 % ophthalmic solution, Place 1 drop into the left  eye 3 (three) times daily., Disp: , Rfl:  .  Carboxymethylcellulose Sodium (LUBRICANT EYE DROPS OP), Apply 1 Container to eye as needed (dry eye). Left eye, Disp: , Rfl:  .  dorzolamide (TRUSOPT) 2 % ophthalmic solution, Place 1 drop into the left eye 3 (three) times daily., Disp: , Rfl:  .  finasteride (PROSCAR) 5 MG tablet, Take 1 tablet (5 mg total) by mouth daily., Disp: 30 tablet, Rfl: 2 .  insulin NPH Human (NOVOLIN N) 100 UNIT/ML injection, Inject 0.1 mLs (10 Units total) into the skin 2 (two) times daily before a meal., Disp: 10 mL, Rfl: 11 .  insulin regular (NOVOLIN R) 100 units/mL injection, Inject 0.08-0.1 mLs (8-10 Units total) into the skin See admin instructions. Insulin Regular njection 0-12 Units  0-12 Units Subcutaneous, 3 times daily with meals  CBG < 70: drink juice/milk or eat snack CBG 70 - 120: 0 unit CBG 121 - 150: 0 unit  CBG 151 - 200: 2 unit  CBG 201 - 250:  4 units  CBG  251 - 300: 6 units  CBG 301 - 350: 8 units   CBG 351 - 400: 10 units  CBG > 400:     12 units, Disp: 10 mL, Rfl: 11 .  metoCLOPramide (REGLAN) 10 MG tablet, Take 10 mg by mouth as needed for nausea., Disp: , Rfl:  .  Multiple Vitamins-Minerals (QC DAILY MULTIVIT/MULTIMINERAL PO), Take 1 tablet by mouth daily., Disp: , Rfl:  .  omeprazole (PRILOSEC) 20 MG capsule, 1 PO BID FOR 3 MOS THEN 1 PO INDEFINITELY DUE TO ASPIRIN USE (Patient taking differently: Take 20 mg by mouth 2 (two) times daily before a meal. 1 PO BID FOR 3 MOS THEN 1 PO INDEFINITELY DUE TO ASPIRIN USE), Disp: 180 capsule, Rfl: 3 .  ondansetron (ZOFRAN) 4 MG tablet, Take 1 tablet (4 mg total) by mouth every 6 (six) hours as needed for nausea or vomiting., Disp: 10 tablet, Rfl: 0 .  Probiotic Product (PROBIOTIC PO), Take 1 tablet by mouth daily., Disp: , Rfl:  .  promethazine (PHENERGAN) 25 MG tablet, Take 25 mg by mouth every 6 (six) hours as needed for nausea or vomiting., Disp: , Rfl:  .  senna-docusate (SENOKOT-S) 8.6-50 MG tablet, Take 1 tablet by mouth daily., Disp: , Rfl:  .  tamsulosin (FLOMAX) 0.4 MG CAPS capsule, Take 1 capsule (0.4 mg total) by mouth daily., Disp: 30 capsule, Rfl: 3 .  Timolol Maleate 0.5 % (DAILY) SOLN, Apply 1 drop to eye in the morning, at noon, and at bedtime. In left eye, Disp: , Rfl:      Review of Systems  Per HPI unless specifically indicated above     Objective:    BP (!) 146/90   Pulse 71   Temp 97.7 F (36.5 C)   Ht 5' 8.75" (1.746 m)   Wt 166 lb 12 oz (75.6 kg)   SpO2 100%   BMI 24.80 kg/m   Wt Readings from Last 3 Encounters:  12/30/19 166 lb 12 oz (75.6 kg)  12/04/19 173 lb (78.5 kg)  10/11/18 170 lb (77.1 kg)    Physical Exam Vitals reviewed.  Constitutional:      General: He is not in acute distress.    Appearance: He is well-developed.     Comments: Pt appears older than states age and appears chronically ill.    HENT:     Head: Normocephalic and atraumatic.  Neck:      Thyroid:  No thyromegaly.  Cardiovascular:     Rate and Rhythm: Normal rate and regular rhythm.  Pulmonary:     Effort: Pulmonary effort is normal.     Breath sounds: Normal breath sounds. No wheezing or rales.  Abdominal:     General: Bowel sounds are normal.     Palpations: Abdomen is soft. There is no mass.     Tenderness: There is no abdominal tenderness.  Musculoskeletal:     Cervical back: Neck supple.     Right lower leg: No edema.     Left lower leg: No edema.  Lymphadenopathy:     Cervical: No cervical adenopathy.  Skin:    General: Skin is warm and dry.     Findings: No rash.  Neurological:     Mental Status: He is alert and oriented to person, place, and time.     Motor: No weakness or tremor.  Psychiatric:        Attention and Perception: Attention normal.        Behavior: Behavior normal. Behavior is cooperative.           Assessment & Plan:     Encounter Diagnoses  Name Primary?  . Encounter to establish care Yes  . Uncontrolled type 2 diabetes mellitus with hyperglycemia ([place])   . Alcohol abuse, in remission   . Vision impairment   . Diabetic retinopathy associated with diabetes mellitus of other type, macular edema presence unspecified, unspecified laterality, unspecified retinopathy severity ([place])   . Essential hypertension      -pt Needs to return to eye specialist at [facility].  Will re-refer and check on financial assistance  -get additional labs- c peptide, bmp, lipids  -pt is signed up for  [facility] medication assistance program  -No medication changes today  -pt has Already gotten covid vaccination  -provider has contacted [facility] to find out about pt's medicaid application/CAFA since he seems a bit confused about his application.    -pt encouraged to avoid etoh  -pt to follow up in  2-3 weeks.  He is to contact office sooner prn

## 2019-12-31 LAB — C-PEPTIDE: C-Peptide: 1.3 ng/mL (ref 1.1–4.4)

## 2020-01-01 ENCOUNTER — Encounter: Payer: Self-pay | Admitting: Physician Assistant

## 2020-01-20 ENCOUNTER — Encounter: Payer: Self-pay | Admitting: Physician Assistant

## 2020-01-20 ENCOUNTER — Other Ambulatory Visit: Payer: Self-pay

## 2020-01-20 ENCOUNTER — Telehealth: Payer: Self-pay | Admitting: Student

## 2020-01-20 ENCOUNTER — Ambulatory Visit: Payer: Self-pay | Admitting: Physician Assistant

## 2020-01-20 VITALS — BP 130/92 | HR 85 | Temp 98.1°F | Wt 174.1 lb

## 2020-01-20 DIAGNOSIS — E1165 Type 2 diabetes mellitus with hyperglycemia: Secondary | ICD-10-CM

## 2020-01-20 DIAGNOSIS — F1011 Alcohol abuse, in remission: Secondary | ICD-10-CM

## 2020-01-20 DIAGNOSIS — I10 Essential (primary) hypertension: Secondary | ICD-10-CM

## 2020-01-20 DIAGNOSIS — E13319 Other specified diabetes mellitus with unspecified diabetic retinopathy without macular edema: Secondary | ICD-10-CM

## 2020-01-20 DIAGNOSIS — E785 Hyperlipidemia, unspecified: Secondary | ICD-10-CM

## 2020-01-20 DIAGNOSIS — H547 Unspecified visual loss: Secondary | ICD-10-CM

## 2020-01-20 MED ORDER — OMEPRAZOLE 20 MG PO CPDR: 20.0000 mg | DELAYED_RELEASE_CAPSULE | Freq: Every day | ORAL | 0 refills | Status: DC

## 2020-01-20 MED ORDER — AMLODIPINE BESYLATE 10 MG PO TABS: 10.0000 mg | ORAL_TABLET | Freq: Every day | ORAL | 0 refills | Status: DC

## 2020-01-20 MED ORDER — ATORVASTATIN CALCIUM 20 MG PO TABS: 20.0000 mg | ORAL_TABLET | Freq: Every day | ORAL | 0 refills | Status: DC

## 2020-01-20 NOTE — Telephone Encounter (Signed)
-----   Message from Norval Gable, RN sent at 01/13/2020  8:55 AM EDT ----- Regarding: specialist appointment Chad Kirk,   I also wanted to ask about his eye dr appointment at [facility]. He is stating his eyesight is worse and he is having troubling administering his insulin and seeing that its correct. Carollee Herter recommended he continue with the same provider in [place] however that appointment is not until Dec. 25 which doesn't make much sense to me that Christmas day. He says he has called on his own to attempt to be seen sooner. His wife states they applied for their charity care but have not heard back.  Would that be something that as his primary care could call about to see if he could be seen sooner and if that appointment is even correct as it is Dec 25th? I'm not sure how many visits he has had there.  Thanks for your help.   Chad Kirk

## 2020-01-20 NOTE — Patient Instructions (Addendum)
[facility] Financial Counseling - [phone]  Eye Center - [facility] [place], [place] [address] Rich Reining, [place] [phone]

## 2020-01-20 NOTE — Progress Notes (Signed)
BP (!) 130/92   Pulse 85   Temp 98.1 F (36.7 C)   Wt 174 lb 1.6 oz (79 kg)   SpO2 97%   BMI 25.90 kg/m    Subjective:    Patient ID: Chad Kirk, male    DOB: 1960/11/16, 59 y.o.   MRN: [id number]  HPI: Chad Kirk is a 59 y.o. male presenting on 01/20/2020 for No chief complaint on file.   HPI    Pt had a negative covid 19 screening questionnaire.  Pt is 45yoM who presented to clinic in May to establish care.     He has uncontrolled DM which is complicated by he fact that he can't barely see.    Insulin- he is currently Taking 6units of N and 6units of R.   He is only taking 2 shots daily- 1 of each insulin.      He is not checking his bs at home.  He says he lost his meter.   He has difficulty checking his sugar himself because he cannot see.    He says he had a bs log but doesn't know what happened to it.   He says he is having No more vomiting- he says he is good  He says he is no longer drinking  Nurse is working on eye appt with [facility]  No word on medicaid/cafa.  Have been told that medicaid application is under review.       Relevant past medical, surgical, family and social history reviewed and updated as indicated. Interim medical history since our last visit reviewed. Allergies and medications reviewed and updated.   Current Outpatient Medications:  .  amLODipine (NORVASC) 10 MG tablet, Take 1 tablet (10 mg total) by mouth daily., Disp: 30 tablet, Rfl: 11 .  brimonidine (ALPHAGAN) 0.2 % ophthalmic solution, Place 1 drop into the left eye 3 (three) times daily., Disp: , Rfl:  .  Carboxymethylcellulose Sodium (LUBRICANT EYE DROPS OP), Apply 1 Container to eye as needed (dry eye). Left eye, Disp: , Rfl:  .  dorzolamide (TRUSOPT) 2 % ophthalmic solution, Place 1 drop into the left eye 3 (three) times daily., Disp: , Rfl:  .  finasteride (PROSCAR) 5 MG tablet, Take 1 tablet (5 mg total) by mouth daily., Disp: 30 tablet, Rfl: 2 .  insulin NPH Human (NOVOLIN N)  100 UNIT/ML injection, Inject 0.1 mLs (10 Units total) into the skin 2 (two) times daily before a meal. (Patient taking differently: Inject 6 Units into the skin 2 (two) times daily before a meal. ), Disp: 10 mL, Rfl: 11 .  insulin regular (NOVOLIN R) 100 units/mL injection, Inject 0.08-0.1 mLs (8-10 Units total) into the skin See admin instructions. Insulin Regular njection 0-12 Units  0-12 Units Subcutaneous, 3 times daily with meals  CBG < 70: drink juice/milk or eat snack CBG 70 - 120: 0 unit CBG 121 - 150: 0 unit  CBG 151 - 200: 2 unit  CBG 201 - 250:  4 units  CBG 251 - 300: 6 units  CBG 301 - 350: 8 units   CBG 351 - 400: 10 units  CBG > 400:     12 units, Disp: 10 mL, Rfl: 11 .  Multiple Vitamins-Minerals (QC DAILY MULTIVIT/MULTIMINERAL PO), Take 1 tablet by mouth daily., Disp: , Rfl:  .  omeprazole (PRILOSEC) 20 MG capsule, 1 PO BID FOR 3 MOS THEN 1 PO INDEFINITELY DUE TO ASPIRIN USE (Patient taking differently: Take 20 mg by mouth 2 (  two) times daily before a meal. 1 PO BID FOR 3 MOS THEN 1 PO INDEFINITELY DUE TO ASPIRIN USE), Disp: 180 capsule, Rfl: 3 .  Probiotic Product (PROBIOTIC PO), Take 1 tablet by mouth daily., Disp: , Rfl:  .  senna-docusate (SENOKOT-S) 8.6-50 MG tablet, Take 1 tablet by mouth daily., Disp: , Rfl:  .  tamsulosin (FLOMAX) 0.4 MG CAPS capsule, Take 1 capsule (0.4 mg total) by mouth daily., Disp: 30 capsule, Rfl: 3 .  Timolol Maleate 0.5 % (DAILY) SOLN, Apply 1 drop to eye in the morning, at noon, and at bedtime. In left eye, Disp: , Rfl:  .  metoCLOPramide (REGLAN) 10 MG tablet, Take 10 mg by mouth as needed for nausea., Disp: , Rfl:  .  ondansetron (ZOFRAN) 4 MG tablet, Take 1 tablet (4 mg total) by mouth every 6 (six) hours as needed for nausea or vomiting. (Patient not taking: Reported on 01/20/2020), Disp: 10 tablet, Rfl: 0 .  promethazine (PHENERGAN) 25 MG tablet, Take 25 mg by mouth every 6 (six) hours as needed for nausea or vomiting., Disp: , Rfl:    Review of  Systems  Per HPI unless specifically indicated above     Objective:    BP (!) 130/92   Pulse 85   Temp 98.1 F (36.7 C)   Wt 174 lb 1.6 oz (79 kg)   SpO2 97%   BMI 25.90 kg/m   Wt Readings from Last 3 Encounters:  01/20/20 174 lb 1.6 oz (79 kg)  12/30/19 166 lb 12 oz (75.6 kg)  12/04/19 173 lb (78.5 kg)    Physical Exam Vitals reviewed.  Constitutional:      General: He is not in acute distress.    Appearance: He is well-developed.     Comments: Appears older than stated age  HENT:     Head: Normocephalic and atraumatic.  Cardiovascular:     Rate and Rhythm: Normal rate and regular rhythm.  Pulmonary:     Effort: Pulmonary effort is normal.     Breath sounds: Normal breath sounds. No wheezing.  Abdominal:     General: Bowel sounds are normal.     Palpations: Abdomen is soft.     Tenderness: There is no abdominal tenderness.  Musculoskeletal:     Cervical back: Neck supple.  Lymphadenopathy:     Cervical: No cervical adenopathy.  Skin:    General: Skin is warm and dry.  Neurological:     Mental Status: He is alert and oriented to person, place, and time.  Psychiatric:        Behavior: Behavior normal.     Results for orders placed or performed during the hospital encounter of 12/30/19  Lipid panel  Result Value Ref Range   Cholesterol 212 (H) 0 - 200 mg/dL   Triglycerides 74 <973 mg/dL   HDL 48 >53 mg/dL   Total CHOL/HDL Ratio 4.4 RATIO   VLDL 15 0 - 40 mg/dL   LDL Cholesterol 299 (H) 0 - 99 mg/dL  C-peptide  Result Value Ref Range   C-Peptide 1.3 1.1 - 4.4 ng/mL  Comprehensive metabolic panel  Result Value Ref Range   Sodium 138 135 - 145 mmol/L   Potassium 4.6 3.5 - 5.1 mmol/L   Chloride 103 98 - 111 mmol/L   CO2 27 22 - 32 mmol/L   Glucose, Bld 256 (H) 70 - 99 mg/dL   BUN 21 (H) 6 - 20 mg/dL   Creatinine, Ser 2.42 0.61 - 1.24  mg/dL   Calcium 9.4 8.9 - 70.1 mg/dL   Total Protein 7.8 6.5 - 8.1 g/dL   Albumin 3.7 3.5 - 5.0 g/dL   AST 16 15 - 41  U/L   ALT 22 0 - 44 U/L   Alkaline Phosphatase 65 38 - 126 U/L   Total Bilirubin 0.6 0.3 - 1.2 mg/dL   GFR calc non Af Amer >60 >60 mL/min   GFR calc Af Amer >60 >60 mL/min   Anion gap 8 5 - 15    a1c- 10.0 (12/05/19)     Assessment & Plan:   Encounter Diagnoses  Name Primary?  Marland Kitchen Uncontrolled type 2 diabetes mellitus with hyperglycemia ([facility]) Yes  . Alcohol abuse, in remission   . Vision impairment   . Diabetic retinopathy associated with diabetes mellitus of other type, macular edema presence unspecified, unspecified laterality, unspecified retinopathy severity ([facility])   . Essential hypertension   . Hyperlipidemia, unspecified hyperlipidemia type       -reviewed labs with pt  -Add atorvastatin -Need to  order insulin- lantus and novolog- pt cannot see to self administer and just guesses at doses.  He lives with his daughter and He sees his daughter every day in the evening when she gets home from work.  She may be willing to draw up his insulin for him.  Will have daughter come to next appointment if she an. -Will need to order insulin first -pt to RTO in 1 month with his daughter.  He is to contact office sooner prn

## 2020-01-20 NOTE — Telephone Encounter (Signed)
LPN called Dr. Margaretmary Eddy office on 01-20-20 while pt was in the office for his f/u with PA. Dr. Margaretmary Eddy staff confirms that pt is NOT currently scheduled for an appointment on August 13, 2020, but appt seen on [facility] (Plan of Care) is a "dummy date" to get pt scheduled.  LPN advised staff that pt is in need of appt soon as his vision is worsening. Pt has hx of diabetic retinopathy in bilateral eyes and worsening vision since Nov. 2020 with left eye being worse than right eye.  Staff noted and states someone from their office will contact the patient to schedule an appt. Pt is also to get set up with financial counseling as he is uninsured.   Pt was informed of this and is to expect call from [facility] to have appt scheduled. Pt was also given Dr. Margaretmary Eddy office number to call if he does not receive call form them. Pt was given number to [facility] financial assistance for him to contact. All this information was given to patient on his AVS printed after his appt with PA. Pt verbalized understanding.

## 2020-02-25 ENCOUNTER — Ambulatory Visit: Payer: Self-pay | Admitting: Physician Assistant

## 2020-02-29 DIAGNOSIS — K219 Gastro-esophageal reflux disease without esophagitis: Secondary | ICD-10-CM | POA: Insufficient documentation

## 2020-03-02 HISTORY — PX: EYE SURGERY: SHX253

## 2020-03-03 ENCOUNTER — Ambulatory Visit: Payer: Self-pay | Admitting: Physician Assistant

## 2020-03-10 ENCOUNTER — Ambulatory Visit: Payer: Self-pay | Admitting: Physician Assistant

## 2020-03-15 ENCOUNTER — Encounter: Payer: Self-pay | Admitting: Gastroenterology

## 2020-03-15 ENCOUNTER — Other Ambulatory Visit: Payer: Self-pay

## 2020-03-15 ENCOUNTER — Other Ambulatory Visit: Payer: Self-pay | Admitting: *Deleted

## 2020-03-15 ENCOUNTER — Ambulatory Visit: Payer: 59 | Admitting: Gastroenterology

## 2020-03-15 DIAGNOSIS — K269 Duodenal ulcer, unspecified as acute or chronic, without hemorrhage or perforation: Secondary | ICD-10-CM | POA: Diagnosis not present

## 2020-03-15 DIAGNOSIS — E1069 Type 1 diabetes mellitus with other specified complication: Secondary | ICD-10-CM

## 2020-03-15 NOTE — Progress Notes (Signed)
Referring Provider: Alpha Gula, FNP Primary Care Physician:  Jacquelin Hawking, PA-C Primary GI: previously Dr. Darrick Penna, now Dr. Marletta Lor    Chief Complaint  Patient presents with  . Hospitalization Follow-up    doing ok    HPI:   Chad Kirk is a 59 y.o. male presenting today in follow-up after hospitalization April 2021 for abdominal pain, N/V. He underwent EGD during admission with  erosive gastritis, many non-bleeding cratered and superficial duodenal ulcers without stigmata of bleeding in duodenal bulb. Empiric dilation due to possible occult esophageal web. Negative H.pylori. Felt to be NSAID effect. Had been on Ibuprofen and aspirin. Recommending indefinite PPI while on aspirin.   Chad Kirk, wife is present. Colonoscopy at [facility] per patient and wife. Possibly more than 10 years ago. No personal history of polyps. No family history of colorectal cancer or polyps. He is wanting to hold off on this right now but considering in future.   Prilosec once daily. No aspirin or NSAIDs. Eye surgery recently, going back 2 weeks to doctor. No GERD exacerbations. Denies history of chronic GERD. No abdominal pain, N/V. Has good appetite. No dysphagia. No melena or hematochezia. No unexplained weight loss. No changes in bowel habits. Requesting PCP referral and endocrinology.     Past Medical History:  Diagnosis Date  . Diabetes mellitus without complication (HCC)    DIAGNOSED AGE 49  . Hypertension     Past Surgical History:  Procedure Laterality Date  . ESOPHAGEAL DILATION N/A 12/07/2019   Procedure: ESOPHAGEAL OR PYLORIC DILATION;  Surgeon: West Bali, MD;  Location: [facility];  Service: Endoscopy;  Laterality: N/A;  . ESOPHAGOGASTRODUODENOSCOPY N/A 12/07/2019   erosive gastritis, many non-bleeding cratered and superficial duodenal ulcers without stigmata of bleeding in duodenal bulb. Empiric dilation due to possible occult esophageal web. Negative H.pylori.   . EYE  SURGERY  03/02/2020   diabetic retinopathy, lens placement, cataract removal, right eye  . HERNIA REPAIR Left 1990    Current Outpatient Medications  Medication Sig Dispense Refill  . amLODipine (NORVASC) 10 MG tablet Take 1 tablet (10 mg total) by mouth daily. 90 tablet 0  . brimonidine (ALPHAGAN) 0.2 % ophthalmic solution Place 1 drop into the left eye 3 (three) times daily.    . Carboxymethylcellulose Sodium (LUBRICANT EYE DROPS OP) Apply 1 Container to eye as needed (dry eye). Left eye    . dorzolamide (TRUSOPT) 2 % ophthalmic solution Place 1 drop into the left eye 3 (three) times daily.    . finasteride (PROSCAR) 5 MG tablet Take 1 tablet (5 mg total) by mouth daily. 30 tablet 2  . insulin glargine (LANTUS) 100 UNIT/ML injection Inject 15-20 Units into the skin 2 (two) times daily.    . Multiple Vitamins-Minerals (QC DAILY MULTIVIT/MULTIMINERAL PO) Take 1 tablet by mouth daily.    Marland Kitchen omeprazole (PRILOSEC) 20 MG capsule Take 1 capsule (20 mg total) by mouth daily. 90 capsule 0  . Probiotic Product (PROBIOTIC PO) Take 1 tablet by mouth daily.    Marland Kitchen senna-docusate (SENOKOT-S) 8.6-50 MG tablet Take 1 tablet by mouth as needed.     . tamsulosin (FLOMAX) 0.4 MG CAPS capsule Take 1 capsule (0.4 mg total) by mouth daily. 30 capsule 3  . Timolol Maleate 0.5 % (DAILY) SOLN Apply 1 drop to eye in the morning, at noon, and at bedtime. In left eye     No current facility-administered medications for this visit.    Allergies  as of 03/15/2020  . (No Known Allergies)    Family History  Problem Relation Age of Onset  . Stroke Mother   . Hypertension Mother   . Diabetes Mother   . Hypertension Father   . Diabetes Father   . Colon cancer Neg Hx   . Colon polyps Neg Hx     Social History   Socioeconomic History  . Marital status: Married    Spouse name: Not on file  . Number of children: Not on file  . Years of education: Not on file  . Highest education level: Not on file  Occupational  History  . Not on file  Tobacco Use  . Smoking status: Former Smoker    Packs/day: 1.00    Years: 5.00    Pack years: 5.00    Quit date: 08/20/1988    Years since quitting: 31.5  . Smokeless tobacco: Never Used  Vaping Use  . Vaping Use: Never used  Substance and Sexual Activity  . Alcohol use: Not Currently    Comment: denied 03/15/20  . Drug use: No  . Sexual activity: Not on file  Other Topics Concern  . Not on file  Social History Narrative  . Not on file   Social Determinants of Health   Financial Resource Strain:   . Difficulty of Paying Living Expenses:   Food Insecurity:   . Worried About Programme researcher, broadcasting/film/video in the Last Year:   . Barista in the Last Year:   Transportation Needs:   . Freight forwarder (Medical):   Marland Kitchen Lack of Transportation (Non-Medical):   Physical Activity:   . Days of Exercise per Week:   . Minutes of Exercise per Session:   Stress:   . Feeling of Stress :   Social Connections:   . Frequency of Communication with Friends and Family:   . Frequency of Social Gatherings with Friends and Family:   . Attends Religious Services:   . Active Member of Clubs or Organizations:   . Attends Banker Meetings:   Marland Kitchen Marital Status:     Review of Systems: Gen: Denies fever, chills, anorexia. Denies fatigue, weakness, weight loss.  CV: Denies chest pain, palpitations, syncope, peripheral edema, and claudication. Resp: Denies dyspnea at rest, cough, wheezing, coughing up blood, and pleurisy. GI: see HPI Derm: Denies rash, itching, dry skin Psych: Denies depression, anxiety, memory loss, confusion. No homicidal or suicidal ideation.  Heme: Denies bruising, bleeding, and enlarged lymph nodes.  Physical Exam: BP (!) 171/100   Pulse 85   Temp (!) 96.8 F (36 C) (Oral)   Ht 5\' 9"  (1.753 m)   Wt 175 lb (79.4 kg)   BMI 25.84 kg/m  General:   Alert and oriented. No distress noted. Pleasant and cooperative.  Head:  Normocephalic and  atraumatic. Eyes:  Conjuctiva clear without scleral icterus. Mouth:  Mask in place Abdomen:  +BS, soft, non-tender and non-distended. No rebound or guarding. No HSM or masses noted. Msk:  Symmetrical without gross deformities. Normal posture. Extremities:  Without edema. Neurologic:  Alert and  oriented x4 Psych:  Alert and cooperative. Normal mood and affect.  ASSESSMENT: Finnley Larusso is a 59 y.o. male presenting today with history of duodenal ulcers due to aspirin/NSAIDs earlier this year, uncomplicated, without stigmata of bleeding. He has completed 3 month course of PPI BID and now taking once daily.  He no longer is on any aspirin products and completely avoiding NSAIDs as  well. No chronic GERD. Discussed with him current guidelines and indications for stopping vs continuing therapy. As he is no longer taking any aspirin/NSAIDs, ulcer felt to be related to NSAID process, no alarm features, we will have him wean off of this. If he has recurrence of symptoms, he is to resume PPI daily. I did discuss that if aspirin were to be prescribed again, he would need to be on a PPI indefinitely.  Last colonoscopy over 10 years ago at [facility]; no family history of colorectal cancer or polyps, and he denies any personal history of polyps. He is holding off on routine screening at this point but will consider in future.    PLAN:  Continue to avoid aspirin/NSAIDs  May wean off PPI but will need to take daily if aspirin medically necessary. He is to call with any recurrent symptoms  Colonoscopy when patient agreeable  Return in 6 months  Referral to endocrine per his request for diabetes management. We will also provide a list of PCPs accepting patients in the area.   Gelene Mink, PhD, ANP-BC [facility]

## 2020-03-15 NOTE — Patient Instructions (Signed)
You can finish out omeprazole (Prilosec) and then take as needed. If you start to have reflux symptoms or other concerning symptoms, please call us. IF at any chance you have to restart aspirin, you will need to take Prilosec daily.  We will see you in 6 months!  I am trying to get you plugged in with endocrinology. We have provided a list of primary care to call to see who is accepting new patients.    I enjoyed seeing you again today! As you know, I value our relationship and want to provide genuine, compassionate, and quality care. I welcome your feedback. If you receive a survey regarding your visit,  I greatly appreciate you taking time to fill this out. See you next time!  Gelene Mink, PhD, ANP-BC [facility]

## 2020-05-05 ENCOUNTER — Ambulatory Visit (INDEPENDENT_AMBULATORY_CARE_PROVIDER_SITE_OTHER): Payer: 59 | Admitting: "Endocrinology

## 2020-05-05 ENCOUNTER — Encounter: Payer: Self-pay | Admitting: "Endocrinology

## 2020-05-05 VITALS — BP 120/84 | HR 72 | Ht 69.0 in | Wt 174.2 lb

## 2020-05-05 DIAGNOSIS — IMO0002 Reserved for concepts with insufficient information to code with codable children: Secondary | ICD-10-CM

## 2020-05-05 DIAGNOSIS — E782 Mixed hyperlipidemia: Secondary | ICD-10-CM

## 2020-05-05 DIAGNOSIS — E11311 Type 2 diabetes mellitus with unspecified diabetic retinopathy with macular edema: Secondary | ICD-10-CM | POA: Diagnosis not present

## 2020-05-05 DIAGNOSIS — E1165 Type 2 diabetes mellitus with hyperglycemia: Secondary | ICD-10-CM | POA: Diagnosis not present

## 2020-05-05 DIAGNOSIS — I10 Essential (primary) hypertension: Secondary | ICD-10-CM

## 2020-05-05 LAB — POCT GLYCOSYLATED HEMOGLOBIN (HGB A1C): Hemoglobin A1C: 10 % — AB (ref 4.0–5.6)

## 2020-05-05 MED ORDER — GLIPIZIDE ER 2.5 MG PO TB24
2.5000 mg | ORAL_TABLET | Freq: Every day | ORAL | 1 refills | Status: DC
Start: 2020-05-05 — End: 2020-05-19

## 2020-05-05 MED ORDER — AMLODIPINE BESYLATE 10 MG PO TABS: 10.0000 mg | ORAL_TABLET | Freq: Every day | ORAL | 1 refills | Status: DC

## 2020-05-05 MED ORDER — ACCU-CHEK GUIDE VI STRP: ORAL_STRIP | 2 refills | Status: DC

## 2020-05-05 MED ORDER — ROSUVASTATIN CALCIUM 10 MG PO TABS
10.0000 mg | ORAL_TABLET | Freq: Every day | ORAL | 1 refills | Status: DC
Start: 2020-05-05 — End: 2020-11-21

## 2020-05-05 MED ORDER — ACCU-CHEK GUIDE ME W/DEVICE KIT
1.0000 | PACK | 0 refills | Status: AC
Start: 2020-05-05 — End: ?

## 2020-05-05 MED ORDER — BD PEN NEEDLE SHORT U/F 31G X 8 MM MISC: 1.0000 | 3 refills | Status: DC

## 2020-05-05 MED ORDER — INSULIN GLARGINE 100 UNIT/ML ~~LOC~~ SOLN: 20.0000 [IU] | Freq: Every day | SUBCUTANEOUS | 1 refills | Status: DC

## 2020-05-05 NOTE — Patient Instructions (Signed)

## 2020-05-05 NOTE — Progress Notes (Signed)
Endocrinology Consult Note       05/05/2020, 3:25 PM   Subjective:    Patient ID: Chad Kirk, male    DOB: [birth year].  Dallen Bunte is being seen in consultation for management of currently uncontrolled symptomatic diabetes requested by  Patient, No Pcp Per.   Past Medical History:  Diagnosis Date  . Diabetes mellitus without complication ([place])    DIAGNOSED AGE 59  . Hypertension     Past Surgical History:  Procedure Laterality Date  . ESOPHAGEAL DILATION N/A 12/07/2019   Procedure: ESOPHAGEAL OR PYLORIC DILATION;  Surgeon: Danie Binder, MD;  Location: [facility];  Service: Endoscopy;  Laterality: N/A;  . ESOPHAGOGASTRODUODENOSCOPY N/A 12/07/2019   erosive gastritis, many non-bleeding cratered and superficial duodenal ulcers without stigmata of bleeding in duodenal bulb. Empiric dilation due to possible occult esophageal web. Negative H.pylori.   . EYE SURGERY  03/02/2020   diabetic retinopathy, lens placement, cataract removal, right eye  . HERNIA REPAIR Left 1990    Social History   Socioeconomic History  . Marital status: Married    Spouse name: Not on file  . Number of children: Not on file  . Years of education: Not on file  . Highest education level: Not on file  Occupational History  . Not on file  Tobacco Use  . Smoking status: Former Smoker    Packs/day: 1.00    Years: 5.00    Pack years: 5.00    Quit date: 08/20/1988    Years since quitting: 31.7  . Smokeless tobacco: Never Used  Vaping Use  . Vaping Use: Never used  Substance and Sexual Activity  . Alcohol use: Not Currently    Comment: denied 03/15/20  . Drug use: No  . Sexual activity: Not on file  Other Topics Concern  . Not on file  Social History Narrative  . Not on file   Social Determinants of Health   Financial Resource Strain:   . Difficulty of Paying Living Expenses: Not on file  Food Insecurity:   .  Worried About Charity fundraiser in the Last Year: Not on file  . Ran Out of Food in the Last Year: Not on file  Transportation Needs:   . Lack of Transportation (Medical): Not on file  . Lack of Transportation (Non-Medical): Not on file  Physical Activity:   . Days of Exercise per Week: Not on file  . Minutes of Exercise per Session: Not on file  Stress:   . Feeling of Stress : Not on file  Social Connections:   . Frequency of Communication with Friends and Family: Not on file  . Frequency of Social Gatherings with Friends and Family: Not on file  . Attends Religious Services: Not on file  . Active Member of Clubs or Organizations: Not on file  . Attends Archivist Meetings: Not on file  . Marital Status: Not on file    Family History  Problem Relation Age of Onset  . Stroke Mother   . Hypertension Mother   . Diabetes Mother   . Hypertension Father   . Diabetes Father   .  Colon cancer Neg Hx   . Colon polyps Neg Hx     Outpatient Encounter Medications as of 05/05/2020  Medication Sig  . amLODipine (NORVASC) 10 MG tablet Take 1 tablet (10 mg total) by mouth daily.  . Blood Glucose Monitoring Suppl (ACCU-CHEK GUIDE ME) w/Device KIT 1 Piece by Does not apply route as directed.  . brimonidine (ALPHAGAN) 0.2 % ophthalmic solution Place 1 drop into the left eye 3 (three) times daily.  . Carboxymethylcellulose Sodium (LUBRICANT EYE DROPS OP) Apply 1 Container to eye as needed (dry eye). Left eye  . dorzolamide (TRUSOPT) 2 % ophthalmic solution Place 1 drop into the left eye 3 (three) times daily.  . finasteride (PROSCAR) 5 MG tablet Take 1 tablet (5 mg total) by mouth daily.  Marland Kitchen glipiZIDE (GLUCOTROL XL) 2.5 MG 24 hr tablet Take 1 tablet (2.5 mg total) by mouth daily with breakfast.  . glucose blood (ACCU-CHEK GUIDE) test strip Use as instructed  . insulin glargine (LANTUS) 100 UNIT/ML injection Inject 0.2 mLs (20 Units total) into the skin at bedtime.  . Insulin Pen Needle  (B-D ULTRAFINE III SHORT PEN) 31G X 8 MM MISC 1 each by Does not apply route as directed.  . Multiple Vitamins-Minerals (QC DAILY MULTIVIT/MULTIMINERAL PO) Take 1 tablet by mouth daily.  . Probiotic Product (PROBIOTIC PO) Take 1 tablet by mouth daily.  . rosuvastatin (CRESTOR) 10 MG tablet Take 1 tablet (10 mg total) by mouth daily.  Marland Kitchen senna-docusate (SENOKOT-S) 8.6-50 MG tablet Take 1 tablet by mouth as needed.   . tamsulosin (FLOMAX) 0.4 MG CAPS capsule Take 1 capsule (0.4 mg total) by mouth daily.  . Timolol Maleate 0.5 % (DAILY) SOLN Apply 1 drop to eye in the morning, at noon, and at bedtime. In left eye  . [DISCONTINUED] amLODipine (NORVASC) 10 MG tablet Take 1 tablet (10 mg total) by mouth daily.  . [DISCONTINUED] insulin glargine (LANTUS) 100 UNIT/ML injection Inject 20 Units into the skin at bedtime.  . [DISCONTINUED] omeprazole (PRILOSEC) 20 MG capsule Take 1 capsule (20 mg total) by mouth daily.   No facility-administered encounter medications on file as of 05/05/2020.    ALLERGIES: No Known Allergies  VACCINATION STATUS:  There is no immunization history on file for this patient.  Diabetes He presents for his initial diabetic visit. He has type 2 diabetes mellitus. Onset time: He was diagnosed at approximate age of 70 years. His disease course has been worsening. Hypoglycemia symptoms include nervousness/anxiousness, sweats and tremors. Pertinent negatives for hypoglycemia include no confusion, headaches, pallor or seizures. Associated symptoms include blurred vision, polydipsia and polyuria. Pertinent negatives for diabetes include no chest pain, no fatigue, no polyphagia and no weakness. There are no hypoglycemic complications. Symptoms are worsening. Diabetic complications include nephropathy, peripheral neuropathy, PVD and retinopathy. (He is reporting legal blindness from diabetic retinopathy on both eyes.) Risk factors for coronary artery disease include dyslipidemia, diabetes  mellitus, family history, male sex, hypertension and sedentary lifestyle. Current diabetic treatments: He is currently on Lantus 15-20 units 1-2 times a day. His weight is fluctuating minimally. He is following a generally unhealthy diet. When asked about meal planning, he reported none. (He did not bring any logs nor meter to review with him.  Is not sure when was the last time he monitors blood glucose.  His point-of-care A1c is 10% today on May 05, 2020.) He does not see a podiatrist.Eye exam is current.  Hyperlipidemia This is a chronic problem. The current episode started  more than 1 year ago. The problem is uncontrolled. Recent lipid tests were reviewed and are high. Pertinent negatives include no chest pain, myalgias or shortness of breath. Risk factors for coronary artery disease include diabetes mellitus, dyslipidemia, hypertension, male sex, a sedentary lifestyle and family history.  Hypertension Associated symptoms include blurred vision and sweats. Pertinent negatives include no chest pain, headaches, neck pain, palpitations or shortness of breath. Risk factors for coronary artery disease include dyslipidemia, family history, diabetes mellitus, male gender and sedentary lifestyle. Past treatments include calcium channel blockers. Hypertensive end-organ damage includes PVD and retinopathy.     Review of Systems  Constitutional: Negative for chills, fatigue, fever and unexpected weight change.  HENT: Negative for dental problem, mouth sores and trouble swallowing.   Eyes: Positive for blurred vision. Negative for visual disturbance.  Respiratory: Negative for cough, choking, chest tightness, shortness of breath and wheezing.   Cardiovascular: Negative for chest pain, palpitations and leg swelling.  Gastrointestinal: Negative for abdominal distention, abdominal pain, constipation, diarrhea, nausea and vomiting.  Endocrine: Positive for polydipsia and polyuria. Negative for polyphagia.   Genitourinary: Negative for dysuria, flank pain, hematuria and urgency.  Musculoskeletal: Negative for back pain, gait problem, myalgias and neck pain.  Skin: Negative for pallor, rash and wound.  Neurological: Positive for tremors. Negative for seizures, syncope, weakness, numbness and headaches.  Psychiatric/Behavioral: Negative for confusion and dysphoric mood. The patient is nervous/anxious.     Objective:    Vitals with BMI 05/05/2020 03/15/2020 01/20/2020  Height _0  _1  -  Weight 174 lbs 3 oz 175 lbs 174 lbs 2 oz  BMI 25.71 75.10 25.8  Systolic 527 782 423  Diastolic 84 536 92  Pulse 72 85 85    BP 120/84   Pulse 72   Ht _2  (1.753 m)   Wt 174 lb 3.2 oz (79 kg)   BMI 25.72 kg/m   Wt Readings from Last 3 Encounters:  05/05/20 174 lb 3.2 oz (79 kg)  03/15/20 175 lb (79.4 kg)  01/20/20 174 lb 1.6 oz (79 kg)     Physical Exam Constitutional:      General: He is not in acute distress.    Appearance: He is well-developed.  HENT:     Head: Normocephalic and atraumatic.  Neck:     Thyroid: No thyromegaly.     Trachea: No tracheal deviation.  Cardiovascular:     Rate and Rhythm: Normal rate.     Pulses:          Dorsalis pedis pulses are 1+ on the right side and 1+ on the left side.       Posterior tibial pulses are 1+ on the right side and 1+ on the left side.     Heart sounds: Normal heart sounds, S1 normal and S2 normal. No murmur heard.  No gallop.   Pulmonary:     Effort: Pulmonary effort is normal. No respiratory distress.     Breath sounds: No wheezing.  Abdominal:     General: There is no distension.     Tenderness: There is no abdominal tenderness. There is no guarding.  Musculoskeletal:     Right shoulder: No swelling or deformity.     Cervical back: Normal range of motion and neck supple.     Comments: Signs of poor peripheral circulation.  Has diminished monofilament test sensation as well as diminished dorsalis pedis and posterior tibial arterial  pulses.  He does have long nails, calluses on his  feet.    Skin:    General: Skin is warm and dry.     Findings: No rash.     Nails: There is no clubbing.  Neurological:     Mental Status: He is alert and oriented to person, place, and time.     Cranial Nerves: No cranial nerve deficit.     Sensory: No sensory deficit.     Gait: Gait normal.     Deep Tendon Reflexes: Reflexes are normal and symmetric.  Psychiatric:        Speech: Speech normal.        Behavior: Behavior normal. Behavior is cooperative.        Thought Content: Thought content normal.        Judgment: Judgment normal.       CMP ( most recent) CMP     Component Value Date/Time   NA 138 12/30/2019 1218   K 4.6 12/30/2019 1218   CL 103 12/30/2019 1218   CO2 27 12/30/2019 1218   GLUCOSE 256 (H) 12/30/2019 1218   BUN 21 (H) 12/30/2019 1218   CREATININE 1.11 12/30/2019 1218   CALCIUM 9.4 12/30/2019 1218   PROT 7.8 12/30/2019 1218   ALBUMIN 3.7 12/30/2019 1218   AST 16 12/30/2019 1218   ALT 22 12/30/2019 1218   ALKPHOS 65 12/30/2019 1218   BILITOT 0.6 12/30/2019 1218   GFRNONAA >60 12/30/2019 1218   GFRAA >60 12/30/2019 1218     Diabetic Labs (most recent): Lab Results  Component Value Date   HGBA1C 10.0 (A) 05/05/2020   HGBA1C 10.0 (H) 12/05/2019     Lipid Panel ( most recent) Lipid Panel     Component Value Date/Time   CHOL 212 (H) 12/30/2019 1218   TRIG 74 12/30/2019 1218   HDL 48 12/30/2019 1218   CHOLHDL 4.4 12/30/2019 1218   VLDL 15 12/30/2019 1218   LDLCALC 149 (H) 12/30/2019 1218      Assessment & Plan:   1. Type 2 diabetes, uncontrolled, with retinopathy with macular edema ([facility])  - Xan Ingraham has currently uncontrolled symptomatic type 2 DM since  59 years of age. He did not bring any logs nor meter to review with him.  Is not sure when was the last time he monitors blood glucose.  His point-of-care A1c is 10% today on May 05, 2020.Recent labs reviewed. - I had a long  discussion with him about the progressive nature of diabetes and the pathology behind its complications. -his diabetes is complicated by retinopathy, peripheral arterial disease, peripheral neuropathy and he remains at a high risk for more acute and chronic complications which include CAD, CVA, CKD, retinopathy, and neuropathy. These are all discussed in detail with him.  - I have counseled him on diet  and weight management  by adopting a carbohydrate restricted/protein rich diet. Patient is encouraged to switch to  unprocessed or minimally processed     complex starch and increased protein intake (animal or plant source), fruits, and vegetables. -  he is advised to stick to a routine mealtimes to eat 3 meals  a day and avoid unnecessary snacks ( to snack only to correct hypoglycemia).   - he admits that there is a room for improvement in his food and drink choices. - Suggestion is made for him to avoid simple carbohydrates  from his diet including Cakes, Sweet Desserts, Ice Cream, Soda (diet and regular), Sweet Tea, Candies, Chips, Cookies, Store Bought Juices, Alcohol in Excess of  1-2  drinks a day, Artificial Sweeteners,  Coffee Creamer, and "Sugar-free" Products. This will help patient to have more stable blood glucose profile and potentially avoid unintended weight gain.  - he will be scheduled with Jearld Fenton, RDN, CDE for diabetes education.  - I have approached him with the following individualized plan to manage  his diabetes and patient agrees:   -In light of his current and prevailing glycemic burden, he will likely require multiple daily injections of insulin utilizing basal/bolus insulin. -His wife is concerned about hypoglycemia, this approach will be done very slowly.  -In preparation, he is advised to keep his Lantus at 20 units nightly, associated with monitoring of blood glucose strictly  4 times a day-before meals and at bedtime, and return in 7-10 days for reevaluation with  his meter and logs. -This patient would benefit from a CGM given his visual limitation.  This will be considered during his next visit. - he is warned not to take insulin without proper monitoring per orders.  - he is encouraged to call clinic for blood glucose levels less than 70 or above 300 mg /dl. -He will benefit from low-dose glipizide.  I discussed and initiated glipizide 2.5 mg XL p.o. daily with breakfast. -He will also be considered for low-dose Metformin on subsequent visits.  He is not a candidate for incretin therapy nor SGLT2 inhibitors.  - Specific targets for  A1c;  LDL, HDL,  and Triglycerides were discussed with the patient.  2) Blood Pressure /Hypertension:  his blood pressure is  controlled to target.   he is advised to continue his current medications including amlodipine 10 mg p.o.. daily with breakfast . 3) Lipids/Hyperlipidemia:   Review of his recent lipid panel showed un controlled  LDL at 149.  he  Is not on a statin intervention.  I discussed and prescribed Crestor 10 mg p.o. nightly.  Side effects and precautions discussed with him.  4)  Weight/Diet:  Body mass index is 25.72 kg/m.  -   he is not a candidate for weight loss. Exercise, and detailed carbohydrates information provided  -  detailed on discharge instructions.  5) Chronic Care/Health Maintenance:  -he  is on  Statin medications and  is encouraged to initiate and continue to follow up with Ophthalmology, Dentist,  Podiatrist at least yearly or according to recommendations, and advised to   stay away from smoking. I have recommended yearly flu vaccine and pneumonia vaccine at least every 5 years; moderate intensity exercise for up to 150 minutes weekly; and  sleep for at least 7 hours a day.  - he is  advised to maintain close follow up with Patient, No Pcp Per for primary care needs, as well as his other providers for optimal and coordinated care.   - Time spent in this patient care: 60 min, of which >  50% was spent in  counseling  him about his currently uncontrolled, complicated type 2 diabetes, hyperlipidemia, hypertension and the rest reviewing his blood glucose logs , discussing his hypoglycemia and hyperglycemia episodes, reviewing his current and  previous labs / studies  ( including abstraction from other facilities) and medications  doses and developing a  long term treatment plan based on the latest standards of care/ guidelines; and documenting his care.    Please refer to Patient Instructions for Blood Glucose Monitoring and Insulin/Medications Dosing Guide"  in media tab for additional information. Please  also refer to " Patient Self Inventory" in the Media  tab  for reviewed elements of pertinent patient history.  Bradd Canary participated in the discussions, expressed understanding, and voiced agreement with the above plans.  All questions were answered to his satisfaction. he is encouraged to contact clinic should he have any questions or concerns prior to his return visit.   Follow up plan: - Return in about 1 week (around 05/12/2020) for Bring Meter and Logs- A1c in Office.  Glade Lloyd, MD [facility] [address] [place], [place] [address] Phone: [phone]  Fax: [phone]    05/05/2020, 3:25 PM  This note was partially dictated with voice recognition software. Similar sounding words can be transcribed inadequately or may not  be corrected upon review.

## 2020-05-12 ENCOUNTER — Ambulatory Visit: Payer: 59 | Admitting: "Endocrinology

## 2020-05-19 ENCOUNTER — Other Ambulatory Visit: Payer: Self-pay

## 2020-05-19 ENCOUNTER — Encounter: Payer: Self-pay | Admitting: "Endocrinology

## 2020-05-19 ENCOUNTER — Ambulatory Visit (INDEPENDENT_AMBULATORY_CARE_PROVIDER_SITE_OTHER): Payer: 59 | Admitting: "Endocrinology

## 2020-05-19 VITALS — BP 135/87 | HR 80 | Ht 69.0 in | Wt 174.0 lb

## 2020-05-19 DIAGNOSIS — E782 Mixed hyperlipidemia: Secondary | ICD-10-CM

## 2020-05-19 DIAGNOSIS — E11311 Type 2 diabetes mellitus with unspecified diabetic retinopathy with macular edema: Secondary | ICD-10-CM

## 2020-05-19 DIAGNOSIS — E1159 Type 2 diabetes mellitus with other circulatory complications: Secondary | ICD-10-CM | POA: Diagnosis not present

## 2020-05-19 DIAGNOSIS — IMO0002 Reserved for concepts with insufficient information to code with codable children: Secondary | ICD-10-CM

## 2020-05-19 DIAGNOSIS — I10 Essential (primary) hypertension: Secondary | ICD-10-CM | POA: Diagnosis not present

## 2020-05-19 DIAGNOSIS — E1165 Type 2 diabetes mellitus with hyperglycemia: Secondary | ICD-10-CM

## 2020-05-19 MED ORDER — GLIPIZIDE ER 5 MG PO TB24: 5.0000 mg | ORAL_TABLET | Freq: Every day | ORAL | 1 refills | Status: DC

## 2020-05-19 MED ORDER — INSULIN GLARGINE 100 UNIT/ML ~~LOC~~ SOLN: 24.0000 [IU] | Freq: Every day | SUBCUTANEOUS | 1 refills | Status: DC

## 2020-05-19 MED ORDER — FREESTYLE LIBRE 2 READER DEVI
0 refills | Status: DC
Start: 2020-05-19 — End: 2023-02-12

## 2020-05-19 MED ORDER — FREESTYLE LIBRE 2 SENSOR MISC
1.0000 | 3 refills | Status: DC
Start: 2020-05-19 — End: 2020-10-05

## 2020-05-19 NOTE — Progress Notes (Signed)
Endocrinology Consult Note       05/19/2020, 9:13 PM   Subjective:    Patient ID: Chad Kirk, male    DOB: 07-09-1961.  Palmer Fahrner is being seen in consultation for management of currently uncontrolled symptomatic diabetes requested by  Patient, No Pcp Per.   Past Medical History:  Diagnosis Date  . Diabetes mellitus without complication ([place])    DIAGNOSED AGE 59  . Hypertension     Past Surgical History:  Procedure Laterality Date  . ESOPHAGEAL DILATION N/A 12/07/2019   Procedure: ESOPHAGEAL OR PYLORIC DILATION;  Surgeon: Danie Binder, MD;  Location: [facility];  Service: Endoscopy;  Laterality: N/A;  . ESOPHAGOGASTRODUODENOSCOPY N/A 12/07/2019   erosive gastritis, many non-bleeding cratered and superficial duodenal ulcers without stigmata of bleeding in duodenal bulb. Empiric dilation due to possible occult esophageal web. Negative H.pylori.   . EYE SURGERY  03/02/2020   diabetic retinopathy, lens placement, cataract removal, right eye  . HERNIA REPAIR Left 1990    Social History   Socioeconomic History  . Marital status: Married    Spouse name: Not on file  . Number of children: Not on file  . Years of education: Not on file  . Highest education level: Not on file  Occupational History  . Not on file  Tobacco Use  . Smoking status: Former Smoker    Packs/day: 1.00    Years: 5.00    Pack years: 5.00    Quit date: 08/20/1988    Years since quitting: 31.7  . Smokeless tobacco: Never Used  Vaping Use  . Vaping Use: Never used  Substance and Sexual Activity  . Alcohol use: Not Currently    Comment: denied 03/15/20  . Drug use: No  . Sexual activity: Not on file  Other Topics Concern  . Not on file  Social History Narrative  . Not on file   Social Determinants of Health   Financial Resource Strain:   . Difficulty of Paying Living Expenses: Not on file  Food Insecurity:   .  Worried About Charity fundraiser in the Last Year: Not on file  . Ran Out of Food in the Last Year: Not on file  Transportation Needs:   . Lack of Transportation (Medical): Not on file  . Lack of Transportation (Non-Medical): Not on file  Physical Activity:   . Days of Exercise per Week: Not on file  . Minutes of Exercise per Session: Not on file  Stress:   . Feeling of Stress : Not on file  Social Connections:   . Frequency of Communication with Friends and Family: Not on file  . Frequency of Social Gatherings with Friends and Family: Not on file  . Attends Religious Services: Not on file  . Active Member of Clubs or Organizations: Not on file  . Attends Archivist Meetings: Not on file  . Marital Status: Not on file    Family History  Problem Relation Age of Onset  . Stroke Mother   . Hypertension Mother   . Diabetes Mother   . Hypertension Father   . Diabetes Father   .  Colon cancer Neg Hx   . Colon polyps Neg Hx     Outpatient Encounter Medications as of 05/19/2020  Medication Sig  . amLODipine (NORVASC) 10 MG tablet Take 1 tablet (10 mg total) by mouth daily.  . Blood Glucose Monitoring Suppl (ACCU-CHEK GUIDE ME) w/Device KIT 1 Piece by Does not apply route as directed.  . brimonidine (ALPHAGAN) 0.2 % ophthalmic solution Place 1 drop into the left eye 3 (three) times daily.  . Carboxymethylcellulose Sodium (LUBRICANT EYE DROPS OP) Apply 1 Container to eye as needed (dry eye). Left eye  . Continuous Blood Gluc Receiver (FREESTYLE LIBRE 2 READER) DEVI As directed  . Continuous Blood Gluc Sensor (FREESTYLE LIBRE 2 SENSOR) MISC 1 Piece by Does not apply route every 14 (fourteen) days.  . dorzolamide (TRUSOPT) 2 % ophthalmic solution Place 1 drop into the left eye 3 (three) times daily.  . finasteride (PROSCAR) 5 MG tablet Take 1 tablet (5 mg total) by mouth daily.  Marland Kitchen glipiZIDE (GLUCOTROL XL) 5 MG 24 hr tablet Take 1 tablet (5 mg total) by mouth daily with breakfast.   . glucose blood (ACCU-CHEK GUIDE) test strip Use as instructed  . insulin glargine (LANTUS) 100 UNIT/ML injection Inject 0.24 mLs (24 Units total) into the skin at bedtime.  . Insulin Pen Needle (B-D ULTRAFINE III SHORT PEN) 31G X 8 MM MISC 1 each by Does not apply route as directed.  . Multiple Vitamins-Minerals (QC DAILY MULTIVIT/MULTIMINERAL PO) Take 1 tablet by mouth daily.  . Probiotic Product (PROBIOTIC PO) Take 1 tablet by mouth daily.  . rosuvastatin (CRESTOR) 10 MG tablet Take 1 tablet (10 mg total) by mouth daily.  Marland Kitchen senna-docusate (SENOKOT-S) 8.6-50 MG tablet Take 1 tablet by mouth as needed.   . tamsulosin (FLOMAX) 0.4 MG CAPS capsule Take 1 capsule (0.4 mg total) by mouth daily.  . Timolol Maleate 0.5 % (DAILY) SOLN Apply 1 drop to eye in the morning, at noon, and at bedtime. In left eye  . [DISCONTINUED] glipiZIDE (GLUCOTROL XL) 2.5 MG 24 hr tablet Take 1 tablet (2.5 mg total) by mouth daily with breakfast.  . [DISCONTINUED] insulin glargine (LANTUS) 100 UNIT/ML injection Inject 0.2 mLs (20 Units total) into the skin at bedtime.   No facility-administered encounter medications on file as of 05/19/2020.    ALLERGIES: No Known Allergies  VACCINATION STATUS:  There is no immunization history on file for this patient.  Diabetes He presents for his follow-up diabetic visit. He has type 2 diabetes mellitus. Onset time: He was diagnosed at approximate age of 77 years. His disease course has been worsening. Hypoglycemia symptoms include nervousness/anxiousness, sweats and tremors. Pertinent negatives for hypoglycemia include no confusion, headaches, pallor or seizures. Associated symptoms include blurred vision. Pertinent negatives for diabetes include no chest pain, no fatigue, no polydipsia, no polyphagia, no polyuria and no weakness. There are no hypoglycemic complications. Symptoms are improving. Diabetic complications include nephropathy, peripheral neuropathy, PVD and retinopathy.  (He is reporting legal blindness from diabetic retinopathy on both eyes.) Risk factors for coronary artery disease include dyslipidemia, diabetes mellitus, family history, male sex, hypertension and sedentary lifestyle. Current diabetic treatments: He is currently on Lantus 15-20 units 1-2 times a day. His weight is fluctuating minimally. He is following a generally unhealthy diet. When asked about meal planning, he reported none. His breakfast blood glucose range is generally 180-200 mg/dl. His lunch blood glucose range is generally 180-200 mg/dl. His dinner blood glucose range is generally 180-200 mg/dl. His bedtime  blood glucose range is generally 180-200 mg/dl. His overall blood glucose range is 180-200 mg/dl. (He presents with above target, however improved glycemic profile averaging between 175-200 mg per DL.  He did have some rare, random mild hypoglycemia due to missed meals or late meals.      His point-of-care A1c is 10%  on May 05, 2020.) He does not see a podiatrist.Eye exam is current.  Hyperlipidemia This is a chronic problem. The current episode started more than 1 year ago. The problem is uncontrolled. Recent lipid tests were reviewed and are high. Pertinent negatives include no chest pain, myalgias or shortness of breath. Risk factors for coronary artery disease include diabetes mellitus, dyslipidemia, hypertension, male sex, a sedentary lifestyle and family history.  Hypertension Associated symptoms include blurred vision and sweats. Pertinent negatives include no chest pain, headaches, neck pain, palpitations or shortness of breath. Risk factors for coronary artery disease include dyslipidemia, family history, diabetes mellitus, male gender and sedentary lifestyle. Past treatments include calcium channel blockers. Hypertensive end-organ damage includes PVD and retinopathy.     Review of Systems  Constitutional: Negative for chills, fatigue, fever and unexpected weight change.   HENT: Negative for dental problem, mouth sores and trouble swallowing.   Eyes: Positive for blurred vision. Negative for visual disturbance.  Respiratory: Negative for cough, choking, chest tightness, shortness of breath and wheezing.   Cardiovascular: Negative for chest pain, palpitations and leg swelling.  Gastrointestinal: Negative for abdominal distention, abdominal pain, constipation, diarrhea, nausea and vomiting.  Endocrine: Negative for polydipsia, polyphagia and polyuria.  Genitourinary: Negative for dysuria, flank pain, hematuria and urgency.  Musculoskeletal: Negative for back pain, gait problem, myalgias and neck pain.  Skin: Negative for pallor, rash and wound.  Neurological: Positive for tremors. Negative for seizures, syncope, weakness, numbness and headaches.  Psychiatric/Behavioral: Negative for confusion and dysphoric mood. The patient is nervous/anxious.     Objective:    Vitals with BMI 05/19/2020 05/05/2020 03/15/2020  Height $Remov'5\' 9"'ZYxeja$  $Remove'5\' 9"'CqMWBan$  $RemoveB'5\' 9"'xLLEhFvW$   Weight 174 lbs 174 lbs 3 oz 175 lbs  BMI 25.68 96.78 93.81  Systolic 017 510 258  Diastolic 87 84 527  Pulse 80 72 85    BP 135/87   Pulse 80   Ht $R'5\' 9"'La$  (1.753 m)   Wt 174 lb (78.9 kg)   BMI 25.70 kg/m   Wt Readings from Last 3 Encounters:  05/19/20 174 lb (78.9 kg)  05/05/20 174 lb 3.2 oz (79 kg)  03/15/20 175 lb (79.4 kg)     Physical Exam Constitutional:      General: He is not in acute distress.    Appearance: He is well-developed.  HENT:     Head: Normocephalic and atraumatic.  Neck:     Thyroid: No thyromegaly.     Trachea: No tracheal deviation.  Cardiovascular:     Rate and Rhythm: Normal rate.     Pulses:          Dorsalis pedis pulses are 1+ on the right side and 1+ on the left side.       Posterior tibial pulses are 1+ on the right side and 1+ on the left side.     Heart sounds: Normal heart sounds, S1 normal and S2 normal. No murmur heard.  No gallop.   Pulmonary:     Effort: Pulmonary effort  is normal. No respiratory distress.     Breath sounds: No wheezing.  Abdominal:     General: There is no distension.  Tenderness: There is no abdominal tenderness. There is no guarding.  Musculoskeletal:     Right shoulder: No swelling or deformity.     Cervical back: Normal range of motion and neck supple.     Comments: Signs of poor peripheral circulation.  Has diminished monofilament test sensation as well as diminished dorsalis pedis and posterior tibial arterial pulses.  He does have long nails, calluses on his feet.    Skin:    General: Skin is warm and dry.     Findings: No rash.     Nails: There is no clubbing.  Neurological:     Mental Status: He is alert and oriented to person, place, and time.     Cranial Nerves: No cranial nerve deficit.     Sensory: No sensory deficit.     Gait: Gait normal.     Deep Tendon Reflexes: Reflexes are normal and symmetric.  Psychiatric:        Speech: Speech normal.        Behavior: Behavior normal. Behavior is cooperative.        Thought Content: Thought content normal.        Judgment: Judgment normal.       CMP ( most recent) CMP     Component Value Date/Time   NA 138 12/30/2019 1218   K 4.6 12/30/2019 1218   CL 103 12/30/2019 1218   CO2 27 12/30/2019 1218   GLUCOSE 256 (H) 12/30/2019 1218   BUN 21 (H) 12/30/2019 1218   CREATININE 1.11 12/30/2019 1218   CALCIUM 9.4 12/30/2019 1218   PROT 7.8 12/30/2019 1218   ALBUMIN 3.7 12/30/2019 1218   AST 16 12/30/2019 1218   ALT 22 12/30/2019 1218   ALKPHOS 65 12/30/2019 1218   BILITOT 0.6 12/30/2019 1218   GFRNONAA >60 12/30/2019 1218   GFRAA >60 12/30/2019 1218     Diabetic Labs (most recent): Lab Results  Component Value Date   HGBA1C 10.0 (A) 05/05/2020   HGBA1C 10.0 (H) 12/05/2019     Lipid Panel ( most recent) Lipid Panel     Component Value Date/Time   CHOL 212 (H) 12/30/2019 1218   TRIG 74 12/30/2019 1218   HDL 48 12/30/2019 1218   CHOLHDL 4.4 12/30/2019  1218   VLDL 15 12/30/2019 1218   LDLCALC 149 (H) 12/30/2019 1218      Assessment & Plan:   1. Type 2 diabetes, uncontrolled, with retinopathy with macular edema ([facility])  - Anthoney Sheppard has currently uncontrolled symptomatic type 2 DM since  59 years of age. He presents with above target, however improved glycemic profile averaging between 175-200 mg per DL.  He did have some rare, random mild hypoglycemia due to missed meals or late meals.     His point-of-care A1c is 10% on May 05, 2020.  - I had a long discussion with him about the progressive nature of diabetes and the pathology behind its complications. -his diabetes is complicated by retinopathy, peripheral arterial disease, peripheral neuropathy and he remains at a high risk for more acute and chronic complications which include CAD, CVA, CKD, retinopathy, and neuropathy. These are all discussed in detail with him.  - I have counseled him on diet  and weight management  by adopting a carbohydrate restricted/protein rich diet. Patient is encouraged to switch to  unprocessed or minimally processed     complex starch and increased protein intake (animal or plant source), fruits, and vegetables. -  he is advised to stick  to a routine mealtimes to eat 3 meals  a day and avoid unnecessary snacks ( to snack only to correct hypoglycemia).   - he  admits there is a room for improvement in his diet and drink choices. -  Suggestion is made for him to avoid simple carbohydrates  from his diet including Cakes, Sweet Desserts / Pastries, Ice Cream, Soda (diet and regular), Sweet Tea, Candies, Chips, Cookies, Sweet Pastries,  Store Bought Juices, Alcohol in Excess of  1-2 drinks a day, Artificial Sweeteners, Coffee Creamer, and "Sugar-free" Products. This will help patient to have stable blood glucose profile and potentially avoid unintended weight gain.   - he will be scheduled with Jearld Fenton, RDN, CDE for diabetes education.  - I have  approached him with the following individualized plan to manage  his diabetes and patient agrees:   -In light of his presentation with significantly improved glycemic profile, he will not need prandial insulin for now.    -However, he will continue to need basal insulin, advised to increase Lantus to 24 units nightly, associated with monitoring of blood glucose strictly  4 times a day-before meals and at bedtime. -This patient will benefit from a CGM device.  I discussed and prescribed the freestyle libre device for him.  - he is warned not to take insulin without proper monitoring per orders.  - he is encouraged to call clinic for blood glucose levels less than 70 or above 300 mg /dl. -He has benefited from glipizide treatment.  I discussed and increase his glipizide to 5 mg XL p.o. daily at breakfast.  -He will also be considered for low-dose Metformin on subsequent visits.  He is not a candidate for incretin therapy nor SGLT2 inhibitors.  - Specific targets for  A1c;  LDL, HDL,  and Triglycerides were discussed with the patient.  2) Blood Pressure /Hypertension: His blood pressure is controlled to target. he is advised to continue his current medications including amlodipine 10 mg p.o.. daily with breakfast . 3) Lipids/Hyperlipidemia:   Review of his recent lipid panel showed un controlled  LDL at 149.  he  is not on a statin intervention.  He is advised to continue Crestor 10 mg p.o. nightly.  Side effects and precautions discussed with him.  4)  Weight/Diet:  Body mass index is 25.7 kg/m.  -   he is not a candidate for weight loss. Exercise, and detailed carbohydrates information provided  -  detailed on discharge instructions.  5) Chronic Care/Health Maintenance:  -he  is on  Statin medications and  is encouraged to initiate and continue to follow up with Ophthalmology, Dentist,  Podiatrist at least yearly or according to recommendations, and advised to   stay away from smoking. I have  recommended yearly flu vaccine and pneumonia vaccine at least every 5 years; moderate intensity exercise for up to 150 minutes weekly; and  sleep for at least 7 hours a day.  POCT ABI Results 05/19/20  His BMI is normal. Right ABI: 1.26      left ABI: 1.23  Right leg systolic / diastolic: 176/16 mmHg Left leg systolic / diastolic: 073/71 mmHg  Arm systolic / diastolic: 062/69 mmHG   - he is  advised to maintain close follow up with his pcp for primary care needs, as well as his other providers for optimal and coordinated care.  - Time spent on this patient care encounter:  40 min, of which > 50% was spent in  counseling and  the rest reviewing his blood glucose logs , discussing his hypoglycemia and hyperglycemia episodes, reviewing his current and  previous labs / studies  ( including abstraction from other facilities) and medications  doses and developing a  long term treatment plan and documenting his care.   Please refer to Patient Instructions for Blood Glucose Monitoring and Insulin/Medications Dosing Guide"  in media tab for additional information. Please  also refer to " Patient Self Inventory" in the Media  tab for reviewed elements of pertinent patient history.  Bradd Canary participated in the discussions, expressed understanding, and voiced agreement with the above plans.  All questions were answered to his satisfaction. he is encouraged to contact clinic should he have any questions or concerns prior to his return visit.    Follow up plan: - Return for Bring Meter and Logs- A1c in Office, Urine MA - NV, A1c -NV.  Glade Lloyd, MD [facility] [address] [place], [place] [address] Phone: [phone]  Fax: [phone]    05/19/2020, 9:13 PM  This note was partially dictated with voice recognition software. Similar sounding words can be transcribed inadequately or may not  be corrected upon review.

## 2020-05-19 NOTE — Patient Instructions (Signed)

## 2020-05-24 ENCOUNTER — Ambulatory Visit (INDEPENDENT_AMBULATORY_CARE_PROVIDER_SITE_OTHER): Payer: 59 | Admitting: "Endocrinology

## 2020-05-24 ENCOUNTER — Other Ambulatory Visit: Payer: Self-pay

## 2020-05-24 DIAGNOSIS — E1165 Type 2 diabetes mellitus with hyperglycemia: Secondary | ICD-10-CM

## 2020-05-24 DIAGNOSIS — Z961 Presence of intraocular lens: Secondary | ICD-10-CM | POA: Insufficient documentation

## 2020-05-24 DIAGNOSIS — IMO0002 Reserved for concepts with insufficient information to code with codable children: Secondary | ICD-10-CM

## 2020-05-24 DIAGNOSIS — E11311 Type 2 diabetes mellitus with unspecified diabetic retinopathy with macular edema: Secondary | ICD-10-CM | POA: Diagnosis not present

## 2020-05-24 NOTE — Progress Notes (Signed)
Patient brought in [facility] 2 device and sensor for instruction on using device and applying sensor. Patient was instructed and demonstrated proper technique with device and sensor application, verbalized understanding, will call if any questions or concerns.

## 2020-06-08 ENCOUNTER — Ambulatory Visit: Payer: 59 | Admitting: Podiatry

## 2020-06-28 ENCOUNTER — Encounter: Payer: Self-pay | Admitting: Nutrition

## 2020-06-28 ENCOUNTER — Other Ambulatory Visit: Payer: Self-pay

## 2020-06-28 ENCOUNTER — Encounter: Payer: 59 | Attending: "Endocrinology | Admitting: Nutrition

## 2020-06-28 VITALS — Ht 69.0 in | Wt 177.0 lb

## 2020-06-28 DIAGNOSIS — E1069 Type 1 diabetes mellitus with other specified complication: Secondary | ICD-10-CM | POA: Insufficient documentation

## 2020-06-28 DIAGNOSIS — E782 Mixed hyperlipidemia: Secondary | ICD-10-CM | POA: Insufficient documentation

## 2020-06-28 DIAGNOSIS — I10 Essential (primary) hypertension: Secondary | ICD-10-CM | POA: Insufficient documentation

## 2020-06-28 NOTE — Progress Notes (Signed)
  Medical Nutrition Therapy:  Appt start time: 1530 end time:  1630.  Assessment:  Primary concerns today: Diabetes Type 2. PMH: Hyperlipidemia, HTN and Retinopathy. A1C 10%. Currently taking Lantus 24 units , Glipizide in the am. Has recently cut out sodas. Recently reduced insulin due to improved blood sugars. Feels better. 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  Has Libre 2. Avg 155 ,g/dl. Doing excellent. Lives with his wife. Eat out and at home. Going to work on eating more at home and cut out fast food and processed foods and make better choices when eating out.    Lab Results  Component Value Date   HGBA1C 10.0 (A) 05/05/2020   CMP Latest Ref Rng & Units 12/30/2019 12/08/2019 12/06/2019  Glucose 70 - 99 mg/dL 097(D) 532(D) 924(Q)  BUN 6 - 20 mg/dL 68(T) 8 16  Creatinine 0.61 - 1.24 mg/dL 4.19 6.22 2.97  Sodium 135 - 145 mmol/L 138 133(L) 135  Potassium 3.5 - 5.1 mmol/L 4.6 3.9 4.0  Chloride 98 - 111 mmol/L 103 100 103  CO2 22 - 32 mmol/L 27 25 23   Calcium 8.9 - 10.3 mg/dL 9.4 ) 9.8(X)  Total Protein 6.5 - 8.1 g/dL 7.8 - 6.5  Total Bilirubin 0.3 - 1.2 mg/dL 0.6 - 1.2  Alkaline Phos 38 - 126 U/L 65 - 53  AST 15 - 41 U/L 16 - 14(L)  ALT 0 - 44 U/L 22 - 14   Lipid Panel     Component Value Date/Time   CHOL 212 (H) 12/30/2019 1218   TRIG 74 12/30/2019 1218   HDL 48 12/30/2019 1218   CHOLHDL 4.4 12/30/2019 1218   VLDL 15 12/30/2019 1218   LDLCALC 149 (H) 12/30/2019 1218     Preferred Learning Style:  Auditory  Visual  Hands on  Learning Readiness:    Ready  Change in progress   MEDICATIONS:   DIETARY INTAKE:  Eats 2-3 meals a day. Eats some out and some at home.   Usual physical activity: ADL   Estimated energy needs: 1600  calories 180 g carbohydrates 120 g protein 44 g fat  Progress Towards Goal(s):  In progress.   Nutritional Diagnosis:  NB-1.1 Food and nutrition-related knowledge deficit As  related to Diabetes Type 2 and obesity.  As evidenced by A1C 10%.    Intervention:  .Nutrition and Diabetes education provided on My Plate, CHO counting, meal planning, portion sizes, timing of meals, avoiding snacks between meals unless having a low blood sugar, target ranges for A1C and blood sugars, signs/symptoms and treatment of hyper/hypoglycemia, monitoring blood sugars, taking medications as prescribed, benefits of exercising 30 minutes per day and prevention of complications of DM.   Goals  Follow My Plate Eat three meals per day Don't skip meals Eat meals on time Eat 45-60 g CHO at each meal Drink only water Prevent low blood sugars. Take insulin as prescribed.   Teaching Method Utilized:  Visual Auditory Hands on  Handouts given during visit include:  The Plate Method   Meal Plan Card  Diabetes Instructions.  Barriers to learning/adherence to lifestyle change: none  Demonstrated degree of understanding via:  Teach Back   Monitoring/Evaluation:  Dietary intake, exercise, and body weight in 1 month(s).

## 2020-06-28 NOTE — Patient Instructions (Signed)
Goals  Follow My Plate Eat three meals per day Don't skip meals Eat meals on time Eat 45-60 g CHO at each meal Drink only water Prevent low blood sugars. Take insulin as prescribed.

## 2020-06-30 ENCOUNTER — Ambulatory Visit (INDEPENDENT_AMBULATORY_CARE_PROVIDER_SITE_OTHER): Payer: 59 | Admitting: Internal Medicine

## 2020-06-30 ENCOUNTER — Encounter: Payer: Self-pay | Admitting: Internal Medicine

## 2020-06-30 ENCOUNTER — Other Ambulatory Visit: Payer: Self-pay

## 2020-06-30 VITALS — BP 132/88 | HR 79 | Temp 97.8°F | Resp 18 | Ht 69.0 in | Wt 175.4 lb

## 2020-06-30 DIAGNOSIS — Z1211 Encounter for screening for malignant neoplasm of colon: Secondary | ICD-10-CM | POA: Insufficient documentation

## 2020-06-30 DIAGNOSIS — E1159 Type 2 diabetes mellitus with other circulatory complications: Secondary | ICD-10-CM

## 2020-06-30 DIAGNOSIS — Z7689 Persons encountering health services in other specified circumstances: Secondary | ICD-10-CM

## 2020-06-30 DIAGNOSIS — I10 Essential (primary) hypertension: Secondary | ICD-10-CM

## 2020-06-30 DIAGNOSIS — Z23 Encounter for immunization: Secondary | ICD-10-CM

## 2020-06-30 DIAGNOSIS — N400 Enlarged prostate without lower urinary tract symptoms: Secondary | ICD-10-CM

## 2020-06-30 DIAGNOSIS — Z794 Long term (current) use of insulin: Secondary | ICD-10-CM

## 2020-06-30 DIAGNOSIS — K269 Duodenal ulcer, unspecified as acute or chronic, without hemorrhage or perforation: Secondary | ICD-10-CM

## 2020-06-30 DIAGNOSIS — E782 Mixed hyperlipidemia: Secondary | ICD-10-CM

## 2020-06-30 DIAGNOSIS — E11311 Type 2 diabetes mellitus with unspecified diabetic retinopathy with macular edema: Secondary | ICD-10-CM

## 2020-06-30 DIAGNOSIS — I739 Peripheral vascular disease, unspecified: Secondary | ICD-10-CM | POA: Insufficient documentation

## 2020-06-30 NOTE — Assessment & Plan Note (Signed)
On Crestor Lipid profile in the next visit

## 2020-06-30 NOTE — Assessment & Plan Note (Signed)
HbA1C:  On Lantus 24 U qHS and Glipizide 5 mg QD Advised to follow diabetic diet On statin F/u CMP and lipid panel in the next visit Diabetic eye exam: Advised to continue to follow up with Ophthalmology for diabetic eye exam

## 2020-06-30 NOTE — Assessment & Plan Note (Addendum)
Related to uncontrolled DM Held Aspirin for now due to recent h/o duodenal ulcer and GERD Advised to check feet at least once in a day, and contact if he notices lesion/ulcer

## 2020-06-30 NOTE — Assessment & Plan Note (Addendum)
BP Readings from Last 1 Encounters:  06/30/20 132/88   Well-controlled with Amlodipine ACEi/ARB would be a better choice for DM and nephropathy, will switch later Counseled for compliance with the medications Advised DASH diet and moderate exercise/walking, at least 150 mins/week

## 2020-06-30 NOTE — Assessment & Plan Note (Signed)
Was on PPI Aspirin held for now

## 2020-06-30 NOTE — Patient Instructions (Signed)
Please continue to take medications as prescribed.  Please avoid skipping any meal and take insulin as instructed.  Please follow diabetic diet as suggested by Dr [name].  Continue to follow up with Ophthalmologist as scheduled.  You were given Pneumonia vaccine in the office today. You are advised to get booster dose of COVID vaccine and flu shot as well.

## 2020-06-30 NOTE — Assessment & Plan Note (Signed)
Follows up with Ophthalmologist 

## 2020-06-30 NOTE — Assessment & Plan Note (Signed)
Advised to f/u with GI for scheduling colonoscopy

## 2020-06-30 NOTE — Assessment & Plan Note (Signed)
Well-controlled with Flomax and Finasteride °

## 2020-06-30 NOTE — Assessment & Plan Note (Signed)
Care established Previous chart reviewed History and medications reviewed with the patient 

## 2020-06-30 NOTE — Progress Notes (Signed)
New Patient Office Visit  Subjective:  Patient ID: Chad Kirk, male    DOB: 07-25-1961  Age: 59 y.o. MRN: [id number]  CC:  Chief Complaint  Patient presents with  . New Patient (Initial Visit)    new pt was going to [facility] just establishing care     HPI Chad Kirk is 59 year old male with past medical history of uncontrolled diabetes mellitus type 2, PVD, hypertension, hyperlipidemia, diabetic macular edema, BPH and alcohol abuse presents for establishing care.  He states that he has been in good health overall.  He follows up with Dr. Dorris Fetch for diabetes management.  He takes Lantus regularly as instructed and has a continuous glucose monitor.  Blood glucose readings were reviewed with the patient.  He is advised to follow diabetic diet as instructed.  He follows up with ophthalmologist for diabetic macular edema.  He used to take aspirin in the past.  He has a history of PVD considering weak pulse in the LE.  He had peptic ulcer found during the EGD earlier in this year, after which he was placed on PPI and aspirin was stopped at that point.  He has a history of BPH, for which he takes Flomax and finasteride.  He denies any dysuria, hematuria, urinary frequency or hesitancy.  He is due for colonoscopy.  He is advised to follow-up with his GI for screening colonoscopy.  He had pneumonia vaccine more than 5 years ago.  PPSV23 administered in the office today.  He denies flu vaccine despite explaining benefits as he thinks that he had caught flu when he received flu vaccine last time many years ago.  He has received both doses of COVID vaccine and is willing to get the booster dose soon.    Past Medical History:  Diagnosis Date  . Diabetes mellitus without complication ([place])    DIAGNOSED AGE 83  . Hypertension     Past Surgical History:  Procedure Laterality Date  . ESOPHAGEAL DILATION N/A 12/07/2019   Procedure: ESOPHAGEAL OR PYLORIC DILATION;  Surgeon:  Danie Binder, MD;  [facility];  Service: Endoscopy;  Laterality: N/A;  . ESOPHAGOGASTRODUODENOSCOPY N/A 12/07/2019   erosive gastritis, many non-bleeding cratered and superficial duodenal ulcers without stigmata of bleeding in duodenal bulb. Empiric dilation due to possible occult esophageal web. Negative H.pylori.   . EYE SURGERY  03/02/2020   diabetic retinopathy, lens placement, cataract removal, right eye  . HERNIA REPAIR Left 1990    Family History  Problem Relation Age of Onset  . Stroke Mother   . Hypertension Mother   . Diabetes Mother   . Hypertension Father   . Diabetes Father   . Colon cancer Neg Hx   . Colon polyps Neg Hx     Social History   Socioeconomic History  . Marital status: Married    Spouse name: Not on file  . Number of children: Not on file  . Years of education: Not on file  . Highest education level: Not on file  Occupational History  . Not on file  Tobacco Use  . Smoking status: Former Smoker    Packs/day: 1.00    Years: 5.00    Pack years: 5.00    Quit date: 08/20/1988    Years since quitting: 31.8  . Smokeless tobacco: Never Used  Vaping Use  . Vaping Use: Never used  Substance and Sexual Activity  . Alcohol use: Not Currently    Comment: denied 03/15/20  .  Drug use: No  . Sexual activity: Not on file  Other Topics Concern  . Not on file  Social History Narrative  . Not on file   Social Determinants of Health   Financial Resource Strain:   . Difficulty of Paying Living Expenses: Not on file  Food Insecurity:   . Worried About Charity fundraiser in the Last Year: Not on file  . Ran Out of Food in the Last Year: Not on file  Transportation Needs:   . Lack of Transportation (Medical): Not on file  . Lack of Transportation (Non-Medical): Not on file  Physical Activity:   . Days of Exercise per Week: Not on file  . Minutes of Exercise per Session: Not on file  Stress:   . Feeling of Stress : Not on file  Social  Connections:   . Frequency of Communication with Friends and Family: Not on file  . Frequency of Social Gatherings with Friends and Family: Not on file  . Attends Religious Services: Not on file  . Active Member of Clubs or Organizations: Not on file  . Attends Archivist Meetings: Not on file  . Marital Status: Not on file  Intimate Partner Violence:   . Fear of Current or Ex-Partner: Not on file  . Emotionally Abused: Not on file  . Physically Abused: Not on file  . Sexually Abused: Not on file    ROS Review of Systems  Constitutional: Negative for chills and fever.  HENT: Negative for congestion and sore throat.   Eyes: Positive for visual disturbance. Negative for pain and discharge.  Respiratory: Negative for cough and shortness of breath.   Cardiovascular: Negative for chest pain and palpitations.  Gastrointestinal: Negative for constipation, diarrhea, nausea and vomiting.  Endocrine: Negative for polydipsia and polyuria.  Genitourinary: Negative for dysuria and hematuria.  Musculoskeletal: Negative for neck pain and neck stiffness.  Skin: Negative for rash.  Neurological: Negative for dizziness, weakness, numbness and headaches.  Psychiatric/Behavioral: Negative for agitation and behavioral problems.    Objective:   Today's Vitals: BP 132/88 (BP Location: Right Arm, Patient Position: Sitting, Cuff Size: Normal)   Pulse 79   Temp 97.8 F (36.6 C) (Oral)   Resp 18   Ht '5\' 9"'  (1.753 m)   Wt 175 lb 6.4 oz (79.6 kg)   SpO2 100%   BMI 25.90 kg/m   Physical Exam Vitals reviewed.  Constitutional:      General: He is not in acute distress.    Appearance: He is not diaphoretic.  HENT:     Head: Normocephalic and atraumatic.     Nose: Nose normal.     Mouth/Throat:     Mouth: Mucous membranes are moist.  Eyes:     General: No scleral icterus.    Extraocular Movements: Extraocular movements intact.     Pupils: Pupils are equal, round, and reactive to  light.  Cardiovascular:     Rate and Rhythm: Normal rate and regular rhythm.     Heart sounds: Normal heart sounds. No murmur heard.      Comments: DPA pulse 1+ b/l Pulmonary:     Breath sounds: Normal breath sounds. No wheezing or rales.  Abdominal:     Palpations: Abdomen is soft.     Tenderness: There is no abdominal tenderness.  Musculoskeletal:     Cervical back: Neck supple. No tenderness.     Right lower leg: No edema.     Left lower leg: No edema.  Skin:    General: Skin is warm.     Findings: No rash.     Comments: No ulcer or skin breakdown on feet  Neurological:     General: No focal deficit present.     Mental Status: He is alert and oriented to person, place, and time.     Sensory: No sensory deficit.     Motor: No weakness.  Psychiatric:        Mood and Affect: Mood normal.        Behavior: Behavior normal.      Assessment & Plan:   Problem List Items Addressed This Visit      Encounter to establish care - Primary   Care established Previous chart reviewed History and medications reviewed with the patient       Cardiovascular and Mediastinum   Essential hypertension    BP Readings from Last 1 Encounters:  06/30/20 132/88   Well-controlled with Amlodipine ACEi/ARB would be a better choice for DM and nephropathy, will switch later Counseled for compliance with the medications Advised DASH diet and moderate exercise/walking, at least 150 mins/week       PVD (peripheral vascular disease) ([place])    Related to uncontrolled DM Held Aspirin for now due to recent h/o duodenal ulcer and GERD Advised to check feet at least once in a day, and contact if he notices lesion/ulcer        Digestive   Duodenal ulcer    Was on PPI Aspirin held for now        Endocrine   Diabetes mellitus ([place])    HbA1C:  On Lantus 24 U qHS and Glipizide 5 mg QD Advised to follow diabetic diet On statin F/u CMP and lipid panel in the next visit Diabetic eye exam:  Advised to continue to follow up with Ophthalmology for diabetic eye exam       Relevant Orders   Microalbumin, urine   Diabetic macular edema ([place])    Follows up with Ophthalmologist        Genitourinary   BPH (benign prostatic hyperplasia)    Well-controlled with Flomax and Finasteride        Other   Mixed hyperlipidemia    On Crestor Lipid profile in the next visit            Encounter for screening colonoscopy for non-high-risk patient    Advised to f/u with GI for scheduling colonoscopy         Outpatient Encounter Medications as of 06/30/2020  Medication Sig  . amLODipine (NORVASC) 10 MG tablet Take 1 tablet (10 mg total) by mouth daily.  . Blood Glucose Monitoring Suppl (ACCU-CHEK GUIDE ME) w/Device KIT 1 Piece by Does not apply route as directed.  . brimonidine (ALPHAGAN) 0.2 % ophthalmic solution Place 1 drop into the left eye 3 (three) times daily.  . Carboxymethylcellulose Sodium (LUBRICANT EYE DROPS OP) Apply 1 Container to eye as needed (dry eye). Left eye  . Continuous Blood Gluc Receiver (FREESTYLE LIBRE 2 READER) DEVI As directed  . Continuous Blood Gluc Sensor (FREESTYLE LIBRE 2 SENSOR) MISC 1 Piece by Does not apply route every 14 (fourteen) days.  . dorzolamide (TRUSOPT) 2 % ophthalmic solution Place 1 drop into the left eye 3 (three) times daily.  . finasteride (PROSCAR) 5 MG tablet Take 1 tablet (5 mg total) by mouth daily.  Marland Kitchen glipiZIDE (GLUCOTROL XL) 5 MG 24 hr tablet Take 1 tablet (5 mg total) by mouth daily  with breakfast.  . glucose blood (ACCU-CHEK GUIDE) test strip Use as instructed  . insulin glargine (LANTUS) 100 UNIT/ML injection Inject 0.24 mLs (24 Units total) into the skin at bedtime.  . Insulin Pen Needle (B-D ULTRAFINE III SHORT PEN) 31G X 8 MM MISC 1 each by Does not apply route as directed.  . Multiple Vitamins-Minerals (QC DAILY MULTIVIT/MULTIMINERAL PO) Take 1 tablet by mouth daily.  . Probiotic Product (PROBIOTIC PO) Take 1 tablet  by mouth daily.  . rosuvastatin (CRESTOR) 10 MG tablet Take 1 tablet (10 mg total) by mouth daily.  Marland Kitchen senna-docusate (SENOKOT-S) 8.6-50 MG tablet Take 1 tablet by mouth as needed.   . tamsulosin (FLOMAX) 0.4 MG CAPS capsule Take 1 capsule (0.4 mg total) by mouth daily.  . [DISCONTINUED] Timolol Maleate 0.5 % (DAILY) SOLN Apply 1 drop to eye in the morning, at noon, and at bedtime. In left eye (Patient not taking: Reported on 06/30/2020)   No facility-administered encounter medications on file as of 06/30/2020.    Follow-up: Return in about 4 months (around 10/28/2020).   [name], MD

## 2020-07-02 LAB — MICROALBUMIN, URINE: Microalbumin, Urine: 1021.3 ug/mL

## 2020-07-05 ENCOUNTER — Encounter: Payer: Self-pay | Admitting: Nutrition

## 2020-07-06 ENCOUNTER — Ambulatory Visit: Payer: 59 | Admitting: Internal Medicine

## 2020-07-06 ENCOUNTER — Ambulatory Visit (INDEPENDENT_AMBULATORY_CARE_PROVIDER_SITE_OTHER): Payer: 59 | Admitting: Podiatry

## 2020-07-06 ENCOUNTER — Other Ambulatory Visit: Payer: Self-pay

## 2020-07-06 DIAGNOSIS — B351 Tinea unguium: Secondary | ICD-10-CM

## 2020-07-06 DIAGNOSIS — E1142 Type 2 diabetes mellitus with diabetic polyneuropathy: Secondary | ICD-10-CM

## 2020-07-06 DIAGNOSIS — M79674 Pain in right toe(s): Secondary | ICD-10-CM

## 2020-07-06 DIAGNOSIS — M79675 Pain in left toe(s): Secondary | ICD-10-CM | POA: Diagnosis not present

## 2020-07-06 DIAGNOSIS — Z794 Long term (current) use of insulin: Secondary | ICD-10-CM | POA: Diagnosis not present

## 2020-07-07 ENCOUNTER — Encounter: Payer: Self-pay | Admitting: Internal Medicine

## 2020-07-07 ENCOUNTER — Telehealth (INDEPENDENT_AMBULATORY_CARE_PROVIDER_SITE_OTHER): Payer: 59 | Admitting: Internal Medicine

## 2020-07-07 DIAGNOSIS — N529 Male erectile dysfunction, unspecified: Secondary | ICD-10-CM

## 2020-07-07 DIAGNOSIS — I10 Essential (primary) hypertension: Secondary | ICD-10-CM | POA: Diagnosis not present

## 2020-07-07 DIAGNOSIS — N400 Enlarged prostate without lower urinary tract symptoms: Secondary | ICD-10-CM

## 2020-07-07 MED ORDER — TAMSULOSIN HCL 0.4 MG PO CAPS: 0.4000 mg | ORAL_CAPSULE | Freq: Every day | ORAL | 3 refills | Status: DC

## 2020-07-07 MED ORDER — LOSARTAN POTASSIUM 50 MG PO TABS: 50.0000 mg | ORAL_TABLET | Freq: Every day | ORAL | 0 refills | Status: DC

## 2020-07-07 MED ORDER — FINASTERIDE 5 MG PO TABS: 5.0000 mg | ORAL_TABLET | Freq: Every day | ORAL | 2 refills | Status: DC

## 2020-07-07 NOTE — Progress Notes (Addendum)
Virtual Visit via Telephone Note   This visit type was conducted due to national recommendations for restrictions regarding the COVID-19 Pandemic (e.g. social distancing) in an effort to limit this patient's exposure and mitigate transmission in our community.  Due to his co-morbid illnesses, this patient is at least at moderate risk for complications without adequate follow up.  This format is felt to be most appropriate for this patient at this time.  The patient did not have access to video technology/had technical difficulties with video requiring transitioning to audio format only (telephone).  All issues noted in this document were discussed and addressed.  Evaluation Performed:  Follow-up visit  Date:  07/07/2020   ID:  Chad Kirk, DOB 15-Jul-1961, MRN [id number]  Patient Location: Home Provider Location: Office/Clinic  Participants: Patient Location of Patient: Home Location of Provider: Telehealth Consent was obtain for visit to be over via telehealth. I verified that I am speaking with the correct person using two identifiers.  PCP:  [name], MD   Chief Complaint:  Erectile dysfunction  History of Present Illness:    Chad Kirk is a 59 y.o. male with PMH of uncontrolled diabetes mellitus type 2, PVD, hypertension, hyperlipidemia, diabetic macular edema, BPH and alcohol abuse has a televisit for c/o erectile dysfunction for last few months since starting Amlodipine.  He denies any penile discharge, dysuria, hematuria. He takes Tamsulosin and Finasteride for BPH and requests refills for it. Patient also asks for Tadalafil. Patient is advised about side effects of Amlodipine and Finasteride and we will try a different medication for blood pressure for now.  The patient does not have symptoms concerning for COVID-19 infection (fever, chills, cough, or new shortness of breath).   Past Medical, Surgical, Social History, Allergies, and Medications have been  Reviewed.  Past Medical History:  Diagnosis Date  . Diabetes mellitus without complication ([place])    DIAGNOSED AGE 70  . Hypertension    Past Surgical History:  Procedure Laterality Date  . ESOPHAGEAL DILATION N/A 12/07/2019   Procedure: ESOPHAGEAL OR PYLORIC DILATION;  Surgeon: Danie Binder, MD;  Location[facility];  Service: Endoscopy;  Laterality: N/A;  . ESOPHAGOGASTRODUODENOSCOPY N/A 12/07/2019   erosive gastritis, many non-bleeding cratered and superficial duodenal ulcers without stigmata of bleeding in duodenal bulb. Empiric dilation due to possible occult esophageal web. Negative H.pylori.   . EYE SURGERY  03/02/2020   diabetic retinopathy, lens placement, cataract removal, right eye  . HERNIA REPAIR Left 1990     Current Meds  Medication Sig  . amLODipine (NORVASC) 10 MG tablet Take 1 tablet (10 mg total) by mouth daily.  . Blood Glucose Monitoring Suppl (ACCU-CHEK GUIDE ME) w/Device KIT 1 Piece by Does not apply route as directed.  . brimonidine (ALPHAGAN) 0.2 % ophthalmic solution Place 1 drop into the left eye 3 (three) times daily.  . Carboxymethylcellulose Sodium (LUBRICANT EYE DROPS OP) Apply 1 Container to eye as needed (dry eye). Left eye  . Continuous Blood Gluc Receiver (FREESTYLE LIBRE 2 READER) DEVI As directed  . Continuous Blood Gluc Sensor (FREESTYLE LIBRE 2 SENSOR) MISC 1 Piece by Does not apply route every 14 (fourteen) days.  . dorzolamide (TRUSOPT) 2 % ophthalmic solution Place 1 drop into the left eye 3 (three) times daily.  . finasteride (PROSCAR) 5 MG tablet Take 1 tablet (5 mg total) by mouth daily.  Marland Kitchen glipiZIDE (GLUCOTROL XL) 5 MG 24 hr tablet Take 1 tablet (5 mg total) by  mouth daily with breakfast.  . glucose blood (ACCU-CHEK GUIDE) test strip Use as instructed  . insulin glargine (LANTUS) 100 UNIT/ML injection Inject 0.24 mLs (24 Units total) into the skin at bedtime.  . Insulin Pen Needle (B-D ULTRAFINE III SHORT PEN) 31G X 8 MM MISC 1 each by  Does not apply route as directed.  . Multiple Vitamins-Minerals (QC DAILY MULTIVIT/MULTIMINERAL PO) Take 1 tablet by mouth daily.  . Probiotic Product (PROBIOTIC PO) Take 1 tablet by mouth daily.  . rosuvastatin (CRESTOR) 10 MG tablet Take 1 tablet (10 mg total) by mouth daily.  Marland Kitchen senna-docusate (SENOKOT-S) 8.6-50 MG tablet Take 1 tablet by mouth as needed.   . tamsulosin (FLOMAX) 0.4 MG CAPS capsule Take 1 capsule (0.4 mg total) by mouth daily.     Allergies:   Patient has no known allergies.   ROS:   Please see the history of present illness.    ROS Review of Systems  Constitutional: Negative for chills and fever.  HENT: Negative for congestion and sore throat.   Eyes: Positive for visual disturbance. Negative for pain and discharge.  Respiratory: Negative for cough and shortness of breath.   Cardiovascular: Negative for chest pain and palpitations.  Gastrointestinal: Negative for constipation, diarrhea, nausea and vomiting.  Endocrine: Negative for polydipsia and polyuria.  Genitourinary: Negative for dysuria and hematuria. Positive for erectile dysfunction. Musculoskeletal: Negative for neck pain and neck stiffness.  Skin: Negative for rash.  Neurological: Negative for dizziness, weakness, numbness and headaches.  Psychiatric/Behavioral: Negative for agitation and behavioral problems.    Labs/Other Tests and Data Reviewed:    Recent Labs: 12/08/2019: Hemoglobin 13.0; Platelets 193 12/30/2019: ALT 22; BUN 21; Creatinine, Ser 1.11; Potassium 4.6; Sodium 138   Recent Lipid Panel Lab Results  Component Value Date/Time   CHOL 212 (H) 12/30/2019 12:18 PM   TRIG 74 12/30/2019 12:18 PM   HDL 48 12/30/2019 12:18 PM   CHOLHDL 4.4 12/30/2019 12:18 PM   LDLCALC 149 (H) 12/30/2019 12:18 PM    Wt Readings from Last 3 Encounters:  06/30/20 175 lb 6.4 oz (79.6 kg)  06/28/20 177 lb (80.3 kg)  05/19/20 174 lb (78.9 kg)      ASSESSMENT & PLAN:    Erectile dysfunction Since  starting Amlodipine, less common side effect Could be due to Finasteride or uncontrolled DM Will change Amlodipine to Losartan for now If persistent erectile dysfunction, will check free testosterone and/or trial of Cialis  BPH Refilled Tamsulosin and Finasteride  Hypertension Stable BP during office visit Changed Amlodipine to Losartan due to possible side effect with Amlodipine Losartan better in the setting of DM Check CMP in the next visit  Time:   Today, I have spent 22 minutes with the patient with telehealth technology discussing the above problems.   Medication Adjustments/Labs and Tests Ordered: Current medicines are reviewed at length with the patient today.  Concerns regarding medicines are outlined above.   Tests Ordered: No orders of the defined types were placed in this encounter.   Medication Changes: No orders of the defined types were placed in this encounter.    Note: This dictation was prepared with Dragon dictation along with smaller phrase technology. Similar sounding words can be transcribed inadequately or may not be corrected upon review. Any transcriptional errors that result from this process are unintentional.      Disposition:  Follow up  Signed, [name], MD  07/07/2020 8:35 AM     [facility]

## 2020-07-07 NOTE — Patient Instructions (Signed)
Please stop taking Amlodipine and start taking Losartan for hypertension.  We will check blood tests in the next visit to see any specific reason for erectile dysfunction.

## 2020-07-08 ENCOUNTER — Encounter: Payer: Self-pay | Admitting: Podiatry

## 2020-07-08 NOTE — Progress Notes (Signed)
  Subjective:  Patient ID: Chad Kirk, male    DOB: 03-08-1961,  MRN: [id number]  Chief Complaint  Patient presents with  . diabetic foot care    diabetic foot and nail care   59 y.o. male returns for the above complaint.  Patient presents with thickened elongated dystrophic toenails x10.  Patient states is painful to touch.  He would like to have them debrided down his not able to take care of it himself.  He is a diabetic with last A1c of 10.  Objective:  There were no vitals filed for this visit. Podiatric Exam: Vascular: dorsalis pedis and posterior tibial pulses are palpable bilateral. Capillary return is immediate. Temperature gradient is WNL. Skin turgor WNL  Sensorium: Normal Semmes Weinstein monofilament test. Normal tactile sensation bilaterally. Nail Exam: Pt has thick disfigured discolored nails with subungual debris noted bilateral entire nail hallux through fifth toenails.  Pain on palpation to the nails. Ulcer Exam: There is no evidence of ulcer or pre-ulcerative changes or infection. Orthopedic Exam: Muscle tone and strength are WNL. No limitations in general ROM. No crepitus or effusions noted. HAV  B/L.  Hammer toes 2-5  B/L. Skin: No Porokeratosis. No infection or ulcers    Assessment & Plan:   1. Pain due to onychomycosis of toenails of both feet   2. Type 2 diabetes mellitus with diabetic polyneuropathy, with long-term current use of insulin (HCC)     Patient was evaluated and treated and all questions answered.  Onychomycosis with pain  -Nails palliatively debrided as below. -Educated on self-care  Procedure: Nail Debridement Rationale: pain  Type of Debridement: manual, sharp debridement. Instrumentation: Nail nipper, rotary burr. Number of Nails: 10  Procedures and Treatment: Consent by patient was obtained for treatment procedures. The patient understood the discussion of treatment and procedures well. All questions were answered thoroughly reviewed.  Debridement of mycotic and hypertrophic toenails, 1 through 5 bilateral and clearing of subungual debris. No ulceration, no infection noted.  Return Visit-Office Procedure: Patient instructed to return to the office for a follow up visit 3 months for continued evaluation and treatment.  Nicholes Rough, DPM    No follow-ups on file.

## 2020-07-22 ENCOUNTER — Ambulatory Visit: Payer: 59 | Admitting: Family Medicine

## 2020-08-18 ENCOUNTER — Ambulatory Visit: Payer: 59 | Admitting: "Endocrinology

## 2020-08-23 ENCOUNTER — Ambulatory Visit: Payer: 59 | Admitting: Nutrition

## 2020-09-08 ENCOUNTER — Other Ambulatory Visit: Payer: Self-pay

## 2020-09-08 ENCOUNTER — Telehealth (INDEPENDENT_AMBULATORY_CARE_PROVIDER_SITE_OTHER): Payer: 59 | Admitting: Nurse Practitioner

## 2020-09-08 ENCOUNTER — Encounter: Payer: Self-pay | Admitting: Nurse Practitioner

## 2020-09-08 DIAGNOSIS — J069 Acute upper respiratory infection, unspecified: Secondary | ICD-10-CM

## 2020-09-08 MED ORDER — NOREL AD 4-10-325 MG PO TABS: 1.0000 | ORAL_TABLET | ORAL | 1 refills | Status: DC | PRN

## 2020-09-08 MED ORDER — AMOXICILLIN-POT CLAVULANATE 875-125 MG PO TABS: 1.0000 | ORAL_TABLET | Freq: Two times a day (BID) | ORAL | 0 refills | Status: DC

## 2020-09-08 MED ORDER — HYDROCODONE-HOMATROPINE 5-1.5 MG/5ML PO SYRP: 5.0000 mL | ORAL_SOLUTION | Freq: Three times a day (TID) | ORAL | 0 refills | Status: DC | PRN

## 2020-09-08 NOTE — Progress Notes (Signed)
Acute Office Visit  Subjective:    Patient ID: Chad Kirk, male    DOB: 1961/02/22, 60 y.o.   MRN: [id number]  Chief Complaint  Patient presents with  . Cough    Chest congestion x 2 weeks     HPI Patient is in today for cough and chest congestion x 2 weeks. He has pending COVID test. He has night sweats at times.  Past Medical History:  Diagnosis Date  . Diabetes mellitus without complication ([place])    DIAGNOSED AGE 73  . Hypertension     Past Surgical History:  Procedure Laterality Date  . ESOPHAGEAL DILATION N/A 12/07/2019   Procedure: ESOPHAGEAL OR PYLORIC DILATION;  Surgeon: Danie Binder, MD;  [facility];  Service: Endoscopy;  Laterality: N/A;  . ESOPHAGOGASTRODUODENOSCOPY N/A 12/07/2019   erosive gastritis, many non-bleeding cratered and superficial duodenal ulcers without stigmata of bleeding in duodenal bulb. Empiric dilation due to possible occult esophageal web. Negative H.pylori.   . EYE SURGERY  03/02/2020   diabetic retinopathy, lens placement, cataract removal, right eye  . HERNIA REPAIR Left 1990    Family History  Problem Relation Age of Onset  . Stroke Mother   . Hypertension Mother   . Diabetes Mother   . Hypertension Father   . Diabetes Father   . Colon cancer Neg Hx   . Colon polyps Neg Hx     Social History   Socioeconomic History  . Marital status: Married    Spouse name: Not on file  . Number of children: Not on file  . Years of education: Not on file  . Highest education level: Not on file  Occupational History  . Not on file  Tobacco Use  . Smoking status: Former Smoker    Packs/day: 1.00    Years: 5.00    Pack years: 5.00    Quit date: 08/20/1988    Years since quitting: 32.0  . Smokeless tobacco: Never Used  Vaping Use  . Vaping Use: Never used  Substance and Sexual Activity  . Alcohol use: Not Currently    Comment: denied 03/15/20  . Drug use: No  . Sexual activity: Not on file  Other Topics Concern  . Not  on file  Social History Narrative  . Not on file   Social Determinants of Health   Financial Resource Strain: Not on file  Food Insecurity: Not on file  Transportation Needs: Not on file  Physical Activity: Not on file  Stress: Not on file  Social Connections: Not on file  Intimate Partner Violence: Not on file    Outpatient Medications Prior to Visit  Medication Sig Dispense Refill  . Blood Glucose Monitoring Suppl (ACCU-CHEK GUIDE ME) w/Device KIT 1 Piece by Does not apply route as directed. 1 kit 0  . brimonidine (ALPHAGAN) 0.2 % ophthalmic solution Place 1 drop into the left eye 3 (three) times daily.    . Carboxymethylcellulose Sodium (LUBRICANT EYE DROPS OP) Apply 1 Container to eye as needed (dry eye). Left eye    . Continuous Blood Gluc Receiver (FREESTYLE LIBRE 2 READER) DEVI As directed 1 each 0  . Continuous Blood Gluc Sensor (FREESTYLE LIBRE 2 SENSOR) MISC 1 Piece by Does not apply route every 14 (fourteen) days. 2 each 3  . dorzolamide (TRUSOPT) 2 % ophthalmic solution Place 1 drop into the left eye 3 (three) times daily.    . finasteride (PROSCAR) 5 MG tablet Take 1 tablet (5 mg total) by  mouth daily. 30 tablet 2  . glipiZIDE (GLUCOTROL XL) 5 MG 24 hr tablet Take 1 tablet (5 mg total) by mouth daily with breakfast. 90 tablet 1  . glucose blood (ACCU-CHEK GUIDE) test strip Use as instructed 150 each 2  . insulin glargine (LANTUS) 100 UNIT/ML injection Inject 0.24 mLs (24 Units total) into the skin at bedtime. 15 mL 1  . Insulin Pen Needle (B-D ULTRAFINE III SHORT PEN) 31G X 8 MM MISC 1 each by Does not apply route as directed. 100 each 3  . losartan (COZAAR) 50 MG tablet Take 1 tablet (50 mg total) by mouth daily. 90 tablet 0  . Multiple Vitamins-Minerals (QC DAILY MULTIVIT/MULTIMINERAL PO) Take 1 tablet by mouth daily.    . Probiotic Product (PROBIOTIC PO) Take 1 tablet by mouth daily.    . rosuvastatin (CRESTOR) 10 MG tablet Take 1 tablet (10 mg total) by mouth daily. 90  tablet 1  . senna-docusate (SENOKOT-S) 8.6-50 MG tablet Take 1 tablet by mouth as needed.     . tamsulosin (FLOMAX) 0.4 MG CAPS capsule Take 1 capsule (0.4 mg total) by mouth daily. 30 capsule 3   No facility-administered medications prior to visit.    No Known Allergies  Review of Systems  Constitutional: Negative for chills, fatigue and fever.  HENT: Positive for congestion, rhinorrhea and sneezing. Negative for ear pain, postnasal drip, sinus pressure, sinus pain and sore throat.   Respiratory: Positive for cough. Negative for shortness of breath and wheezing.   Cardiovascular: Negative.   Gastrointestinal: Negative for constipation, diarrhea, rectal pain and vomiting.       Objective:    Physical Exam  There were no vitals taken for this visit. Wt Readings from Last 3 Encounters:  06/30/20 175 lb 6.4 oz (79.6 kg)  06/28/20 177 lb (80.3 kg)  05/19/20 174 lb (78.9 kg)    Health Maintenance Due  Topic Date Due  . Hepatitis C Screening  Never done  . OPHTHALMOLOGY EXAM  Never done  . COLONOSCOPY (Pts 45-104yr Insurance coverage will need to be confirmed)  Never done  . COVID-19 Vaccine (3 - Booster for Pfizer series) 05/09/2020    There are no preventive care reminders to display for this patient.   No results found for: TSH Lab Results  Component Value Date   WBC 5.2 12/08/2019   HGB 13.0 12/08/2019   HCT 41.3 12/08/2019   MCV 86.2 12/08/2019   PLT 193 12/08/2019   Lab Results  Component Value Date   NA 138 12/30/2019   K 4.6 12/30/2019   CO2 27 12/30/2019   GLUCOSE 256 (H) 12/30/2019   BUN 21 (H) 12/30/2019   CREATININE 1.11 12/30/2019   BILITOT 0.6 12/30/2019   ALKPHOS 65 12/30/2019   AST 16 12/30/2019   ALT 22 12/30/2019   PROT 7.8 12/30/2019   ALBUMIN 3.7 12/30/2019   CALCIUM 9.4 12/30/2019   ANIONGAP 8 12/30/2019   Lab Results  Component Value Date   CHOL 212 (H) 12/30/2019   Lab Results  Component Value Date   HDL 48 12/30/2019   Lab  Results  Component Value Date   LDLCALC 149 (H) 12/30/2019   Lab Results  Component Value Date   TRIG 74 12/30/2019   Lab Results  Component Value Date   CHOLHDL 4.4 12/30/2019   Lab Results  Component Value Date   HGBA1C 10.0 (A) 05/05/2020       Assessment & Plan:   Problem List Items Addressed  This Visit      Respiratory   URI (upper respiratory infection)    -ongoing x 2 weeks -Rx. augmentin -Rx. Hycodan syrup -Rx. norel           Meds ordered this encounter  Medications  . Chlorphen-PE-Acetaminophen (NOREL AD) 4-10-325 MG TABS    Sig: Take 1 tablet by mouth every 4 (four) hours as needed (nasal congestion, cold symptoms).    Dispense:  20 tablet    Refill:  1  . amoxicillin-clavulanate (AUGMENTIN) 875-125 MG tablet    Sig: Take 1 tablet by mouth 2 (two) times daily.    Dispense:  20 tablet    Refill:  0  . HYDROcodone-homatropine (HYCODAN) 5-1.5 MG/5ML syrup    Sig: Take 5 mLs by mouth every 8 (eight) hours as needed for cough.    Dispense:  120 mL    Refill:  0   Date:  09/08/2020   Location of Patient: Home Location of Provider: Office Consent was obtain for visit to be over via telehealth. I verified that I am speaking with the correct person using two identifiers.  I connected with  Bradd Canary on 09/08/20 via telephone and verified that I am speaking with the correct person using two identifiers.   I discussed the limitations of evaluation and management by telemedicine. The patient expressed understanding and agreed to proceed.  Time spent: 8 minutes   Noreene Larsson, NP

## 2020-09-08 NOTE — Assessment & Plan Note (Signed)
-  ongoing x 2 weeks -Rx. augmentin -Rx. Hycodan syrup -Rx. norel

## 2020-09-12 ENCOUNTER — Telehealth: Payer: Self-pay

## 2020-09-12 ENCOUNTER — Other Ambulatory Visit: Payer: Self-pay | Admitting: Internal Medicine

## 2020-09-12 DIAGNOSIS — I10 Essential (primary) hypertension: Secondary | ICD-10-CM

## 2020-09-12 NOTE — Telephone Encounter (Signed)
Okay to send it there. Thank you.

## 2020-09-12 NOTE — Telephone Encounter (Signed)
Pt says you sent him in cough syrup a few days ago to [facility] in [place]. They are out of stock. [facility] on [address] In [place] does have this in stock. He wanted to know if you could send this in here?

## 2020-09-13 ENCOUNTER — Other Ambulatory Visit: Payer: Self-pay | Admitting: Nurse Practitioner

## 2020-09-13 ENCOUNTER — Other Ambulatory Visit: Payer: Self-pay

## 2020-09-13 DIAGNOSIS — J069 Acute upper respiratory infection, unspecified: Secondary | ICD-10-CM

## 2020-09-13 MED ORDER — UNABLE TO FIND: 5.0000 mL | 0 refills | Status: AC | PRN

## 2020-09-13 NOTE — Telephone Encounter (Signed)
Please advise 

## 2020-09-13 NOTE — Telephone Encounter (Signed)
Pt informed

## 2020-09-13 NOTE — Telephone Encounter (Signed)
It will not let me send, due to being a narcotic.

## 2020-09-13 NOTE — Telephone Encounter (Signed)
I sent in apothecary cough syrup to [facility] to save him the drive to [employer]. It has similar ingredients.

## 2020-09-14 ENCOUNTER — Other Ambulatory Visit: Payer: Self-pay | Admitting: Internal Medicine

## 2020-09-14 DIAGNOSIS — I10 Essential (primary) hypertension: Secondary | ICD-10-CM

## 2020-09-15 ENCOUNTER — Telehealth: Payer: Self-pay | Admitting: *Deleted

## 2020-09-15 ENCOUNTER — Ambulatory Visit (INDEPENDENT_AMBULATORY_CARE_PROVIDER_SITE_OTHER): Payer: 59 | Admitting: Gastroenterology

## 2020-09-15 ENCOUNTER — Encounter: Payer: Self-pay | Admitting: Gastroenterology

## 2020-09-15 DIAGNOSIS — K269 Duodenal ulcer, unspecified as acute or chronic, without hemorrhage or perforation: Secondary | ICD-10-CM

## 2020-09-15 NOTE — Patient Instructions (Signed)
If you have to take prednisone again, please call us so we can start you on a medication to help protect your stomach.  Please call if any bright red blood, black tarry stool, or abdominal pain.   Please call us when you are ready to pursue a colonoscopy!  We will see you back as needed.   I enjoyed talking with you again today! As you know, I value our relationship and want to provide genuine, compassionate, and quality care. I welcome your feedback. If you receive a survey regarding your visit,  I greatly appreciate you taking time to fill this out. See you next time!  Gelene Mink, PhD, ANP-BC [facility]

## 2020-09-15 NOTE — Telephone Encounter (Signed)
Pt consented to a telephone visit. °

## 2020-09-15 NOTE — Telephone Encounter (Signed)
Chad Kirk, you are scheduled for a virtual visit with your provider today.  Just as we do with appointments in the office, we must obtain your consent to participate.  Your consent will be active for this visit and any virtual visit you may have with one of our providers in the next 365 days.  If you have a MyChart account, I can also send a copy of this consent to you electronically.  All virtual visits are billed to your insurance company just like a traditional visit in the office.  As this is a virtual visit, video technology does not allow for your provider to perform a traditional examination.  This may limit your provider's ability to fully assess your condition.  If your provider identifies any concerns that need to be evaluated in person or the need to arrange testing such as labs, EKG, etc, we will make arrangements to do so.  Although advances in technology are sophisticated, we cannot ensure that it will always work on either your end or our end.  If the connection with a video visit is poor, we may have to switch to a telephone visit.  With either a video or telephone visit, we are not always able to ensure that we have a secure connection.   I need to obtain your verbal consent now.   Are you willing to proceed with your visit today?

## 2020-09-15 NOTE — Progress Notes (Signed)
Primary Care Physician:  [name], MD  Primary GI: Dr. Abbey Chatters, previously Dr. Oneida Alar   Patient Location: Home   Provider Location: [facility] office   Reason for Visit: Follow-up    Persons present on the virtual encounter, with roles: Patient and wife   Total time (minutes) spent on medical discussion: 12 minutes   Due to COVID-19, visit was conducted using virtual method.  Visit was requested by patient.  Virtual Visit via Telephone Note Due to COVID-19, visit is conducted virtually and was requested by patient.   I connected with Chad Kirk on 09/15/20 at  9:30 AM EST by telephone and verified that I am speaking with the correct person using two identifiers.   I discussed the limitations, risks, security and privacy concerns of performing an evaluation and management service by telephone and the availability of in person appointments. I also discussed with the patient that there may be a patient responsible charge related to this service. The patient expressed understanding and agreed to proceed.  Chief Complaint  Patient presents with  . Follow-up    No complaints     History of Present Illness: Pleasant 60 year old male with history of hospitalization April 2021 for abdominal pain, N/V. He underwent EGD during admission with  erosive gastritis, many non-bleeding cratered and superficial duodenal ulcers without stigmata of bleeding in duodenal bulb. Empiric dilation due to possible occult esophageal web. Negative H.pylori. Felt to be NSAID effect. Had been on Ibuprofen and aspirin. Recommending indefinite PPI while on aspirin. Last colonoscopy over 10 years ago and had wanted to hold off on this at last visit. No NSAIDs. Tylenol products only. Wants to wait on colonoscopy until eyesight under better control.   No surveillance EGD undertaken as was duodenal ulcer and obvious source. He is off PPI now and continues to abstain from NSAIDs. No abdominal pain, N/V. No overt GI  bleeding. No dysphagia. No constipation or diarrhea. He is wanting to hold off on colonoscopy until vision is better. He has been seeing ophthalmology frequently due to history of retinal detachment, diabetic retinopathy.   Past Medical History:  Diagnosis Date  . Diabetes mellitus without complication ([place])    DIAGNOSED AGE 19  . Hypertension      Past Surgical History:  Procedure Laterality Date  . ESOPHAGEAL DILATION N/A 12/07/2019   Procedure: ESOPHAGEAL OR PYLORIC DILATION;  Surgeon: Danie Binder, MD;  Location: [facility];  Service: Endoscopy;  Laterality: N/A;  . ESOPHAGOGASTRODUODENOSCOPY N/A 12/07/2019   erosive gastritis, many non-bleeding cratered and superficial duodenal ulcers without stigmata of bleeding in duodenal bulb. Empiric dilation due to possible occult esophageal web. Negative H.pylori.   . EYE SURGERY  03/02/2020   diabetic retinopathy, lens placement, cataract removal, right eye  . HERNIA REPAIR Left 1990     Current Meds  Medication Sig  . amoxicillin-clavulanate (AUGMENTIN) 875-125 MG tablet Take 1 tablet by mouth 2 (two) times daily.  . Blood Glucose Monitoring Suppl (ACCU-CHEK GUIDE ME) w/Device KIT 1 Piece by Does not apply route as directed.  . brimonidine (ALPHAGAN) 0.2 % ophthalmic solution Place 1 drop into the left eye 3 (three) times daily.  . Carboxymethylcellulose Sodium (LUBRICANT EYE DROPS OP) Apply 1 Container to eye as needed (dry eye). Left eye  . Chlorphen-PE-Acetaminophen (NOREL AD) 4-10-325 MG TABS Take 1 tablet by mouth every 4 (four) hours as needed (nasal congestion, cold symptoms).  . Continuous Blood Gluc Receiver (FREESTYLE LIBRE 2 READER) DEVI As  directed  . Continuous Blood Gluc Sensor (FREESTYLE LIBRE 2 SENSOR) MISC 1 Piece by Does not apply route every 14 (fourteen) days.  . dorzolamide (TRUSOPT) 2 % ophthalmic solution Place 1 drop into the left eye 3 (three) times daily.  . finasteride (PROSCAR) 5 MG tablet Take 1 tablet (5  mg total) by mouth daily.  Marland Kitchen glipiZIDE (GLUCOTROL XL) 5 MG 24 hr tablet Take 1 tablet (5 mg total) by mouth daily with breakfast.  . glucose blood (ACCU-CHEK GUIDE) test strip Use as instructed  . insulin glargine (LANTUS) 100 UNIT/ML injection Inject 0.24 mLs (24 Units total) into the skin at bedtime.  . Insulin Pen Needle (B-D ULTRAFINE III SHORT PEN) 31G X 8 MM MISC 1 each by Does not apply route as directed.  Marland Kitchen losartan (COZAAR) 50 MG tablet TAKE 1 TABLET BY MOUTH  DAILY  . Multiple Vitamins-Minerals (QC DAILY MULTIVIT/MULTIMINERAL PO) Take 1 tablet by mouth daily.  . Probiotic Product (PROBIOTIC PO) Take 1 tablet by mouth daily.  . rosuvastatin (CRESTOR) 10 MG tablet Take 1 tablet (10 mg total) by mouth daily.  Marland Kitchen senna-docusate (SENOKOT-S) 8.6-50 MG tablet Take 1 tablet by mouth as needed.   . tamsulosin (FLOMAX) 0.4 MG CAPS capsule Take 1 capsule (0.4 mg total) by mouth daily.  Marland Kitchen UNABLE TO FIND Take 5 mLs by mouth every 4 (four) hours as needed. Med Name: Apothecary Cough Syrup Take 5 mL by mouth every 4 to 6 hours as needed for cough     Family History  Problem Relation Age of Onset  . Stroke Mother   . Hypertension Mother   . Diabetes Mother   . Hypertension Father   . Diabetes Father   . Colon cancer Neg Hx   . Colon polyps Neg Hx     Social History   Socioeconomic History  . Marital status: Married    Spouse name: Not on file  . Number of children: Not on file  . Years of education: Not on file  . Highest education level: Not on file  Occupational History  . Not on file  Tobacco Use  . Smoking status: Former Smoker    Packs/day: 1.00    Years: 5.00    Pack years: 5.00    Quit date: 08/20/1988    Years since quitting: 32.0  . Smokeless tobacco: Never Used  Vaping Use  . Vaping Use: Never used  Substance and Sexual Activity  . Alcohol use: Not Currently    Comment: denied 03/15/20  . Drug use: No  . Sexual activity: Not on file  Other Topics Concern  . Not on  file  Social History Narrative  . Not on file   Social Determinants of Health   Financial Resource Strain: Not on file  Food Insecurity: Not on file  Transportation Needs: Not on file  Physical Activity: Not on file  Stress: Not on file  Social Connections: Not on file       Review of Systems: Gen: Denies fever, chills, anorexia. Denies fatigue, weakness, weight loss.  CV: Denies chest pain, palpitations, syncope, peripheral edema, and claudication. Resp: Denies dyspnea at rest, cough, wheezing, coughing up blood, and pleurisy. GI: see HPI Derm: Denies rash, itching, dry skin Psych: Denies depression, anxiety, memory loss, confusion. No homicidal or suicidal ideation.  Heme: Denies bruising, bleeding, and enlarged lymph nodes.  Observations/Objective: No distress. Unable to perform physical exam due to telephone encounter. No video available.   Assessment and Plan: Pleasant  60 year old male with history of PUD (duodenal) due to NSAIDs, doing well at this time without any GI complaints. We discussed EGD if any recurrent abdominal pain, overt bleeding, dysphagia, weight loss, etc. He is to avoid all NSAIDs going forward. I do note he had a short course of prednisone briefly. We discussed if he were to take this again, he needs to be on a PPI as well.   Colonoscopy due, but in setting of ophthalmic issues, he is holding off. He will call us when ready to proceed.   Follow Up Instructions:    I discussed the assessment and treatment plan with the patient. The patient was provided an opportunity to ask questions and all were answered. The patient agreed with the plan and demonstrated an understanding of the instructions.   The patient was advised to call back or seek an in-person evaluation if the symptoms worsen or if the condition fails to improve as anticipated.  I provided 12 minutes of non face-to-face time during this MyChart telephone encounter.  Annitta Needs, PhD,  ANP-BC [facility]

## 2020-09-26 ENCOUNTER — Other Ambulatory Visit: Payer: Self-pay | Admitting: "Endocrinology

## 2020-10-04 ENCOUNTER — Other Ambulatory Visit: Payer: Self-pay | Admitting: "Endocrinology

## 2020-10-06 ENCOUNTER — Telehealth: Payer: Self-pay

## 2020-10-06 NOTE — Telephone Encounter (Signed)
Not on pt med list will need a virtual visit

## 2020-10-06 NOTE — Telephone Encounter (Signed)
Patient requesting Viagra for erectile dysfunction p# [phone]

## 2020-10-06 NOTE — Telephone Encounter (Signed)
Pt scheduled for phone visit 10/10/20 @ 11:00 pt aware

## 2020-10-10 ENCOUNTER — Encounter: Payer: Self-pay | Admitting: Internal Medicine

## 2020-10-10 ENCOUNTER — Telehealth (INDEPENDENT_AMBULATORY_CARE_PROVIDER_SITE_OTHER): Payer: 59 | Admitting: Internal Medicine

## 2020-10-10 ENCOUNTER — Other Ambulatory Visit: Payer: Self-pay

## 2020-10-10 DIAGNOSIS — Z794 Long term (current) use of insulin: Secondary | ICD-10-CM | POA: Diagnosis not present

## 2020-10-10 DIAGNOSIS — N529 Male erectile dysfunction, unspecified: Secondary | ICD-10-CM

## 2020-10-10 DIAGNOSIS — N400 Enlarged prostate without lower urinary tract symptoms: Secondary | ICD-10-CM

## 2020-10-10 DIAGNOSIS — E1139 Type 2 diabetes mellitus with other diabetic ophthalmic complication: Secondary | ICD-10-CM | POA: Diagnosis not present

## 2020-10-10 MED ORDER — SILDENAFIL CITRATE 50 MG PO TABS: 50.0000 mg | ORAL_TABLET | Freq: Every day | ORAL | 0 refills | Status: DC | PRN

## 2020-10-10 NOTE — Patient Instructions (Signed)
Please start taking Sildenafil as prescribed for erectile dysfunction. Please contact us if you have any dizziness or headache.  Please contact Endocrinology office to schedule appointment.

## 2020-10-10 NOTE — Assessment & Plan Note (Signed)
On Lantus 24 U qHS and Glipizide 5 mg QD Advised to follow diabetic diet On statin F/u CMP and lipid panel in the next visit Diabetic eye exam: Advised to continue to follow up with Ophthalmology for diabetic eye exam

## 2020-10-10 NOTE — Progress Notes (Signed)
Virtual Visit via Telephone Note   This visit type was conducted due to national recommendations for restrictions regarding the COVID-19 Pandemic (e.g. social distancing) in an effort to limit this patient's exposure and mitigate transmission in our community.  Due to his co-morbid illnesses, this patient is at least at moderate risk for complications without adequate follow up.  This format is felt to be most appropriate for this patient at this time.  The patient did not have access to video technology/had technical difficulties with video requiring transitioning to audio format only (telephone).  All issues noted in this document were discussed and addressed.  No physical exam could be performed with this format.   Evaluation Performed:  Follow-up visit  Date:  10/10/2020   ID:  Chad Kirk, DOB 1961-03-15, MRN [id number]  Patient Location: Home Provider Location: Office/Clinic  Participants: Patient Location of Patient: Home Location of Provider: Telehealth Consent was obtain for visit to be over via telehealth. I verified that I am speaking with the correct person using two identifiers.  PCP:  [name], MD   Chief Complaint:  Erectile dysfunction  History of Present Illness:    Chad Kirk is a 60 y.o. male with PMH of uncontrolled diabetes mellitus type 2, PVD, hypertension, hyperlipidemia, diabetic macular edema, BPH and alcohol abusehas a televisit for c/o erectile dysfunction.  He denies having morning erections. Of note, he has a h/o uncontrolled DM and BPH. He is on Finasteride for BPH. He denies any LE numbness or weakness. He denies dysuria or hematuria. Denies any urethral discharge. Denies any recent domestic stress or anxiety.  The patient does not have symptoms concerning for COVID-19 infection (fever, chills, cough, or new shortness of breath).   Past Medical, Surgical, Social History, Allergies, and Medications have been Reviewed.  Past Medical History:   Diagnosis Date  . Diabetes mellitus without complication ([place])    DIAGNOSED AGE 60  . Hypertension    Past Surgical History:  Procedure Laterality Date  . ESOPHAGEAL DILATION N/A 12/07/2019   Procedure: ESOPHAGEAL OR PYLORIC DILATION;  Surgeon: Danie Binder, MD;  Location: [facility];  Service: Endoscopy;  Laterality: N/A;  . ESOPHAGOGASTRODUODENOSCOPY N/A 12/07/2019   erosive gastritis, many non-bleeding cratered and superficial duodenal ulcers without stigmata of bleeding in duodenal bulb. Empiric dilation due to possible occult esophageal web. Negative H.pylori.   . EYE SURGERY  03/02/2020   diabetic retinopathy, lens placement, cataract removal, right eye  . HERNIA REPAIR Left 1990     Current Meds  Medication Sig  . amLODipine (NORVASC) 10 MG tablet Take 10 mg by mouth daily.  . Blood Glucose Monitoring Suppl (ACCU-CHEK GUIDE ME) w/Device KIT 1 Piece by Does not apply route as directed.  . brimonidine (ALPHAGAN) 0.2 % ophthalmic solution Place 1 drop into the left eye 3 (three) times daily.  . Carboxymethylcellulose Sodium (LUBRICANT EYE DROPS OP) Apply 1 Container to eye as needed (dry eye). Left eye  . Continuous Blood Gluc Receiver (FREESTYLE LIBRE 2 READER) DEVI As directed  . Continuous Blood Gluc Sensor (FREESTYLE LIBRE 2 SENSOR) MISC USE ONE SENSOR EVERY 14 DAYS  . dorzolamide (TRUSOPT) 2 % ophthalmic solution Place 1 drop into the left eye 3 (three) times daily.  . finasteride (PROSCAR) 5 MG tablet Take 1 tablet (5 mg total) by mouth daily.  Marland Kitchen glipiZIDE (GLUCOTROL XL) 5 MG 24 hr tablet Take 1 tablet (5 mg total) by mouth daily with breakfast.  . glucose  blood (ACCU-CHEK GUIDE) test strip Use as instructed  . Insulin Pen Needle (B-D ULTRAFINE III SHORT PEN) 31G X 8 MM MISC 1 each by Does not apply route as directed.  Marland Kitchen LANTUS SOLOSTAR 100 UNIT/ML Solostar Pen INJECT 24 UNITS SUBCUTANEOUSLY AT BEDTIME  . losartan (COZAAR) 50 MG tablet TAKE 1 TABLET BY MOUTH  DAILY  .  Multiple Vitamins-Minerals (QC DAILY MULTIVIT/MULTIMINERAL PO) Take 1 tablet by mouth daily.  . Probiotic Product (PROBIOTIC PO) Take 1 tablet by mouth daily.  . rosuvastatin (CRESTOR) 10 MG tablet Take 1 tablet (10 mg total) by mouth daily.  Marland Kitchen senna-docusate (SENOKOT-S) 8.6-50 MG tablet Take 1 tablet by mouth as needed.   . tamsulosin (FLOMAX) 0.4 MG CAPS capsule Take 1 capsule (0.4 mg total) by mouth daily.  Marland Kitchen UNABLE TO FIND Take 5 mLs by mouth every 4 (four) hours as needed. Med Name: Apothecary Cough Syrup Take 5 mL by mouth every 4 to 6 hours as needed for cough     Allergies:   Patient has no known allergies.   ROS:   Please see the history of present illness.     All other systems reviewed and are negative.   Labs/Other Tests and Data Reviewed:    Recent Labs: 12/08/2019: Hemoglobin 13.0; Platelets 193 12/30/2019: ALT 22; BUN 21; Creatinine, Ser 1.11; Potassium 4.6; Sodium 138   Recent Lipid Panel Lab Results  Component Value Date/Time   CHOL 212 (H) 12/30/2019 12:18 PM   TRIG 74 12/30/2019 12:18 PM   HDL 48 12/30/2019 12:18 PM   CHOLHDL 4.4 12/30/2019 12:18 PM   LDLCALC 149 (H) 12/30/2019 12:18 PM    Wt Readings from Last 3 Encounters:  06/30/20 175 lb 6.4 oz (79.6 kg)  06/28/20 177 lb (80.3 kg)  05/19/20 174 lb (78.9 kg)      ASSESSMENT & PLAN:    Erectile dysfunction Likely due to underlying uncontrolled DM and being on Finasteride for BPH Trial of Sildenafil for now Advised to report if he has dizziness or headache  Diabetes mellitus (HCC) On Lantus 24 U qHS and Glipizide 5 mg QD Advised to follow diabetic diet On statin F/u CMP and lipid panel in the next visit Diabetic eye exam: Advised to continue to follow up with Ophthalmology for diabetic eye exam   BPH (benign prostatic hyperplasia) On Flomax and Finasteride    Time:   Today, I have spent 16 minutes reviewing the chart, including problem list, medications, and with the patient with  telehealth technology discussing the above problems.   Medication Adjustments/Labs and Tests Ordered: Current medicines are reviewed at length with the patient today.  Concerns regarding medicines are outlined above.   Tests Ordered: No orders of the defined types were placed in this encounter.   Medication Changes: No orders of the defined types were placed in this encounter.    Note: This dictation was prepared with Dragon dictation along with smaller phrase technology. Similar sounding words can be transcribed inadequately or may not be corrected upon review. Any transcriptional errors that result from this process are unintentional.      Disposition:  Follow up  Signed, [name], MD  10/10/2020 10:55 AM     [place]

## 2020-10-10 NOTE — Assessment & Plan Note (Signed)
On Flomax and Finasteride 

## 2020-10-12 ENCOUNTER — Other Ambulatory Visit: Payer: Self-pay

## 2020-10-12 ENCOUNTER — Encounter: Payer: Self-pay | Admitting: Podiatry

## 2020-10-12 ENCOUNTER — Ambulatory Visit (INDEPENDENT_AMBULATORY_CARE_PROVIDER_SITE_OTHER): Payer: 59 | Admitting: Podiatry

## 2020-10-12 DIAGNOSIS — M2011 Hallux valgus (acquired), right foot: Secondary | ICD-10-CM

## 2020-10-12 DIAGNOSIS — M79675 Pain in left toe(s): Secondary | ICD-10-CM | POA: Diagnosis not present

## 2020-10-12 DIAGNOSIS — E1142 Type 2 diabetes mellitus with diabetic polyneuropathy: Secondary | ICD-10-CM

## 2020-10-12 DIAGNOSIS — M2042 Other hammer toe(s) (acquired), left foot: Secondary | ICD-10-CM

## 2020-10-12 DIAGNOSIS — M79674 Pain in right toe(s): Secondary | ICD-10-CM | POA: Diagnosis not present

## 2020-10-12 DIAGNOSIS — M2041 Other hammer toe(s) (acquired), right foot: Secondary | ICD-10-CM

## 2020-10-12 DIAGNOSIS — B351 Tinea unguium: Secondary | ICD-10-CM | POA: Diagnosis not present

## 2020-10-12 DIAGNOSIS — Z794 Long term (current) use of insulin: Secondary | ICD-10-CM

## 2020-10-12 DIAGNOSIS — M2012 Hallux valgus (acquired), left foot: Secondary | ICD-10-CM

## 2020-10-16 NOTE — Progress Notes (Signed)
Subjective:  Patient ID: Chad Kirk, male    DOB: [birth year],  MRN: [id number]  60 y.o. male presents with at risk foot care with history of diabetic neuropathy and painful thick toenails that are difficult to trim. Pain interferes with ambulation. Aggravating factors include wearing enclosed shoe gear. Pain is relieved with periodic professional debridement..    Patient's blood sugar was 206 mg/dl this morning.  PCP: [name], MD and last visit was: 10/10/2020.  Review of Systems: Negative except as noted in the HPI.    Current Outpatient Medications:  .  moxifloxacin (VIGAMOX) 0.5 % ophthalmic solution, Place 1 drop into the left eye 4 times daily for 7 days., Disp: , Rfl:  .  timolol (TIMOPTIC) 0.5 % ophthalmic solution, Place 1 drop into both eyes 3 times daily., Disp: , Rfl:  .  amLODipine (NORVASC) 10 MG tablet, Take 10 mg by mouth daily., Disp: , Rfl:  .  Blood Glucose Monitoring Suppl (ACCU-CHEK GUIDE ME) w/Device KIT, 1 Piece by Does not apply route as directed., Disp: 1 kit, Rfl: 0 .  brimonidine (ALPHAGAN) 0.2 % ophthalmic solution, Place 1 drop into the left eye 3 (three) times daily., Disp: , Rfl:  .  Carboxymethylcellulose Sodium (LUBRICANT EYE DROPS OP), Apply 1 Container to eye as needed (dry eye). Left eye, Disp: , Rfl:  .  Continuous Blood Gluc Receiver (FREESTYLE LIBRE 2 READER) DEVI, As directed, Disp: 1 each, Rfl: 0 .  Continuous Blood Gluc Sensor (FREESTYLE LIBRE 2 SENSOR) MISC, USE ONE SENSOR EVERY 14 DAYS, Disp: 2 each, Rfl: 0 .  dorzolamide (TRUSOPT) 2 % ophthalmic solution, Place 1 drop into the left eye 3 (three) times daily., Disp: , Rfl:  .  finasteride (PROSCAR) 5 MG tablet, Take 1 tablet (5 mg total) by mouth daily., Disp: 30 tablet, Rfl: 2 .  glipiZIDE (GLUCOTROL XL) 5 MG 24 hr tablet, Take 1 tablet (5 mg total) by mouth daily with breakfast., Disp: 90 tablet, Rfl: 1 .  glucose blood (ACCU-CHEK GUIDE) test strip, Use as instructed, Disp: 150 each, Rfl:  2 .  Insulin Pen Needle (B-D ULTRAFINE III SHORT PEN) 31G X 8 MM MISC, 1 each by Does not apply route as directed., Disp: 100 each, Rfl: 3 .  LANTUS SOLOSTAR 100 UNIT/ML Solostar Pen, INJECT 24 UNITS SUBCUTANEOUSLY AT BEDTIME, Disp: 15 mL, Rfl: 0 .  losartan (COZAAR) 50 MG tablet, TAKE 1 TABLET BY MOUTH  DAILY, Disp: 90 tablet, Rfl: 3 .  moxifloxacin (VIGAMOX) 0.5 % ophthalmic solution, , Disp: , Rfl:  .  Multiple Vitamins-Minerals (QC DAILY MULTIVIT/MULTIMINERAL PO), Take 1 tablet by mouth daily., Disp: , Rfl:  .  Probiotic Product (PROBIOTIC PO), Take 1 tablet by mouth daily., Disp: , Rfl:  .  rosuvastatin (CRESTOR) 10 MG tablet, Take 1 tablet (10 mg total) by mouth daily., Disp: 90 tablet, Rfl: 1 .  senna-docusate (SENOKOT-S) 8.6-50 MG tablet, Take 1 tablet by mouth as needed. , Disp: , Rfl:  .  sildenafil (VIAGRA) 50 MG tablet, Take 1 tablet (50 mg total) by mouth daily as needed for erectile dysfunction., Disp: 10 tablet, Rfl: 0 .  tamsulosin (FLOMAX) 0.4 MG CAPS capsule, Take 1 capsule (0.4 mg total) by mouth daily., Disp: 30 capsule, Rfl: 3 .  UNABLE TO FIND, Take 5 mLs by mouth every 4 (four) hours as needed. Med Name: Apothecary Cough Syrup Take 5 mL by mouth every 4 to 6 hours as needed for cough, Disp: 120 mL, Rfl: 0  No Known Allergies  Objective:  There were no vitals filed for this visit. Constitutional Patient is a pleasant 60 y.o. African American male WD, WN in NAD. AAO x 3.  Vascular Faintly palpable DP pulse(s) b/l lower extremities. Nonpalpable PT pulse(s) b/l lower extremities. Pedal hair sparse. Lower extremity skin temperature gradient within normal limits. No pain with calf compression b/l. No edema noted b/l lower extremities. No cyanosis or clubbing noted.  Neurologic Normal speech. Protective sensation diminished with 10g monofilament b/l.  Dermatologic Pedal skin with normal turgor, texture and tone bilaterally. No open wounds bilaterally. No interdigital macerations  bilaterally. Toenails 1-5 b/l elongated, discolored, dystrophic, thickened, crumbly with subungual debris and tenderness to dorsal palpation. No hyperkeratotic nor porokeratotic lesions present on today's visit.  Orthopedic: Normal muscle strength 5/5 to all lower extremity muscle groups bilaterally. No pain crepitus or joint limitation noted with ROM b/l. Hallux valgus with bunion deformity noted b/l lower extremities. Hammertoes noted to the 2-5 bilaterally.   Hemoglobin A1C Latest Ref Rng & Units 05/05/2020 12/05/2019  HGBA1C 4.0 - 5.6 % 10.0(A) 10.0(H)  Some recent data might be hidden   Assessment:   1. Pain due to onychomycosis of toenails of both feet   2. Hallux valgus, acquired, bilateral   3. Acquired hammertoes of both feet   4. Type 2 diabetes mellitus with diabetic polyneuropathy, with long-term current use of insulin ([place])    Plan:  Patient was evaluated and treated and all questions answered.  Onychomycosis with pain -Nails palliatively debridement as below. -Educated on self-care  Procedure: Nail Debridement Rationale: Pain Type of Debridement: manual, sharp debridement. Instrumentation: Nail nipper, rotary burr. Number of Nails: 10  -Examined patient. -No new findings. No new orders. -Continue diabetic foot care principles. -Patient to continue soft, supportive shoe gear daily. -Toenails 1-5 b/l were debrided in length and girth with sterile nail nippers and dremel without iatrogenic bleeding.  -Patient to report any pedal injuries to medical professional immediately. -Patient/POA to call should there be question/concern in the interim.  Return in about 3 months (around 01/09/2021).  Marzetta Board, DPM

## 2020-10-24 ENCOUNTER — Other Ambulatory Visit: Payer: Self-pay | Admitting: "Endocrinology

## 2020-10-24 ENCOUNTER — Other Ambulatory Visit: Payer: Self-pay | Admitting: Internal Medicine

## 2020-10-24 DIAGNOSIS — N400 Enlarged prostate without lower urinary tract symptoms: Secondary | ICD-10-CM

## 2020-10-28 ENCOUNTER — Ambulatory Visit: Payer: 59 | Admitting: Internal Medicine

## 2020-10-28 ENCOUNTER — Other Ambulatory Visit: Payer: Self-pay

## 2020-10-28 ENCOUNTER — Encounter: Payer: Self-pay | Admitting: Internal Medicine

## 2020-10-28 VITALS — BP 140/90 | HR 89 | Temp 97.8°F | Ht 66.0 in | Wt 185.0 lb

## 2020-10-28 DIAGNOSIS — K219 Gastro-esophageal reflux disease without esophagitis: Secondary | ICD-10-CM

## 2020-10-28 DIAGNOSIS — I10 Essential (primary) hypertension: Secondary | ICD-10-CM

## 2020-10-28 DIAGNOSIS — E1139 Type 2 diabetes mellitus with other diabetic ophthalmic complication: Secondary | ICD-10-CM

## 2020-10-28 DIAGNOSIS — E11311 Type 2 diabetes mellitus with unspecified diabetic retinopathy with macular edema: Secondary | ICD-10-CM

## 2020-10-28 DIAGNOSIS — N400 Enlarged prostate without lower urinary tract symptoms: Secondary | ICD-10-CM

## 2020-10-28 DIAGNOSIS — Z794 Long term (current) use of insulin: Secondary | ICD-10-CM

## 2020-10-28 DIAGNOSIS — N529 Male erectile dysfunction, unspecified: Secondary | ICD-10-CM

## 2020-10-28 MED ORDER — TADALAFIL 20 MG PO TABS: 10.0000 mg | ORAL_TABLET | Freq: Every day | ORAL | 3 refills | Status: DC | PRN

## 2020-10-28 MED ORDER — LOSARTAN POTASSIUM 50 MG PO TABS: 50.0000 mg | ORAL_TABLET | Freq: Every day | ORAL | 1 refills | Status: DC

## 2020-10-28 NOTE — Assessment & Plan Note (Signed)
HbA1C: 10.0 in 04/2020 On Lantus 24 U qHS and Glipizide 5 mg QD Does not Lantus as prescribed, takes lower doses if blood glucose around 100 Advised to schedule appointment with Dr Emeline Darling to follow diabetic diet On statin F/u CMP and lipid panel in the next visit Diabetic eye exam: Advised to continue to follow up with Ophthalmology for diabetic eye exam

## 2020-10-28 NOTE — Assessment & Plan Note (Signed)
Follows up with Ophthalmologist

## 2020-10-28 NOTE — Patient Instructions (Signed)
Please continue to take medications as prescribed.  Please take Cialis instead of Sildenafil.  Please continue to follow low carbohydrate and low sodium diet.  Please contact Dr Isidoro Donning office to schedule appointment.

## 2020-10-28 NOTE — Assessment & Plan Note (Signed)
Well-controlled with Flomax and Finasteride °

## 2020-10-28 NOTE — Assessment & Plan Note (Signed)
Tried Sildenafil 50 mg PRN, states it is not efficient Will give trial of Cialis

## 2020-10-28 NOTE — Progress Notes (Signed)
Established Patient Office Visit  Subjective:  Patient ID: Chad Kirk, male    DOB: 08/30/1960  Age: 60 y.o. MRN: [id number]  CC:  Chief Complaint  Patient presents with  . Follow-up    HPI Chad Kirk is a 60 year old male with past medical history of uncontrolled diabetes mellitus type 2, PVD, hypertension, hyperlipidemia, diabetic macular edema, BPH and alcohol abuse who presents for follow up of his chronic medical conditions.  HTN: BP is uncontrolled. States that he takes medications regularly, but later states that he did not get Losartan prescription - refill sent. He states that he got limited supply from local pharmacy, but original prescription was sent to Mail order pharmacy. Patient denies headache, dizziness, chest pain, dyspnea or palpitations.  DM: He follows up with Dr. Dorris Fetch for diabetes management.  He takes Lantus regularly, but takes lower doses at times if his blood glucose is around 100 and has a continuous glucose monitor, which shows controlled blood glucose numbers.  Blood glucose readings were reviewed with the patient.  He is advised to follow diabetic diet as instructed.  He follows up with ophthalmologist for diabetic macular edema.  Erectile dysfunction: He tried Sildenafil, but states that he did not have efficient response, he gets adequate erection intermittently.   Past Medical History:  Diagnosis Date  . Diabetes mellitus without complication ([place])    DIAGNOSED AGE 30  . Hypertension     Past Surgical History:  Procedure Laterality Date  . ESOPHAGEAL DILATION N/A 12/07/2019   Procedure: ESOPHAGEAL OR PYLORIC DILATION;  Surgeon: Danie Binder, MD;  Location: [facility];  Service: Endoscopy;  Laterality: N/A;  . ESOPHAGOGASTRODUODENOSCOPY N/A 12/07/2019   erosive gastritis, many non-bleeding cratered and superficial duodenal ulcers without stigmata of bleeding in duodenal bulb. Empiric dilation due to possible occult esophageal web. Negative  H.pylori.   . EYE SURGERY  03/02/2020   diabetic retinopathy, lens placement, cataract removal, right eye  . HERNIA REPAIR Left 1990    Family History  Problem Relation Age of Onset  . Stroke Mother   . Hypertension Mother   . Diabetes Mother   . Hypertension Father   . Diabetes Father   . Colon cancer Neg Hx   . Colon polyps Neg Hx     Social History   Socioeconomic History  . Marital status: Married    Spouse name: Not on file  . Number of children: Not on file  . Years of education: Not on file  . Highest education level: Not on file  Occupational History  . Not on file  Tobacco Use  . Smoking status: Former Smoker    Packs/day: 1.00    Years: 5.00    Pack years: 5.00    Quit date: 08/20/1988    Years since quitting: 32.2  . Smokeless tobacco: Never Used  Vaping Use  . Vaping Use: Never used  Substance and Sexual Activity  . Alcohol use: Not Currently    Comment: denied 03/15/20  . Drug use: No  . Sexual activity: Not on file  Other Topics Concern  . Not on file  Social History Narrative  . Not on file   Social Determinants of Health   Financial Resource Strain: Not on file  Food Insecurity: Not on file  Transportation Needs: Not on file  Physical Activity: Not on file  Stress: Not on file  Social Connections: Not on file  Intimate Partner Violence: Not on file    Outpatient Medications Prior  to Visit  Medication Sig Dispense Refill  . Blood Glucose Monitoring Suppl (ACCU-CHEK GUIDE ME) w/Device KIT 1 Piece by Does not apply route as directed. 1 kit 0  . brimonidine (ALPHAGAN) 0.2 % ophthalmic solution Place 1 drop into the left eye 3 (three) times daily.    . Carboxymethylcellulose Sodium (LUBRICANT EYE DROPS OP) Apply 1 Container to eye as needed (dry eye). Left eye    . Continuous Blood Gluc Receiver (FREESTYLE LIBRE 2 READER) DEVI As directed 1 each 0  . Continuous Blood Gluc Sensor (FREESTYLE LIBRE 2 SENSOR) MISC USE ONE SENSOR EVERY 14 DAYS 2 each  0  . dorzolamide (TRUSOPT) 2 % ophthalmic solution Place 1 drop into the left eye 3 (three) times daily.    . finasteride (PROSCAR) 5 MG tablet Take 1 tablet by mouth once daily 30 tablet 0  . glipiZIDE (GLUCOTROL XL) 5 MG 24 hr tablet Take 1 tablet (5 mg total) by mouth daily with breakfast. 90 tablet 1  . glucose blood (ACCU-CHEK GUIDE) test strip Use as instructed 150 each 2  . Insulin Pen Needle (B-D ULTRAFINE III SHORT PEN) 31G X 8 MM MISC 1 each by Does not apply route as directed. 100 each 3  . LANTUS SOLOSTAR 100 UNIT/ML Solostar Pen INJECT 24 UNITS SUBCUTANEOUSLY AT BEDTIME 15 mL 0  . moxifloxacin (VIGAMOX) 0.5 % ophthalmic solution     . Multiple Vitamins-Minerals (QC DAILY MULTIVIT/MULTIMINERAL PO) Take 1 tablet by mouth daily.    . Probiotic Product (PROBIOTIC PO) Take 1 tablet by mouth daily.    . rosuvastatin (CRESTOR) 10 MG tablet Take 1 tablet (10 mg total) by mouth daily. 90 tablet 1  . senna-docusate (SENOKOT-S) 8.6-50 MG tablet Take 1 tablet by mouth as needed.     . tamsulosin (FLOMAX) 0.4 MG CAPS capsule Take 1 capsule (0.4 mg total) by mouth daily. 30 capsule 3  . timolol (TIMOPTIC) 0.5 % ophthalmic solution Place 1 drop into both eyes 3 times daily.    Marland Kitchen UNABLE TO FIND Take 5 mLs by mouth every 4 (four) hours as needed. Med Name: Apothecary Cough Syrup Take 5 mL by mouth every 4 to 6 hours as needed for cough 120 mL 0  . amLODipine (NORVASC) 10 MG tablet Take 10 mg by mouth daily.    Marland Kitchen losartan (COZAAR) 50 MG tablet TAKE 1 TABLET BY MOUTH  DAILY 90 tablet 3  . sildenafil (VIAGRA) 50 MG tablet Take 1 tablet (50 mg total) by mouth daily as needed for erectile dysfunction. 10 tablet 0   No facility-administered medications prior to visit.    No Known Allergies  ROS Review of Systems  Constitutional: Negative for chills and fever.  HENT: Negative for congestion and sore throat.   Eyes: Positive for visual disturbance. Negative for pain and discharge.  Respiratory:  Negative for cough and shortness of breath.   Cardiovascular: Negative for chest pain and palpitations.  Gastrointestinal: Negative for constipation, diarrhea, nausea and vomiting.  Endocrine: Negative for polydipsia and polyuria.  Genitourinary: Negative for dysuria and hematuria.  Musculoskeletal: Negative for neck pain and neck stiffness.  Skin: Negative for rash.  Neurological: Negative for dizziness, weakness, numbness and headaches.  Psychiatric/Behavioral: Negative for agitation and behavioral problems.      Objective:    Physical Exam Vitals reviewed.  Constitutional:      General: He is not in acute distress.    Appearance: He is not diaphoretic.  HENT:     Head:  Normocephalic and atraumatic.     Nose: Nose normal.     Mouth/Throat:     Mouth: Mucous membranes are moist.  Eyes:     General: No scleral icterus.    Extraocular Movements: Extraocular movements intact.     Pupils: Pupils are equal, round, and reactive to light.  Cardiovascular:     Rate and Rhythm: Normal rate and regular rhythm.     Heart sounds: Normal heart sounds. No murmur heard.     Comments: DPA pulse 1+ b/l Pulmonary:     Breath sounds: Normal breath sounds. No wheezing or rales.  Abdominal:     Palpations: Abdomen is soft.     Tenderness: There is no abdominal tenderness.  Musculoskeletal:     Cervical back: Neck supple. No tenderness.     Right lower leg: No edema.     Left lower leg: No edema.  Skin:    General: Skin is warm.     Findings: No rash.     Comments: No ulcer or skin breakdown on feet  Neurological:     General: No focal deficit present.     Mental Status: He is alert and oriented to person, place, and time.     Cranial Nerves: No cranial nerve deficit.     Sensory: No sensory deficit.     Motor: No weakness.  Psychiatric:        Mood and Affect: Mood normal.        Behavior: Behavior normal.     BP 140/90 (BP Location: Right Arm, Patient Position: Sitting, Cuff  Size: Normal)   Pulse 89   Temp 97.8 F (36.6 C) (Oral)   Ht '5\' 6"'  (1.676 m)   Wt 185 lb (83.9 kg)   SpO2 96%   BMI 29.86 kg/m  Wt Readings from Last 3 Encounters:  10/28/20 185 lb (83.9 kg)  06/30/20 175 lb 6.4 oz (79.6 kg)  06/28/20 177 lb (80.3 kg)     Health Maintenance Due  Topic Date Due  . Hepatitis C Screening  Never done  . OPHTHALMOLOGY EXAM  Never done  . COLONOSCOPY (Pts 45-1yr Insurance coverage will need to be confirmed)  Never done    There are no preventive care reminders to display for this patient.  No results found for: TSH Lab Results  Component Value Date   WBC 5.2 12/08/2019   HGB 13.0 12/08/2019   HCT 41.3 12/08/2019   MCV 86.2 12/08/2019   PLT 193 12/08/2019   Lab Results  Component Value Date   NA 138 12/30/2019   K 4.6 12/30/2019   CO2 27 12/30/2019   GLUCOSE 256 (H) 12/30/2019   BUN 21 (H) 12/30/2019   CREATININE 1.11 12/30/2019   BILITOT 0.6 12/30/2019   ALKPHOS 65 12/30/2019   AST 16 12/30/2019   ALT 22 12/30/2019   PROT 7.8 12/30/2019   ALBUMIN 3.7 12/30/2019   CALCIUM 9.4 12/30/2019   ANIONGAP 8 12/30/2019   Lab Results  Component Value Date   CHOL 212 (H) 12/30/2019   Lab Results  Component Value Date   HDL 48 12/30/2019   Lab Results  Component Value Date   LDLCALC 149 (H) 12/30/2019   Lab Results  Component Value Date   TRIG 74 12/30/2019   Lab Results  Component Value Date   CHOLHDL 4.4 12/30/2019   Lab Results  Component Value Date   HGBA1C 10.0 (A) 05/05/2020      Assessment & Plan:  Problem List Items Addressed This Visit      Cardiovascular and Mediastinum   Essential hypertension - Primary    BP Readings from Last 1 Encounters:  10/28/20 140/90   uncontrolled due to noncompliance Refilled Losartan 50 mg QD Counseled for compliance with the medications Advised DASH diet and moderate exercise/walking, at least 150 mins/week       Relevant Medications   tadalafil (CIALIS) 20 MG tablet    losartan (COZAAR) 50 MG tablet     Digestive   GERD (gastroesophageal reflux disease)    Avoid hot and spicy food If persistent, will start Pepcid        Endocrine   Diabetes mellitus ([place])    HbA1C: 10.0 in 04/2020 On Lantus 24 U qHS and Glipizide 5 mg QD Does not Lantus as prescribed, takes lower doses if blood glucose around 100 Advised to schedule appointment with Dr Sallyanne Havers to follow diabetic diet On statin F/u CMP and lipid panel in the next visit Diabetic eye exam: Advised to continue to follow up with Ophthalmology for diabetic eye exam       Relevant Medications   losartan (COZAAR) 50 MG tablet   Diabetic macular edema ([place])    Follows up with Ophthalmologist      Relevant Medications   losartan (COZAAR) 50 MG tablet     Genitourinary   BPH (benign prostatic hyperplasia)    Well-controlled with Flomax and Finasteride        Other   Erectile dysfunction    Tried Sildenafil 50 mg PRN, states it is not efficient Will give trial of Cialis      Relevant Medications   tadalafil (CIALIS) 20 MG tablet      Meds ordered this encounter  Medications  . tadalafil (CIALIS) 20 MG tablet    Sig: Take 0.5 tablets (10 mg total) by mouth daily as needed for erectile dysfunction.    Dispense:  5 tablet    Refill:  3  . losartan (COZAAR) 50 MG tablet    Sig: Take 1 tablet (50 mg total) by mouth daily.    Dispense:  90 tablet    Refill:  1    Follow-up: Return in about 4 months (around 02/27/2021) for Annual physical.    [name], MD

## 2020-10-28 NOTE — Assessment & Plan Note (Signed)
Avoid hot and spicy food If persistent, will start Pepcid

## 2020-10-28 NOTE — Assessment & Plan Note (Signed)
BP Readings from Last 1 Encounters:  10/28/20 140/90   uncontrolled due to noncompliance Refilled Losartan 50 mg QD Counseled for compliance with the medications Advised DASH diet and moderate exercise/walking, at least 150 mins/week

## 2020-10-31 ENCOUNTER — Ambulatory Visit: Payer: 59

## 2020-11-03 ENCOUNTER — Other Ambulatory Visit: Payer: Self-pay

## 2020-11-03 DIAGNOSIS — N400 Enlarged prostate without lower urinary tract symptoms: Secondary | ICD-10-CM

## 2020-11-03 DIAGNOSIS — N529 Male erectile dysfunction, unspecified: Secondary | ICD-10-CM

## 2020-11-03 LAB — COMPREHENSIVE METABOLIC PANEL
ALT: 43 IU/L (ref 0–44)
AST: 30 IU/L (ref 0–40)
Albumin/Globulin Ratio: 1.6 (ref 1.2–2.2)
Albumin: 4.5 g/dL (ref 3.8–4.9)
Alkaline Phosphatase: 81 IU/L (ref 44–121)
BUN/Creatinine Ratio: 14 (ref 10–24)
BUN: 17 mg/dL (ref 8–27)
Bilirubin Total: 0.3 mg/dL (ref 0.0–1.2)
CO2: 22 mmol/L (ref 20–29)
Calcium: 9.7 mg/dL (ref 8.6–10.2)
Chloride: 104 mmol/L (ref 96–106)
Creatinine, Ser: 1.23 mg/dL (ref 0.76–1.27)
Globulin, Total: 2.9 g/dL (ref 1.5–4.5)
Glucose: 104 mg/dL — ABNORMAL HIGH (ref 65–99)
Potassium: 4.6 mmol/L (ref 3.5–5.2)
Sodium: 140 mmol/L (ref 134–144)
Total Protein: 7.4 g/dL (ref 6.0–8.5)
eGFR: 67 mL/min/{1.73_m2} (ref >59)

## 2020-11-03 LAB — LIPID PANEL
Chol/HDL Ratio: 3.5 ratio (ref 0.0–5.0)
Cholesterol, Total: 119 mg/dL (ref 100–199)
HDL: 34 mg/dL — ABNORMAL LOW (ref >39)
LDL Chol Calc (NIH): 69 mg/dL (ref 0–99)
Triglycerides: 82 mg/dL (ref 0–149)
VLDL Cholesterol Cal: 16 mg/dL (ref 5–40)

## 2020-11-03 LAB — T4, FREE: Free T4: 1.44 ng/dL (ref 0.82–1.77)

## 2020-11-03 LAB — TSH: TSH: 1.92 u[IU]/mL (ref 0.450–4.500)

## 2020-11-03 MED ORDER — TAMSULOSIN HCL 0.4 MG PO CAPS: 0.4000 mg | ORAL_CAPSULE | Freq: Every day | ORAL | 1 refills | Status: DC

## 2020-11-03 MED ORDER — TADALAFIL 20 MG PO TABS: 10.0000 mg | ORAL_TABLET | Freq: Every day | ORAL | 3 refills | Status: DC | PRN

## 2020-11-05 ENCOUNTER — Other Ambulatory Visit: Payer: Self-pay | Admitting: "Endocrinology

## 2020-11-07 ENCOUNTER — Encounter: Payer: Self-pay | Admitting: "Endocrinology

## 2020-11-07 ENCOUNTER — Ambulatory Visit (INDEPENDENT_AMBULATORY_CARE_PROVIDER_SITE_OTHER): Payer: 59 | Admitting: "Endocrinology

## 2020-11-07 ENCOUNTER — Other Ambulatory Visit: Payer: Self-pay

## 2020-11-07 VITALS — BP 124/75 | HR 91 | Ht 66.0 in | Wt 183.6 lb

## 2020-11-07 DIAGNOSIS — E1165 Type 2 diabetes mellitus with hyperglycemia: Secondary | ICD-10-CM | POA: Diagnosis not present

## 2020-11-07 DIAGNOSIS — E11311 Type 2 diabetes mellitus with unspecified diabetic retinopathy with macular edema: Secondary | ICD-10-CM

## 2020-11-07 DIAGNOSIS — IMO0002 Reserved for concepts with insufficient information to code with codable children: Secondary | ICD-10-CM

## 2020-11-07 LAB — POCT GLYCOSYLATED HEMOGLOBIN (HGB A1C): HbA1c, POC (controlled diabetic range): 8.5 % — AB (ref 0.0–7.0)

## 2020-11-07 MED ORDER — GLIPIZIDE ER 5 MG PO TB24: 5.0000 mg | ORAL_TABLET | Freq: Every day | ORAL | 1 refills | Status: DC

## 2020-11-07 MED ORDER — FREESTYLE LIBRE 2 SENSOR MISC: 2 refills | Status: DC

## 2020-11-07 MED ORDER — LANTUS SOLOSTAR 100 UNIT/ML ~~LOC~~ SOPN: PEN_INJECTOR | SUBCUTANEOUS | 1 refills | Status: DC

## 2020-11-07 NOTE — Progress Notes (Signed)
a1c

## 2020-11-07 NOTE — Progress Notes (Signed)
11/07/2020, 4:32 PM  Endocrinology follow-up note   Subjective:    Patient ID: Chad Kirk, male    DOB: 1960/10/08.  Chad Kirk is being seen in follow-up after he was seen in consultation for management of currently uncontrolled symptomatic diabetes requested by  [name], MD.   Past Medical History:  Diagnosis Date   Diabetes mellitus without complication ([facility])    DIAGNOSED AGE 60   Hypertension     Past Surgical History:  Procedure Laterality Date   ESOPHAGEAL DILATION N/A 12/07/2019   Procedure: ESOPHAGEAL OR PYLORIC DILATION;  Surgeon: Danie Binder, MD;  Location: [facility];  Service: Endoscopy;  Laterality: N/A;   ESOPHAGOGASTRODUODENOSCOPY N/A 12/07/2019   erosive gastritis, many non-bleeding cratered and superficial duodenal ulcers without stigmata of bleeding in duodenal bulb. Empiric dilation due to possible occult esophageal web. Negative H.pylori.    EYE SURGERY  03/02/2020   diabetic retinopathy, lens placement, cataract removal, right eye   HERNIA REPAIR Left 1990    Social History   Socioeconomic History   Marital status: Married    Spouse name: Not on file   Number of children: Not on file   Years of education: Not on file   Highest education level: Not on file  Occupational History   Not on file  Tobacco Use   Smoking status: Former Smoker    Packs/day: 1.00    Years: 5.00    Pack years: 5.00    Quit date: 08/20/1988    Years since quitting: 32.2   Smokeless tobacco: Never Used  Vaping Use   Vaping Use: Never used  Substance and Sexual Activity   Alcohol use: Not Currently    Comment: denied 03/15/20   Drug use: No   Sexual activity: Not on file  Other Topics Concern   Not on file  Social History Narrative   Not on file   Social Determinants of Health   Financial Resource Strain: Not on file  Food Insecurity: Not on file   Transportation Needs: Not on file  Physical Activity: Not on file  Stress: Not on file  Social Connections: Not on file    Family History  Problem Relation Age of Onset   Stroke Mother    Hypertension Mother    Diabetes Mother    Hypertension Father    Diabetes Father    Colon cancer Neg Hx    Colon polyps Neg Hx     Outpatient Encounter Medications as of 11/07/2020  Medication Sig   Blood Glucose Monitoring Suppl (ACCU-CHEK GUIDE ME) w/Device KIT 1 Piece by Does not apply route as directed.   brimonidine (ALPHAGAN) 0.2 % ophthalmic solution Place 1 drop into the left eye 3 (three) times daily.   Carboxymethylcellulose Sodium (LUBRICANT EYE DROPS OP) Apply 1 Container to eye as needed (dry eye). Left eye   Continuous Blood Gluc Receiver (FREESTYLE LIBRE 2 READER) DEVI As directed   Continuous Blood Gluc Sensor (FREESTYLE LIBRE 2 SENSOR) MISC USE ONE SENSOR EVERY 14 DAYS   dorzolamide (TRUSOPT) 2 % ophthalmic solution Place 1 drop into the  left eye 3 (three) times daily.   finasteride (PROSCAR) 5 MG tablet Take 1 tablet by mouth once daily   glipiZIDE (GLUCOTROL XL) 5 MG 24 hr tablet Take 1 tablet (5 mg total) by mouth daily with breakfast.   glucose blood (ACCU-CHEK GUIDE) test strip Use as instructed   insulin glargine (LANTUS SOLOSTAR) 100 UNIT/ML Solostar Pen INJECT 24 UNITS SUBCUTANEOUSLY AT BEDTIME   Insulin Pen Needle (B-D ULTRAFINE III SHORT PEN) 31G X 8 MM MISC 1 each by Does not apply route as directed.   losartan (COZAAR) 50 MG tablet Take 1 tablet (50 mg total) by mouth daily.   moxifloxacin (VIGAMOX) 0.5 % ophthalmic solution    Multiple Vitamins-Minerals (QC DAILY MULTIVIT/MULTIMINERAL PO) Take 1 tablet by mouth daily.   Probiotic Product (PROBIOTIC PO) Take 1 tablet by mouth daily.   rosuvastatin (CRESTOR) 10 MG tablet Take 1 tablet (10 mg total) by mouth daily.   senna-docusate (SENOKOT-S) 8.6-50 MG tablet Take 1 tablet by mouth as needed.     tadalafil (CIALIS) 20 MG tablet Take 0.5 tablets (10 mg total) by mouth daily as needed for erectile dysfunction.   tamsulosin (FLOMAX) 0.4 MG CAPS capsule Take 1 capsule (0.4 mg total) by mouth daily.   timolol (TIMOPTIC) 0.5 % ophthalmic solution Place 1 drop into both eyes 3 times daily.   UNABLE TO FIND Take 5 mLs by mouth every 4 (four) hours as needed. Med Name: Apothecary Cough Syrup Take 5 mL by mouth every 4 to 6 hours as needed for cough   [DISCONTINUED] Continuous Blood Gluc Sensor (FREESTYLE LIBRE 2 SENSOR) MISC USE ONE SENSOR EVERY 14 DAYS   [DISCONTINUED] glipiZIDE (GLUCOTROL XL) 5 MG 24 hr tablet Take 1 tablet (5 mg total) by mouth daily with breakfast.   [DISCONTINUED] LANTUS SOLOSTAR 100 UNIT/ML Solostar Pen INJECT 24 UNITS SUBCUTANEOUSLY AT BEDTIME   No facility-administered encounter medications on file as of 11/07/2020.    ALLERGIES: No Known Allergies  VACCINATION STATUS: Immunization History  Administered Date(s) Administered   PFIZER(Purple Top)SARS-COV-2 Vaccination 10/17/2019, 11/07/2019   Pneumococcal Polysaccharide-23 06/30/2020    Diabetes He presents for his follow-up diabetic visit. He has type 2 diabetes mellitus. Onset time: He was diagnosed at approximate age of 65 years. His disease course has been improving. Hypoglycemia symptoms include nervousness/anxiousness, sweats and tremors. Pertinent negatives for hypoglycemia include no confusion, headaches, pallor or seizures. Pertinent negatives for diabetes include no blurred vision, no chest pain, no fatigue, no polydipsia, no polyphagia, no polyuria and no weakness. There are no hypoglycemic complications. Diabetic complications include nephropathy, peripheral neuropathy, PVD and retinopathy. (He is reporting legal blindness from diabetic retinopathy on both eyes.) Risk factors for coronary artery disease include dyslipidemia, diabetes mellitus, family history, male sex, hypertension and sedentary  lifestyle. Current diabetic treatments: He is currently on Lantus 15-20 units 1-2 times a day. His weight is fluctuating minimally. He is following a generally unhealthy diet. When asked about meal planning, he reported none. His home blood glucose trend is decreasing steadily. His breakfast blood glucose range is generally 140-180 mg/dl. His lunch blood glucose range is generally 140-180 mg/dl. His dinner blood glucose range is generally 140-180 mg/dl. His bedtime blood glucose range is generally 140-180 mg/dl. His overall blood glucose range is 140-180 mg/dl. (He recently obtained coverage for a CGM device.  He presents with his device showing 67% time in range, 31% above range, 0% hypoglycemia.  His point-of-care A1c is 8.5%, improving from 10%.  On his most  recent CGM, average blood glucose is 148.) He does not see a podiatrist.Eye exam is current.  Hyperlipidemia This is a chronic problem. The current episode started more than 1 year ago. The problem is uncontrolled. Recent lipid tests were reviewed and are high. Pertinent negatives include no chest pain, myalgias or shortness of breath. Risk factors for coronary artery disease include diabetes mellitus, dyslipidemia, hypertension, male sex, a sedentary lifestyle and family history.  Hypertension Associated symptoms include sweats. Pertinent negatives include no blurred vision, chest pain, headaches, neck pain, palpitations or shortness of breath. Risk factors for coronary artery disease include dyslipidemia, family history, diabetes mellitus, male gender and sedentary lifestyle. Past treatments include calcium channel blockers. Hypertensive end-organ damage includes PVD and retinopathy.     Review of Systems  Constitutional: Negative for chills, fatigue, fever and unexpected weight change.  HENT: Negative for dental problem, mouth sores and trouble swallowing.   Eyes: Negative for blurred vision and visual disturbance.  Respiratory: Negative for  cough, choking, chest tightness, shortness of breath and wheezing.   Cardiovascular: Negative for chest pain, palpitations and leg swelling.  Gastrointestinal: Negative for abdominal distention, abdominal pain, constipation, diarrhea, nausea and vomiting.  Endocrine: Negative for polydipsia, polyphagia and polyuria.  Genitourinary: Negative for dysuria, flank pain, hematuria and urgency.  Musculoskeletal: Negative for back pain, gait problem, myalgias and neck pain.  Skin: Negative for pallor, rash and wound.  Neurological: Positive for tremors. Negative for seizures, syncope, weakness, numbness and headaches.  Psychiatric/Behavioral: Negative for confusion and dysphoric mood. The patient is nervous/anxious.     Objective:    Vitals with BMI 11/07/2020 10/28/2020 06/30/2020  Height '5\' 6"'  '5\' 6"'  '5\' 9"'   Weight 183 lbs 10 oz 185 lbs 175 lbs 6 oz  BMI 29.65 93.23 55.73  Systolic 220 254 270  Diastolic 75 90 88  Pulse 91 89 79    BP 124/75    Pulse 91    Ht '5\' 6"'  (1.676 m)    Wt 183 lb 9.6 oz (83.3 kg)    BMI 29.63 kg/m   Wt Readings from Last 3 Encounters:  11/07/20 183 lb 9.6 oz (83.3 kg)  10/28/20 185 lb (83.9 kg)  06/30/20 175 lb 6.4 oz (79.6 kg)     Physical Exam Constitutional:      General: He is not in acute distress.    Appearance: He is well-developed.  HENT:     Head: Normocephalic and atraumatic.  Neck:     Thyroid: No thyromegaly.     Trachea: No tracheal deviation.  Cardiovascular:     Rate and Rhythm: Normal rate.     Pulses:          Dorsalis pedis pulses are 1+ on the right side and 1+ on the left side.       Posterior tibial pulses are 1+ on the right side and 1+ on the left side.     Heart sounds: Normal heart sounds, S1 normal and S2 normal. No murmur heard. No gallop.   Pulmonary:     Effort: Pulmonary effort is normal. No respiratory distress.     Breath sounds: No wheezing.  Abdominal:     General: There is no distension.     Tenderness: There is no  abdominal tenderness. There is no guarding.  Musculoskeletal:     Right shoulder: No swelling or deformity.     Cervical back: Normal range of motion and neck supple.     Comments: Signs of poor peripheral  circulation.  Has diminished monofilament test sensation as well as diminished dorsalis pedis and posterior tibial arterial pulses.  He does have long nails, calluses on his feet.    Skin:    General: Skin is warm and dry.     Findings: No rash.     Nails: There is no clubbing.  Neurological:     Mental Status: He is alert and oriented to person, place, and time.     Cranial Nerves: No cranial nerve deficit.     Sensory: No sensory deficit.     Gait: Gait normal.     Deep Tendon Reflexes: Reflexes are normal and symmetric.  Psychiatric:        Speech: Speech normal.        Behavior: Behavior normal. Behavior is cooperative.        Thought Content: Thought content normal.        Judgment: Judgment normal.     CMP ( most recent) CMP     Component Value Date/Time   NA 140 11/02/2020 0859   K 4.6 11/02/2020 0859   CL 104 11/02/2020 0859   CO2 22 11/02/2020 0859   GLUCOSE 104 (H) 11/02/2020 0859   GLUCOSE 256 (H) 12/30/2019 1218   BUN 17 11/02/2020 0859   CREATININE 1.23 11/02/2020 0859   CALCIUM 9.7 11/02/2020 0859   PROT 7.4 11/02/2020 0859   ALBUMIN 4.5 11/02/2020 0859   AST 30 11/02/2020 0859   ALT 43 11/02/2020 0859   ALKPHOS 81 11/02/2020 0859   BILITOT 0.3 11/02/2020 0859   GFRNONAA >60 12/30/2019 [place] >60 12/30/2019 1218     Diabetic Labs (most recent): Lab Results  Component Value Date   HGBA1C 8.5 (A) 11/07/2020   HGBA1C 10.0 (A) 05/05/2020   HGBA1C 10.0 (H) 12/05/2019     Lipid Panel ( most recent) Lipid Panel     Component Value Date/Time   CHOL 119 11/02/2020 0859   TRIG 82 11/02/2020 0859   HDL 34 (L) 11/02/2020 0859   CHOLHDL 3.5 11/02/2020 0859   CHOLHDL 4.4 12/30/2019 1218   VLDL 15 12/30/2019 1218   LDLCALC 69 11/02/2020 0859    LABVLDL 16 11/02/2020 0859      Assessment & Plan:   1. Type 2 diabetes, uncontrolled, with retinopathy with macular edema ([facility])  - Avari Gelles has currently uncontrolled symptomatic type 2 DM since  60 years of age.  He recently obtained coverage for a CGM device.  He presents with his device showing 67% time in range, 31% above range, 0% hypoglycemia.  His point-of-care A1c is 8.5%, improving from 10%.  On his most recent CGM, average blood glucose is 148.  - I had a long discussion with him about the progressive nature of diabetes and the pathology behind its complications. -his diabetes is complicated by retinopathy, peripheral arterial disease, peripheral neuropathy and he remains at a high risk for more acute and chronic complications which include CAD, CVA, CKD, retinopathy, and neuropathy. These are all discussed in detail with him.  - I have counseled him on diet  and weight management  by adopting a carbohydrate restricted/protein rich diet. Patient is encouraged to switch to  unprocessed or minimally processed     complex starch and increased protein intake (animal or plant source), fruits, and vegetables. -  he is advised to stick to a routine mealtimes to eat 3 meals  a day and avoid unnecessary snacks ( to snack only to correct hypoglycemia).   -  he acknowledges that there is a room for improvement in his food and drink choices. - Suggestion is made for him to avoid simple carbohydrates  from his diet including Cakes, Sweet Desserts, Ice Cream, Soda (diet and regular), Sweet Tea, Candies, Chips, Cookies, Store Bought Juices, Alcohol in Excess of  1-2 drinks a day, Artificial Sweeteners,  Coffee Creamer, and "Sugar-free" Products, Lemonade. This will help patient to have more stable blood glucose profile and potentially avoid unintended weight gain.  - he will be scheduled with Jearld Fenton, RDN, CDE for diabetes education.  - I have approached him with the following  individualized plan to manage  his diabetes and patient agrees:   -In light of his presentation with significantly improved glycemic profile, he will not need prandial insulin for now.    -Urging him to use his CGM at all times, and eat at least 3 meals a day, he is advised to continue Lantus 24 units nightly,  associated with monitoring of blood glucose strictly  4 times a day-before meals and at bedtime.   - he is warned not to take insulin without proper monitoring per orders.  - he is encouraged to call clinic for blood glucose levels less than 70 or above 200 mg /dl. -He has responded and benefited from low-dose glipizide.  He is advised to continue glipizide 5 mg XL p.o. daily at breakfast.   - Specific targets for  A1c;  LDL, HDL,  and Triglycerides were discussed with the patient.  2) Blood Pressure /Hypertension: His blood pressure is controlled to target.he is advised to continue his current medications including amlodipine 10 mg p.o.. daily with breakfast . 3) Lipids/Hyperlipidemia:   Review of his recent lipid panel showed un controlled  LDL at 149.  he  is not on a statin intervention.  He is advised to continue Crestor 10 mg p.o. nightly.   Side effects and precautions discussed with him.  4)  Weight/Diet:  Body mass index is 29.63 kg/m.  -   he is not a candidate for weight loss. Exercise, and detailed carbohydrates information provided  -  detailed on discharge instructions.  5) Chronic Care/Health Maintenance:  -he  is on  Statin medications and  is encouraged to initiate and continue to follow up with Ophthalmology, Dentist,  Podiatrist at least yearly or according to recommendations, and advised to   stay away from smoking. I have recommended yearly flu vaccine and pneumonia vaccine at least every 5 years; moderate intensity exercise for up to 150 minutes weekly; and  sleep for at least 7 hours a day.  His screening ABI was normal in September 2021.  His next study is due  in September 2027, or sooner if needed.  - he is  advised to maintain close follow up with his pcp for primary care needs, as well as his other providers for optimal and coordinated care.  - Time spent on this patient care encounter:  40 min, of which > 50% was spent in  counseling and the rest reviewing his blood glucose logs , discussing his hypoglycemia and hyperglycemia episodes, reviewing his current and  previous labs / studies  ( including abstraction from other facilities) and medications  doses and developing a  long term treatment plan and documenting his care.   Please refer to Patient Instructions for Blood Glucose Monitoring and Insulin/Medications Dosing Guide"  in media tab for additional information. Please  also refer to " Patient Self Inventory" in the Media  tab for reviewed elements of pertinent patient history.  Bradd Canary participated in the discussions, expressed understanding, and voiced agreement with the above plans.  All questions were answered to his satisfaction. he is encouraged to contact clinic should he have any questions or concerns prior to his return visit.    Follow up plan: - Return in about 3 months (around 02/07/2021) for Bring Meter and Logs- A1c in Office.  Glade Lloyd, MD [facility] [address] [place], [place] [address] Phone: [phone]  Fax: [phone]    11/07/2020, 4:32 PM  This note was partially dictated with voice recognition software. Similar sounding words can be transcribed inadequately or may not  be corrected upon review.

## 2020-11-07 NOTE — Patient Instructions (Signed)
                                     Advice for Weight Management  -For most of us the best way to lose weight is by diet management. Generally speaking, diet management means consuming less calories intentionally which over time brings about progressive weight loss.  This can be achieved more effectively by restricting carbohydrate consumption to the minimum possible.  So, it is critically important to know your numbers: how much calorie you are consuming and how much calorie you need. More importantly, our carbohydrates sources should be unprocessed or minimally processed complex starch food items.   Sometimes, it is important to balance nutrition by increasing protein intake (animal or plant source), fruits, and vegetables.  -Sticking to a routine mealtime to eat 3 meals a day and avoiding unnecessary snacks is shown to have a big role in weight control. Under normal circumstances, the only time we lose real weight is when we are hungry, so allow hunger to take place- hunger means no food between meal times, only water.  It is not advisable to starve.   -It is better to avoid simple carbohydrates including: Cakes, Sweet Desserts, Ice Cream, Soda (diet and regular), Sweet Tea, Candies, Chips, Cookies, Store Bought Juices, Alcohol in Excess of  1-2 drinks a day, Lemonade,  Artificial Sweeteners, Doughnuts, Coffee Creamers, "Sugar-free" Products, etc, etc.  This is not a complete list.....    -Consulting with certified diabetes educators is proven to provide you with the most accurate and current information on diet.  Also, you may be  interested in discussing diet options/exchanges , we can schedule a visit with Chad Kirk, RDN, CDE for individualized nutrition education.  -Exercise: If you are able: 30 -60 minutes a day ,4 days a week, or 150 minutes a week.  The longer the better.  Combine stretch, strength, and aerobic activities.  If you were told in the  past that you have high risk for cardiovascular diseases, you may seek evaluation by your heart doctor prior to initiating moderate to intense exercise programs.                                  Additional Care Considerations for Diabetes   -Diabetes  is a chronic disease.  The most important care consideration is regular follow-up with your diabetes care provider with the goal being avoiding or delaying its complications and to take advantage of advances in medications and technology.    -Type 2 diabetes is known to coexist with other important comorbidities such as high blood pressure and high cholesterol.  It is critical to control not only the diabetes but also the high blood pressure and high cholesterol to minimize and delay the risk of complications including coronary artery disease, stroke, amputations, blindness, etc.    - Studies showed that people with diabetes will benefit from a class of medications known as ACE inhibitors and statins.  Unless there are specific reasons not to be on these medications, the standard of care is to consider getting one from these groups of medications at an optimal doses.  These medications are generally considered safe and proven to help protect the heart and the kidneys.    - People with diabetes are encouraged to initiate and maintain regular follow-up with eye doctors, foot   doctors, dentists , and if necessary heart and kidney doctors.     - It is highly recommended that people with diabetes quit smoking or stay away from smoking, and get yearly  flu vaccine and pneumonia vaccine at least every 5 years.  One other important lifestyle recommendation is to ensure adequate sleep - at least 6-7 hours of uninterrupted sleep at night.  -Exercise: If you are able: 30 -60 minutes a day, 4 days a week, or 150 minutes a week.  The longer the better.  Combine stretch, strength, and aerobic activities.  If you were told in the past that you have high risk for  cardiovascular diseases, you may seek evaluation by your heart doctor prior to initiating moderate to intense exercise programs.          

## 2020-11-20 ENCOUNTER — Other Ambulatory Visit: Payer: Self-pay | Admitting: "Endocrinology

## 2020-11-22 LAB — HM DIABETES EYE EXAM

## 2020-12-03 IMAGING — DX DG ABDOMEN ACUTE W/ 1V CHEST
4 series · 4 of 4 positions shown · non-contrast
Comparison: None.

CLINICAL DATA: Nausea vomiting for several days

EXAM:
DG ABDOMEN ACUTE W/ 1V CHEST

[chest pa]
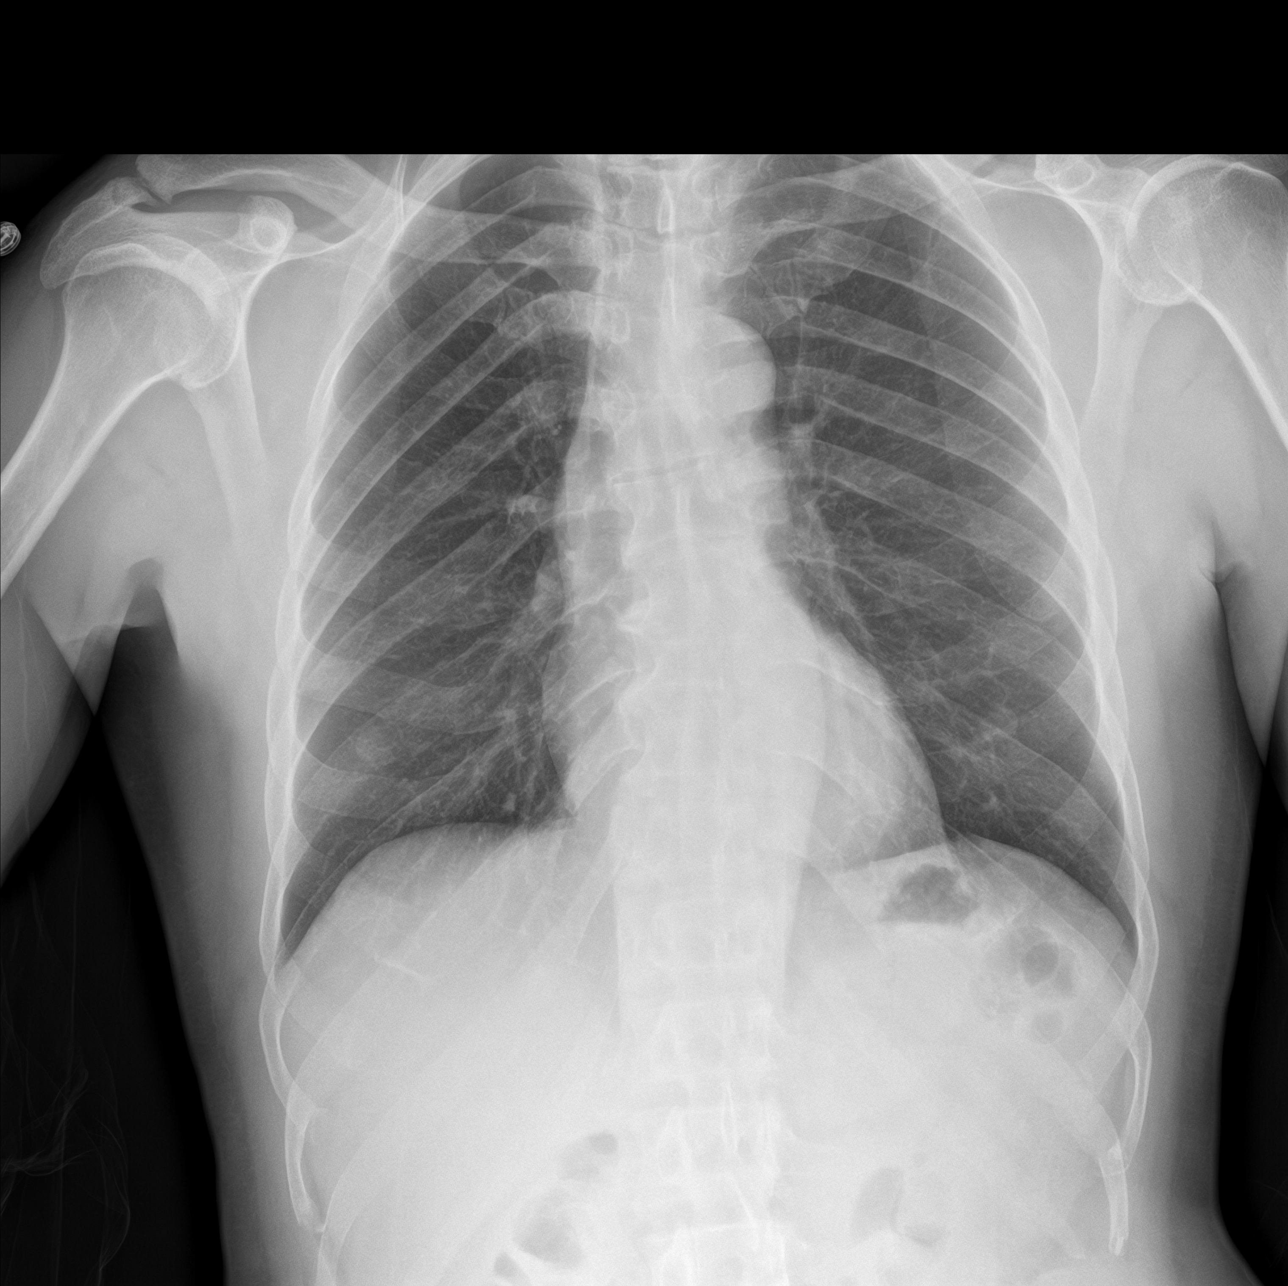

[abdomen erect]
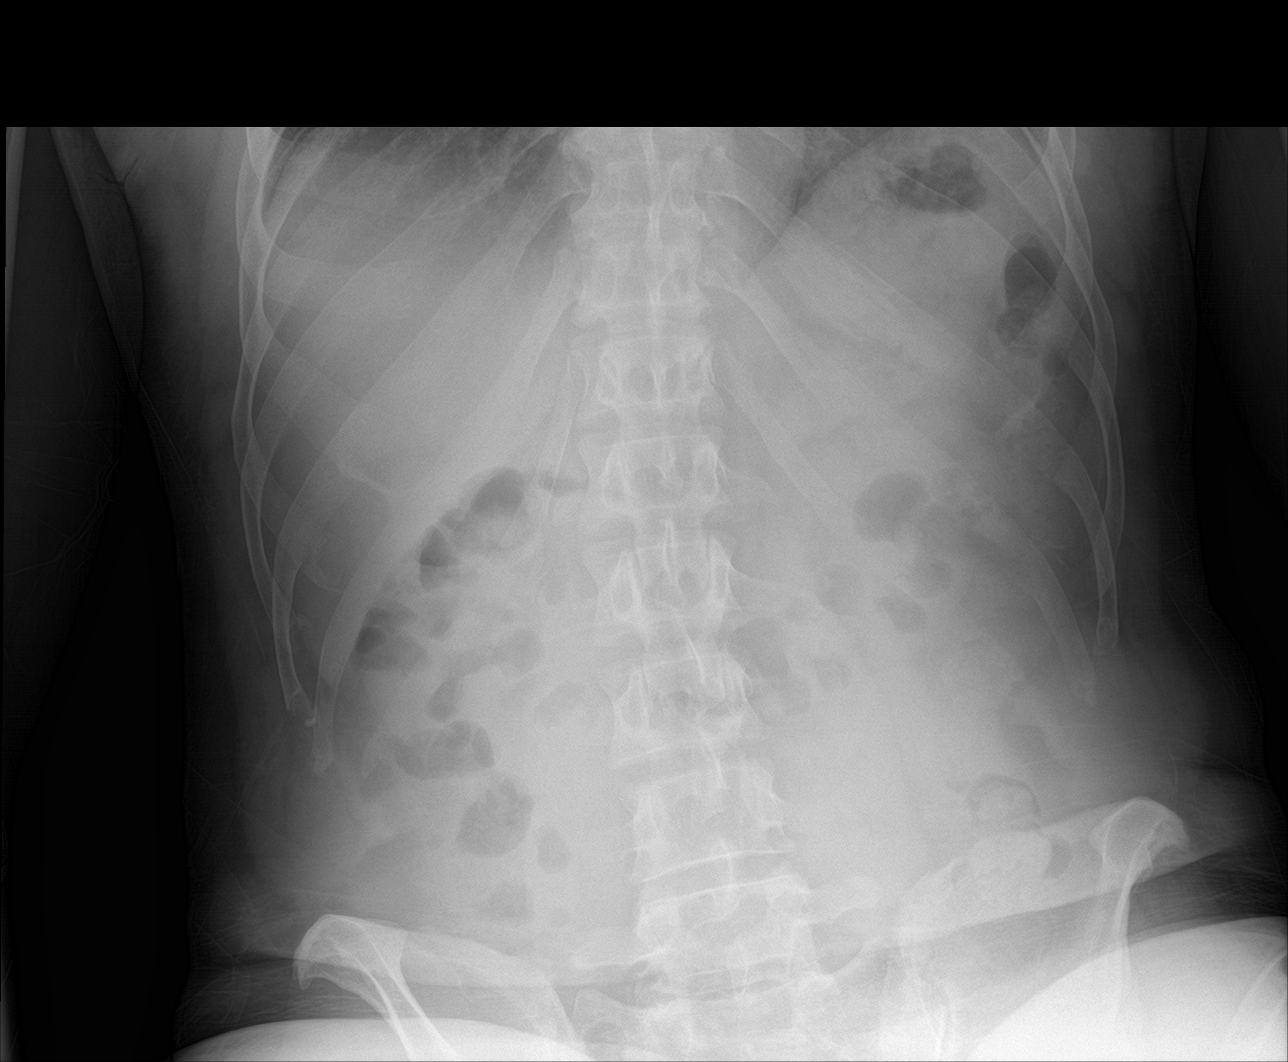

[abdomen supine (1 of 2)]
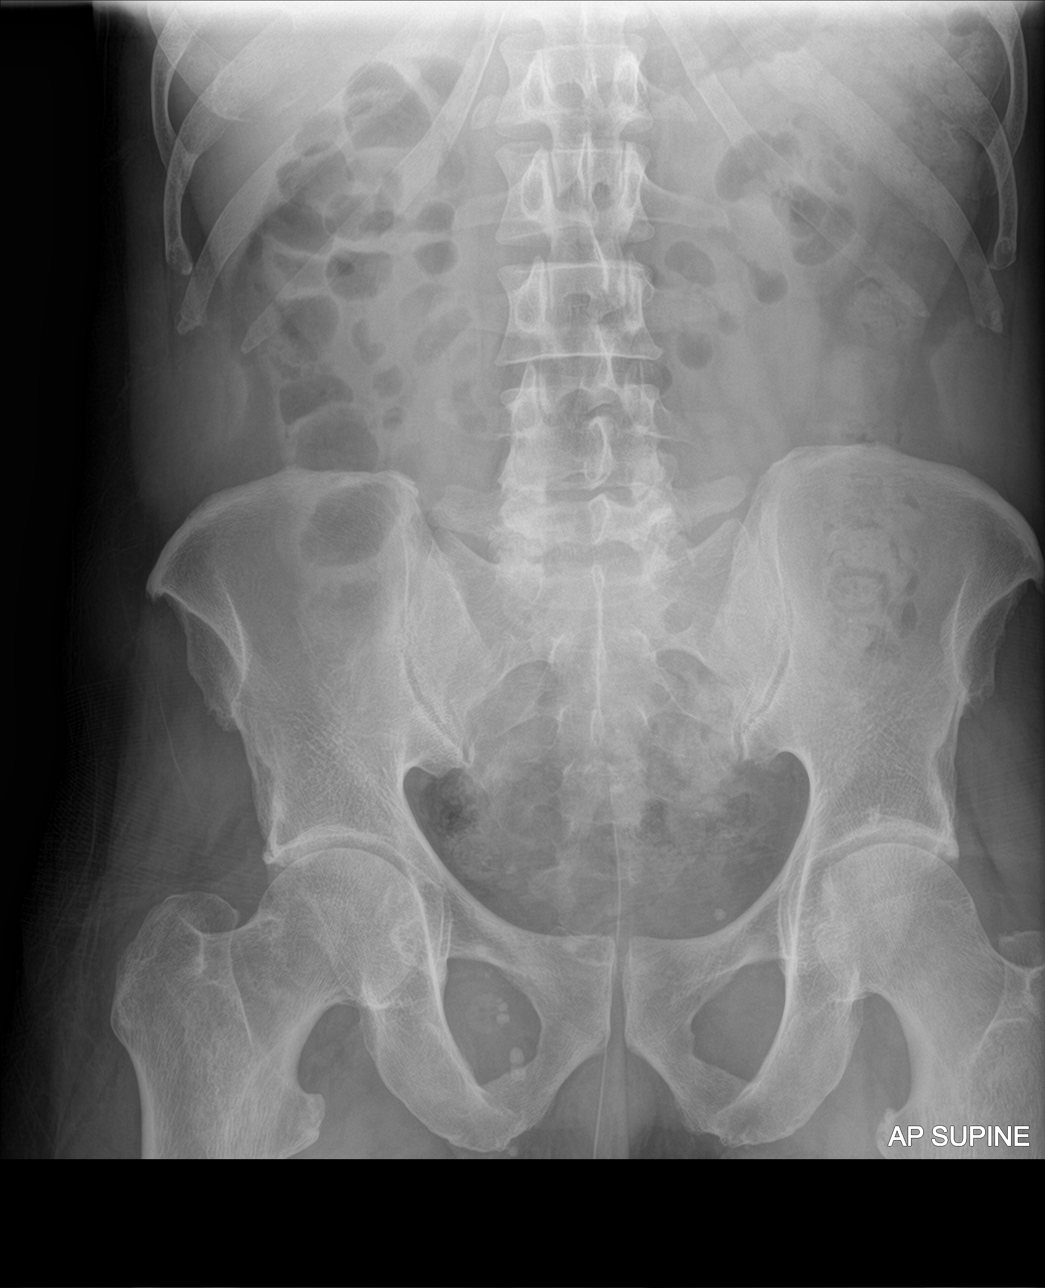

[abdomen supine (2 of 2)]
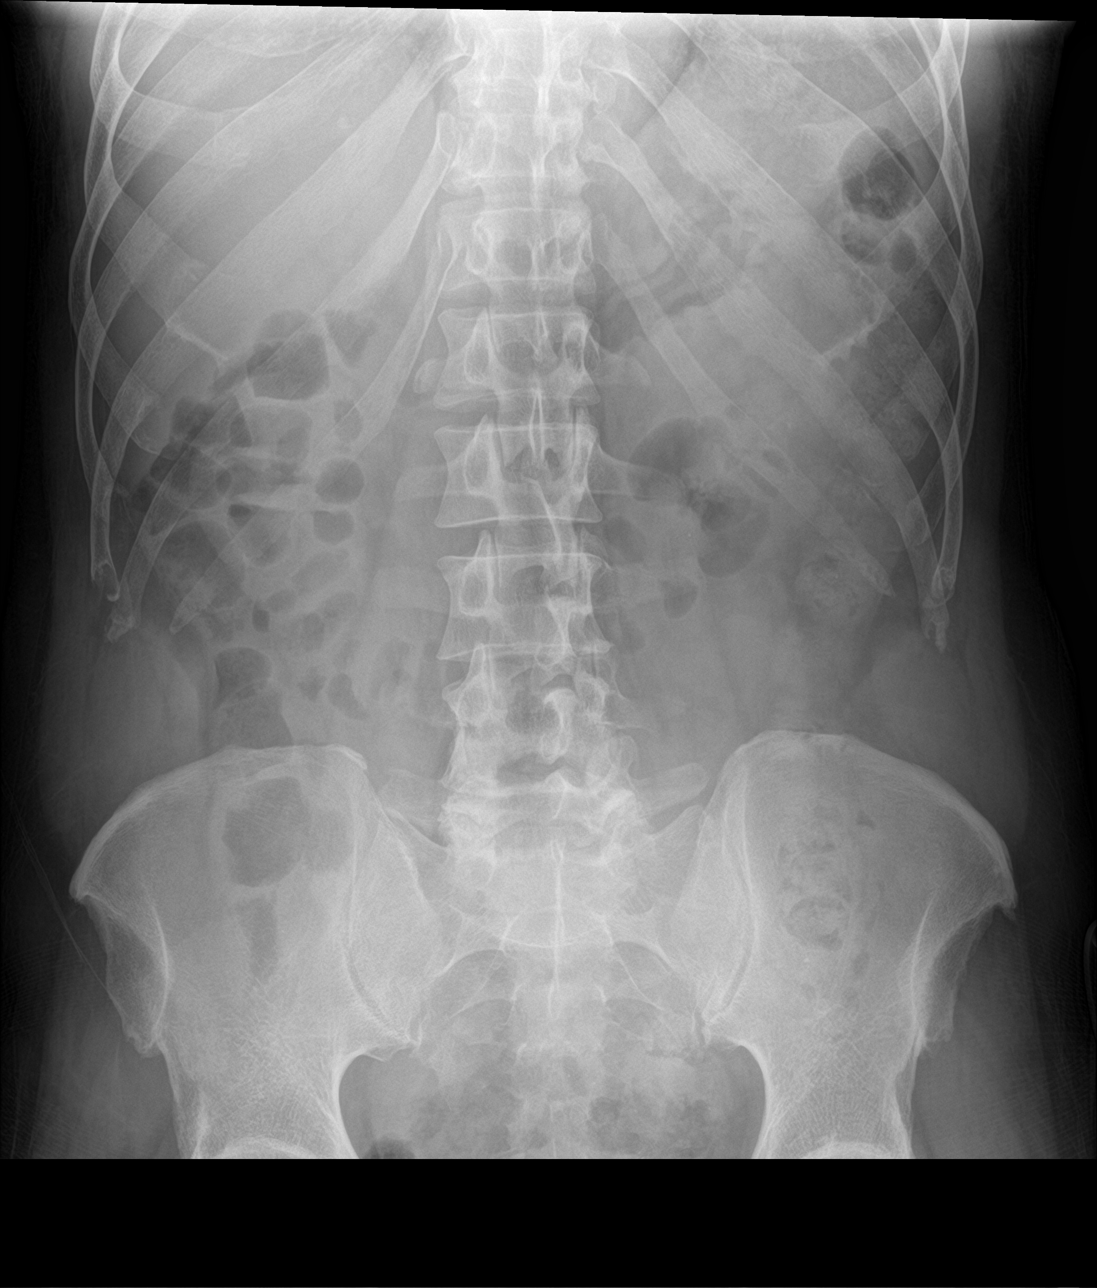

[4 of 4 positions shown; findings below may reference images not displayed]

FINDINGS: Cardiac shadows within normal limits. The lungs are well aerated
bilaterally. Bilateral nipple shadows are noted. No bony abnormality
is seen.

Scattered large and small bowel gas is noted. No free air is seen.
No abnormal mass or abnormal calcifications are noted. No bony
abnormality is seen.
IMPRESSION: No acute abnormality is noted in the chest and abdomen.

## 2020-12-07 ENCOUNTER — Telehealth: Payer: Self-pay | Admitting: Podiatry

## 2020-12-07 ENCOUNTER — Other Ambulatory Visit: Payer: 59

## 2020-12-07 NOTE — Telephone Encounter (Signed)
Called pt with benefits for the diabetic shoes as he has a Higher education careers adviser they are covered at 80% after 2500.00 deductible(met 720.98) I advised that the cost would go towards his deductible and he would get a bill for them. He wanted to discuss with wife and call us back.  He called back and left message that after discussing with wife and he wants to hold off on them for now. HE wanted to cancel appt for today.  I returned call to confirm we are canceling the appt. pt said that he has a pair of diabetic shoes at home and I asked if he had any  Diabetic inserts for them and he was not sure. I told pt that when he comes in to see you next time (6.8) to talk to you about the diabetic inserts. (Orthotics are excluded from pts plan)

## 2020-12-08 ENCOUNTER — Encounter: Payer: Self-pay | Admitting: *Deleted

## 2021-01-04 ENCOUNTER — Other Ambulatory Visit: Payer: Self-pay | Admitting: Internal Medicine

## 2021-01-04 ENCOUNTER — Other Ambulatory Visit: Payer: Self-pay | Admitting: "Endocrinology

## 2021-01-04 DIAGNOSIS — N400 Enlarged prostate without lower urinary tract symptoms: Secondary | ICD-10-CM

## 2021-01-19 ENCOUNTER — Other Ambulatory Visit: Payer: Self-pay | Admitting: "Endocrinology

## 2021-01-20 ENCOUNTER — Other Ambulatory Visit: Payer: Self-pay

## 2021-01-20 DIAGNOSIS — IMO0002 Reserved for concepts with insufficient information to code with codable children: Secondary | ICD-10-CM

## 2021-01-20 DIAGNOSIS — E1165 Type 2 diabetes mellitus with hyperglycemia: Secondary | ICD-10-CM

## 2021-01-20 MED ORDER — INSULIN GLARGINE 100 UNIT/ML SOLOSTAR PEN: 24.0000 [IU] | PEN_INJECTOR | Freq: Every day | SUBCUTANEOUS | 0 refills | Status: DC

## 2021-01-24 ENCOUNTER — Other Ambulatory Visit: Payer: Self-pay | Admitting: *Deleted

## 2021-01-24 DIAGNOSIS — N400 Enlarged prostate without lower urinary tract symptoms: Secondary | ICD-10-CM

## 2021-01-24 MED ORDER — FINASTERIDE 5 MG PO TABS: 5.0000 mg | ORAL_TABLET | Freq: Every day | ORAL | 0 refills | Status: DC

## 2021-01-25 ENCOUNTER — Other Ambulatory Visit: Payer: Self-pay

## 2021-01-25 ENCOUNTER — Ambulatory Visit (INDEPENDENT_AMBULATORY_CARE_PROVIDER_SITE_OTHER): Payer: 59

## 2021-01-25 ENCOUNTER — Ambulatory Visit (INDEPENDENT_AMBULATORY_CARE_PROVIDER_SITE_OTHER): Payer: 59 | Admitting: Podiatry

## 2021-01-25 DIAGNOSIS — E11621 Type 2 diabetes mellitus with foot ulcer: Secondary | ICD-10-CM

## 2021-01-25 DIAGNOSIS — E1139 Type 2 diabetes mellitus with other diabetic ophthalmic complication: Secondary | ICD-10-CM | POA: Diagnosis not present

## 2021-01-25 DIAGNOSIS — L97522 Non-pressure chronic ulcer of other part of left foot with fat layer exposed: Secondary | ICD-10-CM

## 2021-01-25 DIAGNOSIS — Z794 Long term (current) use of insulin: Secondary | ICD-10-CM

## 2021-01-25 DIAGNOSIS — E08621 Diabetes mellitus due to underlying condition with foot ulcer: Secondary | ICD-10-CM

## 2021-01-25 DIAGNOSIS — L97529 Non-pressure chronic ulcer of other part of left foot with unspecified severity: Secondary | ICD-10-CM

## 2021-01-25 DIAGNOSIS — L97521 Non-pressure chronic ulcer of other part of left foot limited to breakdown of skin: Secondary | ICD-10-CM

## 2021-01-25 HISTORY — DX: Type 2 diabetes mellitus with foot ulcer: E11.621

## 2021-01-25 HISTORY — DX: Type 2 diabetes mellitus with foot ulcer: L97.529

## 2021-01-27 ENCOUNTER — Other Ambulatory Visit: Payer: Self-pay | Admitting: *Deleted

## 2021-01-27 MED ORDER — GLIPIZIDE ER 5 MG PO TB24: 5.0000 mg | ORAL_TABLET | Freq: Every day | ORAL | 0 refills | Status: DC

## 2021-01-31 NOTE — Progress Notes (Signed)
Subjective:  Patient ID: Chad Kirk, male    DOB: 20-Oct-1960,  MRN: [id number]  Chief Complaint  Patient presents with   Nail Problem    Thick painful toenails, 3 month follow up    60 y.o. male presents for wound care.  Patient presents with new complaint of left hallux ulceration limited to the breakdown of the skin.  Patient states that has been going on for a little while since May 26.  Patient was wearing casual shoes of blood in the socks went to the beach as well afterwards.  He started noticing the skin peeling.  They have been putting Silvadene on it.  He has not seen anyone else prior to [place] seeing me.  They be taken but best to keep it protected.  He would like to get this treated immediately. For continued symptoms patient this is a diabetic with last A1c of 8.5.  He has not done anything else for it.  He denies any other acute complaints  Review of Systems: Negative except as noted in the HPI. Denies N/V/F/Ch.  Past Medical History:  Diagnosis Date   Diabetes mellitus without complication ([place])    DIAGNOSED AGE 31   Hypertension     Current Outpatient Medications:    Blood Glucose Monitoring Suppl (ACCU-CHEK GUIDE ME) w/Device KIT, 1 Piece by Does not apply route as directed., Disp: 1 kit, Rfl: 0   brimonidine (ALPHAGAN) 0.2 % ophthalmic solution, Place 1 drop into the left eye 3 (three) times daily., Disp: , Rfl:    Carboxymethylcellulose Sodium (LUBRICANT EYE DROPS OP), Apply 1 Container to eye as needed (dry eye). Left eye, Disp: , Rfl:    Continuous Blood Gluc Receiver (FREESTYLE LIBRE 2 READER) DEVI, As directed, Disp: 1 each, Rfl: 0   Continuous Blood Gluc Sensor (FREESTYLE LIBRE 2 SENSOR) MISC, USE ONE SENSOR EVERY 14 DAYS, Disp: 2 each, Rfl: 2   dorzolamide (TRUSOPT) 2 % ophthalmic solution, Place 1 drop into the left eye 3 (three) times daily., Disp: , Rfl:    finasteride (PROSCAR) 5 MG tablet, Take 1 tablet (5 mg total) by mouth daily., Disp: 30 tablet, Rfl: 0    glipiZIDE (GLUCOTROL XL) 5 MG 24 hr tablet, Take 1 tablet (5 mg total) by mouth daily with breakfast., Disp: 90 tablet, Rfl: 0   glucose blood (ACCU-CHEK GUIDE) test strip, Use as instructed, Disp: 150 each, Rfl: 2   insulin glargine (LANTUS) 100 UNIT/ML Solostar Pen, Inject 24 Units into the skin at bedtime., Disp: 15 mL, Rfl: 0   Insulin Pen Needle (B-D ULTRAFINE III SHORT PEN) 31G X 8 MM MISC, 1 each by Does not apply route as directed., Disp: 100 each, Rfl: 3   losartan (COZAAR) 50 MG tablet, Take 1 tablet (50 mg total) by mouth daily., Disp: 90 tablet, Rfl: 1   moxifloxacin (VIGAMOX) 0.5 % ophthalmic solution, , Disp: , Rfl:    Multiple Vitamins-Minerals (QC DAILY MULTIVIT/MULTIMINERAL PO), Take 1 tablet by mouth daily., Disp: , Rfl:    Probiotic Product (PROBIOTIC PO), Take 1 tablet by mouth daily., Disp: , Rfl:    rosuvastatin (CRESTOR) 10 MG tablet, Take 1 tablet by mouth once daily, Disp: 30 tablet, Rfl: 3   senna-docusate (SENOKOT-S) 8.6-50 MG tablet, Take 1 tablet by mouth as needed. , Disp: , Rfl:    tadalafil (CIALIS) 20 MG tablet, Take 0.5 tablets (10 mg total) by mouth daily as needed for erectile dysfunction., Disp: 5 tablet, Rfl: 3   tamsulosin (FLOMAX)  0.4 MG CAPS capsule, Take 1 capsule by mouth once daily, Disp: 30 capsule, Rfl: 0   timolol (TIMOPTIC) 0.5 % ophthalmic solution, Place 1 drop into both eyes 3 times daily., Disp: , Rfl:    UNABLE TO FIND, Take 5 mLs by mouth every 4 (four) hours as needed. Med Name: Apothecary Cough Syrup Take 5 mL by mouth every 4 to 6 hours as needed for cough, Disp: 120 mL, Rfl: 0  Social History   Tobacco Use  Smoking Status Former   Packs/day: 1.00   Years: 5.00   Pack years: 5.00   Types: Cigarettes   Quit date: 08/20/1988   Years since quitting: 32.4  Smokeless Tobacco Never    No Known Allergies Objective:  There were no vitals filed for this visit. There is no height or weight on file to calculate BMI. Constitutional Well  developed. Well nourished.  Vascular Dorsalis pedis pulses faintly palpable bilaterally. Posterior tibial pulses faintly palpable bilaterally. Capillary refill normal to all digits.  No cyanosis or clubbing noted. Pedal hair growth normal.  Neurologic Normal speech. Oriented to person, place, and time. Protective sensation absent  Dermatologic Wound Location: Left plantar hallux limited to the breakdown of the skin.  No purulent drainage noted no redness noted.  No clinical signs of malodor present. Wound Base: Mixed Granular/Fibrotic Peri-wound: Calloused Exudate: Scant/small amount Serosanguinous exudate Wound Measurements: -See below  Orthopedic: No pain to palpation either foot.   Radiographs: 3 views of skeletally mature adult left foot:No osteomyelitic changes noted.  No cortical destruction noted.  No soft tissue emphysema noted. Assessment:   1. Diabetic ulcer of toe of left foot associated with diabetes mellitus due to underlying condition, limited to breakdown of skin ([place])   2. Type 2 diabetes mellitus with other ophthalmic complication, with long-term current use of insulin ([place])    Plan:  Patient was evaluated and treated and all questions answered.  Ulcer left hallux ulceration limited to the breakdown of the skin -Debridement as below. -Dressed with Betadine wet-to-dry, DSD. -Continue off-loading with surgical shoe.  Procedure: Excisional Debridement of Wound Tool: Sharp chisel blade/tissue nipper Rationale: Removal of non-viable soft tissue from the wound to promote healing.  Anesthesia: none Pre-Debridement Wound Measurements: 0.5 cm x 0.2 cm x 0.2 cm  Post-Debridement Wound Measurements: 0.7 cm x 0.3 cm x 0.2 cm  Type of Debridement: Sharp Excisional Tissue Removed: Non-viable soft tissue Blood loss: Minimal (<50cc) Depth of Debridement: subcutaneous tissue. Technique: Sharp excisional debridement to bleeding, viable wound base.  Wound Progress: This is my  initial evaluation.  I will continue to monitor the progression of it. Site healing conversation 7 Dressing: Dry, sterile, compression dressing. Disposition: Patient tolerated procedure well. Patient to return in 1 week for follow-up.  Return in about 1 week (around 02/01/2021) for diabetic ulcer left great toe, with Dr Posey Pronto.

## 2021-02-01 ENCOUNTER — Other Ambulatory Visit: Payer: Self-pay

## 2021-02-01 ENCOUNTER — Ambulatory Visit: Payer: 59 | Admitting: Podiatry

## 2021-02-01 DIAGNOSIS — Z794 Long term (current) use of insulin: Secondary | ICD-10-CM

## 2021-02-01 DIAGNOSIS — E1139 Type 2 diabetes mellitus with other diabetic ophthalmic complication: Secondary | ICD-10-CM

## 2021-02-01 DIAGNOSIS — E08621 Diabetes mellitus due to underlying condition with foot ulcer: Secondary | ICD-10-CM | POA: Diagnosis not present

## 2021-02-01 DIAGNOSIS — L97521 Non-pressure chronic ulcer of other part of left foot limited to breakdown of skin: Secondary | ICD-10-CM

## 2021-02-02 ENCOUNTER — Encounter: Payer: Self-pay | Admitting: Podiatry

## 2021-02-02 NOTE — Progress Notes (Signed)
Subjective:  Patient ID: Chad Kirk, male    DOB: June 20, 1961,  MRN: [id number]  Chief Complaint  Patient presents with   Diabetic Ulcer    Left toe ulcer     60 y.o. male presents for wound care.  Patient presents with follow-up to left hallux ulceration.  Patient states he is doing a lot better.  The wound is doing okay.  Has been doing and keeping it covered with Betadine wet-to-dry dressing changes.  He denies any other acute complaints.  Review of Systems: Negative except as noted in the HPI. Denies N/V/F/Ch.  Past Medical History:  Diagnosis Date   Diabetes mellitus without complication ([place])    DIAGNOSED AGE 89   Hypertension     Current Outpatient Medications:    Blood Glucose Monitoring Suppl (ACCU-CHEK GUIDE ME) w/Device KIT, 1 Piece by Does not apply route as directed., Disp: 1 kit, Rfl: 0   brimonidine (ALPHAGAN) 0.2 % ophthalmic solution, Place 1 drop into the left eye 3 (three) times daily., Disp: , Rfl:    Carboxymethylcellulose Sodium (LUBRICANT EYE DROPS OP), Apply 1 Container to eye as needed (dry eye). Left eye, Disp: , Rfl:    Continuous Blood Gluc Receiver (FREESTYLE LIBRE 2 READER) DEVI, As directed, Disp: 1 each, Rfl: 0   Continuous Blood Gluc Sensor (FREESTYLE LIBRE 2 SENSOR) MISC, USE ONE SENSOR EVERY 14 DAYS, Disp: 2 each, Rfl: 2   dorzolamide (TRUSOPT) 2 % ophthalmic solution, Place 1 drop into the left eye 3 (three) times daily., Disp: , Rfl:    finasteride (PROSCAR) 5 MG tablet, Take 1 tablet (5 mg total) by mouth daily., Disp: 30 tablet, Rfl: 0   glipiZIDE (GLUCOTROL XL) 5 MG 24 hr tablet, Take 1 tablet (5 mg total) by mouth daily with breakfast., Disp: 90 tablet, Rfl: 0   glucose blood (ACCU-CHEK GUIDE) test strip, Use as instructed, Disp: 150 each, Rfl: 2   insulin glargine (LANTUS) 100 UNIT/ML Solostar Pen, Inject 24 Units into the skin at bedtime., Disp: 15 mL, Rfl: 0   Insulin Pen Needle (B-D ULTRAFINE III SHORT PEN) 31G X 8 MM MISC, 1 each by Does  not apply route as directed., Disp: 100 each, Rfl: 3   losartan (COZAAR) 50 MG tablet, Take 1 tablet (50 mg total) by mouth daily., Disp: 90 tablet, Rfl: 1   moxifloxacin (VIGAMOX) 0.5 % ophthalmic solution, , Disp: , Rfl:    Multiple Vitamins-Minerals (QC DAILY MULTIVIT/MULTIMINERAL PO), Take 1 tablet by mouth daily., Disp: , Rfl:    Probiotic Product (PROBIOTIC PO), Take 1 tablet by mouth daily., Disp: , Rfl:    rosuvastatin (CRESTOR) 10 MG tablet, Take 1 tablet by mouth once daily, Disp: 30 tablet, Rfl: 3   senna-docusate (SENOKOT-S) 8.6-50 MG tablet, Take 1 tablet by mouth as needed. , Disp: , Rfl:    tadalafil (CIALIS) 20 MG tablet, Take 0.5 tablets (10 mg total) by mouth daily as needed for erectile dysfunction., Disp: 5 tablet, Rfl: 3   tamsulosin (FLOMAX) 0.4 MG CAPS capsule, Take 1 capsule by mouth once daily, Disp: 30 capsule, Rfl: 0   timolol (TIMOPTIC) 0.5 % ophthalmic solution, Place 1 drop into both eyes 3 times daily., Disp: , Rfl:    UNABLE TO FIND, Take 5 mLs by mouth every 4 (four) hours as needed. Med Name: Apothecary Cough Syrup Take 5 mL by mouth every 4 to 6 hours as needed for cough, Disp: 120 mL, Rfl: 0  Social History   Tobacco Use  Smoking Status Former   Packs/day: 1.00   Years: 5.00   Pack years: 5.00   Types: Cigarettes   Quit date: 08/20/1988   Years since quitting: 32.4  Smokeless Tobacco Never    No Known Allergies Objective:  There were no vitals filed for this visit. There is no height or weight on file to calculate BMI. Constitutional Well developed. Well nourished.  Vascular Dorsalis pedis pulses faintly palpable bilaterally. Posterior tibial pulses faintly palpable bilaterally. Capillary refill normal to all digits.  No cyanosis or clubbing noted. Pedal hair growth normal.  Neurologic Normal speech. Oriented to person, place, and time. Protective sensation absent  Dermatologic Wound Location: Left plantar hallux limited to the breakdown of the  skin.  No purulent drainage noted no redness noted.  No clinical signs of malodor present. Wound Base: Mixed Granular/Fibrotic Peri-wound: Calloused Exudate: Scant/small amount Serosanguinous exudate Wound Measurements: -See below  Orthopedic: No pain to palpation either foot.   Radiographs: 3 views of skeletally mature adult left foot:No osteomyelitic changes noted.  No cortical destruction noted.  No soft tissue emphysema noted. Assessment:   No diagnosis found.  Plan:  Patient was evaluated and treated and all questions answered.  Ulcer left hallux ulceration limited to the breakdown of the skin -Debridement as below. -Dressed with Betadine wet-to-dry, DSD. -Continue off-loading with surgical shoe.  Procedure: Excisional Debridement of Wound Tool: Sharp chisel blade/tissue nipper Rationale: Removal of non-viable soft tissue from the wound to promote healing.  Anesthesia: none Pre-Debridement Wound Measurements: 0.4 cm x 0.2 cm x 0.2 cm  Post-Debridement Wound Measurements: 0 0.5 cm x 0.2 cm x 0.2 cm Type of Debridement: Sharp Excisional Tissue Removed: Non-viable soft tissue Blood loss: Minimal (<50cc) Depth of Debridement: subcutaneous tissue. Technique: Sharp excisional debridement to bleeding, viable wound base.  Wound Progress: T the wound is decreasing. Dressing: Dry, sterile, compression dressing. Disposition: Patient tolerated procedure well. Patient to return in 1 week for follow-up.  No follow-ups on file.     

## 2021-02-08 ENCOUNTER — Other Ambulatory Visit: Payer: Self-pay

## 2021-02-08 ENCOUNTER — Other Ambulatory Visit: Payer: Self-pay | Admitting: "Endocrinology

## 2021-02-08 ENCOUNTER — Ambulatory Visit: Payer: 59 | Admitting: "Endocrinology

## 2021-02-08 ENCOUNTER — Encounter: Payer: Self-pay | Admitting: "Endocrinology

## 2021-02-08 VITALS — BP 122/80 | HR 84 | Ht 66.0 in | Wt 183.2 lb

## 2021-02-08 DIAGNOSIS — E11311 Type 2 diabetes mellitus with unspecified diabetic retinopathy with macular edema: Secondary | ICD-10-CM | POA: Diagnosis not present

## 2021-02-08 DIAGNOSIS — E782 Mixed hyperlipidemia: Secondary | ICD-10-CM | POA: Diagnosis not present

## 2021-02-08 DIAGNOSIS — E1165 Type 2 diabetes mellitus with hyperglycemia: Secondary | ICD-10-CM | POA: Diagnosis not present

## 2021-02-08 DIAGNOSIS — I10 Essential (primary) hypertension: Secondary | ICD-10-CM

## 2021-02-08 DIAGNOSIS — IMO0002 Reserved for concepts with insufficient information to code with codable children: Secondary | ICD-10-CM

## 2021-02-08 LAB — POCT GLYCOSYLATED HEMOGLOBIN (HGB A1C): HbA1c, POC (controlled diabetic range): 8.9 % — AB (ref 0.0–7.0)

## 2021-02-08 MED ORDER — INSULIN GLARGINE 100 UNIT/ML SOLOSTAR PEN: 30.0000 [IU] | PEN_INJECTOR | Freq: Every day | SUBCUTANEOUS | 1 refills | Status: DC

## 2021-02-08 MED ORDER — FREESTYLE PRECISION NEO TEST VI STRP: ORAL_STRIP | 2 refills | Status: DC

## 2021-02-08 MED ORDER — FREESTYLE TEST VI STRP: ORAL_STRIP | 2 refills | Status: DC

## 2021-02-08 NOTE — Progress Notes (Signed)
c 

## 2021-02-08 NOTE — Patient Instructions (Signed)

## 2021-02-08 NOTE — Progress Notes (Signed)
02/08/2021, 11:52 AM  Endocrinology follow-up note   Subjective:    Patient ID: Chad Kirk, male    DOB: 1961/07/04.  Chad Kirk is being seen in follow-up after he was seen in consultation for management of currently uncontrolled symptomatic diabetes requested by  [name], MD.   Past Medical History:  Diagnosis Date   Diabetes mellitus without complication ([place])    DIAGNOSED AGE 60   Hypertension     Past Surgical History:  Procedure Laterality Date   ESOPHAGEAL DILATION N/A 12/07/2019   Procedure: ESOPHAGEAL OR PYLORIC DILATION;  Surgeon: Danie Binder, MD;  Location: [facility];  Service: Endoscopy;  Laterality: N/A;   ESOPHAGOGASTRODUODENOSCOPY N/A 12/07/2019   erosive gastritis, many non-bleeding cratered and superficial duodenal ulcers without stigmata of bleeding in duodenal bulb. Empiric dilation due to possible occult esophageal web. Negative H.pylori.    EYE SURGERY  03/02/2020   diabetic retinopathy, lens placement, cataract removal, right eye   HERNIA REPAIR Left 1990    Social History   Socioeconomic History   Marital status: Married    Spouse name: Not on file   Number of children: Not on file   Years of education: Not on file   Highest education level: Not on file  Occupational History   Not on file  Tobacco Use   Smoking status: Former    Packs/day: 1.00    Years: 5.00    Pack years: 5.00    Types: Cigarettes    Quit date: 08/20/1988    Years since quitting: 32.4   Smokeless tobacco: Never  Vaping Use   Vaping Use: Never used  Substance and Sexual Activity   Alcohol use: Not Currently    Comment: denied 03/15/20   Drug use: No   Sexual activity: Not on file  Other Topics Concern   Not on file  Social History Narrative   Not on file   Social Determinants of Health   Financial Resource Strain: Not on file  Food Insecurity: Not on file   Transportation Needs: Not on file  Physical Activity: Not on file  Stress: Not on file  Social Connections: Not on file    Family History  Problem Relation Age of Onset   Stroke Mother    Hypertension Mother    Diabetes Mother    Hypertension Father    Diabetes Father    Colon cancer Neg Hx    Colon polyps Neg Hx     Outpatient Encounter Medications as of 02/08/2021  Medication Sig   glucose blood (FREESTYLE TEST STRIPS) test strip Use as instructed   Blood Glucose Monitoring Suppl (ACCU-CHEK GUIDE ME) w/Device KIT 1 Piece by Does not apply route as directed.   Carboxymethylcellulose Sodium (LUBRICANT EYE DROPS OP) Apply 1 Container to eye as needed (dry eye). Left eye   Continuous Blood Gluc Receiver (FREESTYLE LIBRE 2 READER) DEVI As directed   Continuous Blood Gluc Sensor (FREESTYLE LIBRE 2 SENSOR) MISC USE ONE SENSOR EVERY 14 DAYS   dorzolamide (TRUSOPT) 2 % ophthalmic solution Place 1 drop into the left eye 3 (three)  times daily.   finasteride (PROSCAR) 5 MG tablet Take 1 tablet (5 mg total) by mouth daily.   glipiZIDE (GLUCOTROL XL) 5 MG 24 hr tablet Take 1 tablet (5 mg total) by mouth daily with breakfast.   glucose blood (ACCU-CHEK GUIDE) test strip Use as instructed   insulin glargine (LANTUS) 100 UNIT/ML Solostar Pen Inject 30 Units into the skin at bedtime.   Insulin Pen Needle (B-D ULTRAFINE III SHORT PEN) 31G X 8 MM MISC 1 each by Does not apply route as directed.   losartan (COZAAR) 50 MG tablet Take 1 tablet (50 mg total) by mouth daily.   Multiple Vitamins-Minerals (QC DAILY MULTIVIT/MULTIMINERAL PO) Take 1 tablet by mouth daily.   Probiotic Product (PROBIOTIC PO) Take 1 tablet by mouth daily.   rosuvastatin (CRESTOR) 10 MG tablet Take 1 tablet by mouth once daily   senna-docusate (SENOKOT-S) 8.6-50 MG tablet Take 1 tablet by mouth as needed.    tadalafil (CIALIS) 20 MG tablet Take 0.5 tablets (10 mg total) by mouth daily as needed for erectile dysfunction.    tamsulosin (FLOMAX) 0.4 MG CAPS capsule Take 1 capsule by mouth once daily   timolol (TIMOPTIC) 0.5 % ophthalmic solution Place 1 drop into both eyes 3 times daily.   UNABLE TO FIND Take 5 mLs by mouth every 4 (four) hours as needed. Med Name: Apothecary Cough Syrup Take 5 mL by mouth every 4 to 6 hours as needed for cough   [DISCONTINUED] brimonidine (ALPHAGAN) 0.2 % ophthalmic solution Place 1 drop into the left eye 3 (three) times daily.   [DISCONTINUED] insulin glargine (LANTUS) 100 UNIT/ML Solostar Pen Inject 24 Units into the skin at bedtime.   [DISCONTINUED] moxifloxacin (VIGAMOX) 0.5 % ophthalmic solution    No facility-administered encounter medications on file as of 02/08/2021.    ALLERGIES: No Known Allergies  VACCINATION STATUS: Immunization History  Administered Date(s) Administered   PFIZER(Purple Top)SARS-COV-2 Vaccination 10/17/2019, 11/07/2019   Pneumococcal Polysaccharide-23 06/30/2020    Diabetes He presents for his follow-up diabetic visit. He has type 2 diabetes mellitus. Onset time: He was diagnosed at approximate age of 32 years. His disease course has been worsening. Hypoglycemia symptoms include nervousness/anxiousness, sweats and tremors. Pertinent negatives for hypoglycemia include no confusion, headaches, pallor or seizures. Pertinent negatives for diabetes include no blurred vision, no chest pain, no fatigue, no polydipsia, no polyphagia, no polyuria and no weakness. There are no hypoglycemic complications. Symptoms are worsening. Diabetic complications include impotence, nephropathy, peripheral neuropathy, PVD and retinopathy. (He is reporting legal blindness from diabetic retinopathy on both eyes.) Risk factors for coronary artery disease include dyslipidemia, diabetes mellitus, family history, male sex, hypertension and sedentary lifestyle. Current diabetic treatments: He is currently on Lantus 15-20 units 1-2 times a day. His weight is fluctuating minimally. He  is following a generally unhealthy diet. When asked about meal planning, he reported none. His home blood glucose trend is increasing steadily. His breakfast blood glucose range is generally 140-180 mg/dl. His lunch blood glucose range is generally 140-180 mg/dl. His dinner blood glucose range is generally 140-180 mg/dl. His bedtime blood glucose range is generally 140-180 mg/dl. His overall blood glucose range is 140-180 mg/dl. (He recently obtained coverage for a CGM device.  Admittedly, he has not been consistent using his device.  He has a few scanning results AGP shows 56% time range, 44% above range.  No hypoglycemia.  His point-of-care A1c is 8.9%, increasing from 8.5%.   ) He does not see a podiatrist.Eye  exam is current.  Hyperlipidemia This is a chronic problem. The current episode started more than 1 year ago. The problem is uncontrolled. Recent lipid tests were reviewed and are high. Exacerbating diseases include chronic renal disease and diabetes. Pertinent negatives include no chest pain, myalgias or shortness of breath. Current antihyperlipidemic treatment includes statins. Risk factors for coronary artery disease include diabetes mellitus, dyslipidemia, hypertension, male sex, a sedentary lifestyle and family history.  Hypertension This is a chronic problem. The current episode started more than 1 year ago. The problem is controlled. Associated symptoms include sweats. Pertinent negatives include no blurred vision, chest pain, headaches, neck pain, palpitations or shortness of breath. Risk factors for coronary artery disease include dyslipidemia, family history, diabetes mellitus, male gender and sedentary lifestyle. Past treatments include calcium channel blockers. Hypertensive end-organ damage includes kidney disease, PVD and retinopathy. Identifiable causes of hypertension include chronic renal disease.    Review of Systems  Constitutional:  Negative for chills, fatigue, fever and  unexpected weight change.  HENT:  Negative for dental problem, mouth sores and trouble swallowing.   Eyes:  Negative for blurred vision and visual disturbance.  Respiratory:  Negative for cough, choking, chest tightness, shortness of breath and wheezing.   Cardiovascular:  Negative for chest pain, palpitations and leg swelling.  Gastrointestinal:  Negative for abdominal distention, abdominal pain, constipation, diarrhea, nausea and vomiting.  Endocrine: Negative for polydipsia, polyphagia and polyuria.  Genitourinary:  Positive for impotence. Negative for dysuria, flank pain, hematuria and urgency.  Musculoskeletal:  Negative for back pain, gait problem, myalgias and neck pain.  Skin:  Negative for pallor, rash and wound.  Neurological:  Positive for tremors. Negative for seizures, syncope, weakness, numbness and headaches.  Psychiatric/Behavioral:  Negative for confusion and dysphoric mood. The patient is nervous/anxious.    Objective:    Vitals with BMI 02/08/2021 11/07/2020 10/28/2020  Height _0  _1  _2   Weight 183 lbs 3 oz 183 lbs 10 oz 185 lbs  BMI 29.58 93.57 01.77  Systolic 939 030 092  Diastolic 80 75 90  Pulse 84 91 89    BP 122/80   Pulse 84   Ht _3  (1.676 m)   Wt 183 lb 3.2 oz (83.1 kg)   BMI 29.57 kg/m   Wt Readings from Last 3 Encounters:  02/08/21 183 lb 3.2 oz (83.1 kg)  11/07/20 183 lb 9.6 oz (83.3 kg)  10/28/20 185 lb (83.9 kg)     Physical Exam Constitutional:      General: He is not in acute distress.    Appearance: He is well-developed.  HENT:     Head: Normocephalic and atraumatic.  Neck:     Thyroid: No thyromegaly.     Trachea: No tracheal deviation.  Cardiovascular:     Rate and Rhythm: Normal rate.     Pulses:          Dorsalis pedis pulses are 1+ on the right side and 1+ on the left side.       Posterior tibial pulses are 1+ on the right side and 1+ on the left side.     Heart sounds: Normal heart sounds, S1 normal and S2 normal. No  murmur heard.   No gallop.  Pulmonary:     Effort: Pulmonary effort is normal. No respiratory distress.     Breath sounds: No wheezing.  Abdominal:     General: There is no distension.     Tenderness: There is no abdominal tenderness. There  is no guarding.  Musculoskeletal:     Right shoulder: No swelling or deformity.     Cervical back: Normal range of motion and neck supple.     Comments: Signs of poor peripheral circulation.  He is following with podiatry for left foot diabetic foot ulcer.    Skin:    General: Skin is warm and dry.     Findings: No rash.     Nails: There is no clubbing.  Neurological:     Mental Status: He is alert and oriented to person, place, and time.     Cranial Nerves: No cranial nerve deficit.     Sensory: No sensory deficit.     Gait: Gait normal.     Deep Tendon Reflexes: Reflexes are normal and symmetric.  Psychiatric:        Speech: Speech normal.        Behavior: Behavior normal. Behavior is cooperative.        Thought Content: Thought content normal.        Judgment: Judgment normal.    CMP ( most recent) CMP     Component Value Date/Time   NA 140 11/02/2020 0859   K 4.6 11/02/2020 0859   CL 104 11/02/2020 0859   CO2 22 11/02/2020 0859   GLUCOSE 104 (H) 11/02/2020 0859   GLUCOSE 256 (H) 12/30/2019 1218   BUN 17 11/02/2020 0859   CREATININE 1.23 11/02/2020 0859   CALCIUM 9.7 11/02/2020 0859   PROT 7.4 11/02/2020 0859   ALBUMIN 4.5 11/02/2020 0859   AST 30 11/02/2020 0859   ALT 43 11/02/2020 0859   ALKPHOS 81 11/02/2020 0859   BILITOT 0.3 11/02/2020 0859   GFRNONAA >60 12/30/2019 [place] >60 12/30/2019 1218     Diabetic Labs (most recent): Lab Results  Component Value Date   HGBA1C 8.9 (A) 02/08/2021   HGBA1C 8.5 (A) 11/07/2020   HGBA1C 10.0 (A) 05/05/2020     Lipid Panel ( most recent) Lipid Panel     Component Value Date/Time   CHOL 119 11/02/2020 0859   TRIG 82 11/02/2020 0859   HDL 34 (L) 11/02/2020 0859    CHOLHDL 3.5 11/02/2020 0859   CHOLHDL 4.4 12/30/2019 1218   VLDL 15 12/30/2019 1218   LDLCALC 69 11/02/2020 0859   LABVLDL 16 11/02/2020 0859      Assessment & Plan:   1. Type 2 diabetes, uncontrolled, with retinopathy with macular edema (HCC)  - Lyndel Sarate has currently uncontrolled symptomatic type 2 DM since  60 years of age. He recently obtained coverage for a CGM device.  Admittedly, he has not been consistent using his device.  He has a few scanning results AGP shows 56% time range, 44% above range.  No hypoglycemia.  His point-of-care A1c is 8.9%, increasing from 8.5%.    - I had a long discussion with him about the progressive nature of diabetes and the pathology behind its complications. -his diabetes is complicated by retinopathy, peripheral arterial disease, peripheral neuropathy and he remains at a high risk for more acute and chronic complications which include CAD, CVA, CKD, retinopathy, and neuropathy. These are all discussed in detail with him.  - I have counseled him on diet  and weight management  by adopting a carbohydrate restricted/protein rich diet. Patient is encouraged to switch to  unprocessed or minimally processed     complex starch and increased protein intake (animal or plant source), fruits, and vegetables. -  he is advised to  stick to a routine mealtimes to eat 3 meals  a day and avoid unnecessary snacks ( to snack only to correct hypoglycemia).   - he acknowledges that there is a room for improvement in his food and drink choices. - Suggestion is made for him to avoid simple carbohydrates  from his diet including Cakes, Sweet Desserts, Ice Cream, Soda (diet and regular), Sweet Tea, Candies, Chips, Cookies, Store Bought Juices, Alcohol in Excess of  1-2 drinks a day, Artificial Sweeteners,  Coffee Creamer, and "Sugar-free" Products, Lemonade. This will help patient to have more stable blood glucose profile and potentially avoid unintended weight gain.   - he  will be scheduled with Jearld Fenton, RDN, CDE for diabetes education.  - I have approached him with the following individualized plan to manage  his diabetes and patient agrees:   -In light of his presentation with significantly above target glycemic profile, he will need higher dose of insulin in order for him to achieve control of diabetes to target.  He will not need prandial insulin for now.   -Urging him to use his CGM at all times, and eat at least 3 meals a day, he is advised to increase Lantus to 30 units nightly,  associated with monitoring of blood glucose strictly  4 times a day-before meals and at bedtime. - he is encouraged to call clinic for blood glucose levels less than 70 or above 200 mg /dl.  - he is warned not to take insulin without proper monitoring per orders.   -He has responded and benefited from low-dose glipizide.  He is advised to continue glipizide 5 mg XL p.o. daily at breakfast.     - Specific targets for  A1c;  LDL, HDL,  and Triglycerides were discussed with the patient.  2) Blood Pressure /Hypertension:  His blood pressure is controlled to target. he is advised to continue his current medications including amlodipine 10 mg p.o.. daily with breakfast . 3) Lipids/Hyperlipidemia:   Review of his recent lipid panel showed un controlled  LDL at 149.  he  is not on a statin intervention.  He is advised to continue Crestor 10 mg p.o. nightly.  Side effects and precautions discussed with him.     4)  Weight/Diet:  Body mass index is 29.57 kg/m.  -   he is not a candidate for weight loss. Exercise, and detailed carbohydrates information provided  -  detailed on discharge instructions.  5) Chronic Care/Health Maintenance:  -he  is on  Statin medications and  is encouraged to initiate and continue to follow up with Ophthalmology, Dentist,  Podiatrist at least yearly or according to recommendations, and advised to   stay away from smoking. I have recommended yearly  flu vaccine and pneumonia vaccine at least every 5 years; moderate intensity exercise for up to 150 minutes weekly; and  sleep for at least 7 hours a day.  His screening ABI was normal in September 2021.  He has left lower extremity diabetic foot ulcer, under care with podiatry.    His next study is due in September 2027, or sooner if needed.  - he is  advised to maintain close follow up with his pcp for primary care needs, as well as his other providers for optimal and coordinated care.    I spent 41 minutes in the care of the patient today including review of labs from [facility], Lipids, Thyroid Function, Hematology (current and previous including abstractions from other facilities); face-to-face time  discussing  his blood glucose readings/logs, discussing hypoglycemia and hyperglycemia episodes and symptoms, medications doses, his options of short and long term treatment based on the latest standards of care / guidelines;  discussion about incorporating lifestyle medicine;  and documenting the encounter.    Please refer to Patient Instructions for Blood Glucose Monitoring and Insulin/Medications Dosing Guide"  in media tab for additional information. Please  also refer to " Patient Self Inventory" in the Media  tab for reviewed elements of pertinent patient history.  Bradd Canary participated in the discussions, expressed understanding, and voiced agreement with the above plans.  All questions were answered to his satisfaction. he is encouraged to contact clinic should he have any questions or concerns prior to his return visit.    Follow up plan: - Return in about 3 months (around 05/11/2021) for F/U with Pre-visit Labs, Meter, Logs, A1c here.Glade Lloyd, MD [facility] [address] [place], [place] [address] Phone: [phone]  Fax: [phone]    02/08/2021, 11:52 AM  This note was partially dictated with voice recognition software.  Similar sounding words can be transcribed inadequately or may not  be corrected upon review.

## 2021-02-09 ENCOUNTER — Other Ambulatory Visit: Payer: Self-pay

## 2021-02-09 DIAGNOSIS — IMO0002 Reserved for concepts with insufficient information to code with codable children: Secondary | ICD-10-CM

## 2021-02-09 DIAGNOSIS — E1165 Type 2 diabetes mellitus with hyperglycemia: Secondary | ICD-10-CM

## 2021-02-09 MED ORDER — BLOOD GLUCOSE MONITOR KIT: 1.0000 | PACK | Freq: Four times a day (QID) | 0 refills | Status: DC

## 2021-02-22 ENCOUNTER — Ambulatory Visit: Payer: 59 | Admitting: Podiatry

## 2021-02-22 ENCOUNTER — Other Ambulatory Visit: Payer: Self-pay

## 2021-02-22 ENCOUNTER — Encounter: Payer: Self-pay | Admitting: Podiatry

## 2021-02-22 ENCOUNTER — Ambulatory Visit (INDEPENDENT_AMBULATORY_CARE_PROVIDER_SITE_OTHER): Payer: 59 | Admitting: Podiatry

## 2021-02-22 DIAGNOSIS — E08621 Diabetes mellitus due to underlying condition with foot ulcer: Secondary | ICD-10-CM

## 2021-02-22 DIAGNOSIS — E1139 Type 2 diabetes mellitus with other diabetic ophthalmic complication: Secondary | ICD-10-CM | POA: Diagnosis not present

## 2021-02-22 DIAGNOSIS — L97521 Non-pressure chronic ulcer of other part of left foot limited to breakdown of skin: Secondary | ICD-10-CM | POA: Diagnosis not present

## 2021-02-22 DIAGNOSIS — M2012 Hallux valgus (acquired), left foot: Secondary | ICD-10-CM

## 2021-02-22 DIAGNOSIS — Z8631 Personal history of diabetic foot ulcer: Secondary | ICD-10-CM

## 2021-02-22 DIAGNOSIS — M2011 Hallux valgus (acquired), right foot: Secondary | ICD-10-CM

## 2021-02-22 DIAGNOSIS — M2042 Other hammer toe(s) (acquired), left foot: Secondary | ICD-10-CM

## 2021-02-22 DIAGNOSIS — Z794 Long term (current) use of insulin: Secondary | ICD-10-CM

## 2021-02-22 DIAGNOSIS — M2041 Other hammer toe(s) (acquired), right foot: Secondary | ICD-10-CM

## 2021-02-22 HISTORY — DX: Personal history of diabetic foot ulcer: Z86.31

## 2021-02-22 NOTE — Progress Notes (Signed)
Patient presented for foam casting for 3 pair custom diabetic shoe inserts. Patient is measured with a brannock device to be a size 10 1/2 wide  Diabetic shoes are chosen from the safe step catalog.  The shoes chosen are: First choice is [id number]                                       Second choice is [id number]  The patient will be contacted when the shoes and inserts are ready to be picked up

## 2021-02-23 ENCOUNTER — Encounter: Payer: Self-pay | Admitting: Podiatry

## 2021-02-23 NOTE — Progress Notes (Signed)
Subjective:  Patient ID: Chad Kirk, male    DOB: [birth year],  MRN: [id number]  Chief Complaint  Patient presents with   Wound Check    F/U Lt hallux wound check -pt states," I think looks good." -no pain/redness/swelling/drainage -pt deneis N/V/F/Ch -FBS: 170 a1C: 7     60 y.o. male presents for wound care.  Patient presents with follow-up to left hallux ulceration.  Patient states is doing a lot better.  Is been keeping covered with Betadine wet-to-dry dressing changes.  He denies any other acute complaints  Review of Systems: Negative except as noted in the HPI. Denies N/V/F/Ch.  Past Medical History:  Diagnosis Date   Diabetes mellitus without complication ([place])    DIAGNOSED AGE 34   Hypertension     Current Outpatient Medications:    blood glucose meter kit and supplies KIT, 1 each by Does not apply route 4 (four) times daily. Dispense based on patient and insurance preference. Use up to four times daily as directed., Disp: 1 each, Rfl: 0   Blood Glucose Monitoring Suppl (ACCU-CHEK GUIDE ME) w/Device KIT, 1 Piece by Does not apply route as directed., Disp: 1 kit, Rfl: 0   Carboxymethylcellulose Sodium (LUBRICANT EYE DROPS OP), Apply 1 Container to eye as needed (dry eye). Left eye, Disp: , Rfl:    Continuous Blood Gluc Receiver (FREESTYLE LIBRE 2 READER) DEVI, As directed, Disp: 1 each, Rfl: 0   Continuous Blood Gluc Sensor (FREESTYLE LIBRE 2 SENSOR) MISC, USE ONE SENSOR EVERY 14 DAYS, Disp: 2 each, Rfl: 2   dorzolamide (TRUSOPT) 2 % ophthalmic solution, Place 1 drop into the left eye 3 (three) times daily., Disp: , Rfl:    finasteride (PROSCAR) 5 MG tablet, Take 1 tablet (5 mg total) by mouth daily., Disp: 30 tablet, Rfl: 0   glipiZIDE (GLUCOTROL XL) 5 MG 24 hr tablet, Take 1 tablet (5 mg total) by mouth daily with breakfast., Disp: 90 tablet, Rfl: 0   glucose blood (FREESTYLE PRECISION NEO TEST) test strip, Use as instructed, Disp: 100 each, Rfl: 2   insulin glargine (LANTUS)  100 UNIT/ML Solostar Pen, Inject 30 Units into the skin at bedtime., Disp: 15 mL, Rfl: 1   Insulin Pen Needle (B-D ULTRAFINE III SHORT PEN) 31G X 8 MM MISC, 1 each by Does not apply route as directed., Disp: 100 each, Rfl: 3   losartan (COZAAR) 50 MG tablet, Take 1 tablet (50 mg total) by mouth daily., Disp: 90 tablet, Rfl: 1   Multiple Vitamins-Minerals (QC DAILY MULTIVIT/MULTIMINERAL PO), Take 1 tablet by mouth daily., Disp: , Rfl:    Probiotic Product (PROBIOTIC PO), Take 1 tablet by mouth daily., Disp: , Rfl:    rosuvastatin (CRESTOR) 10 MG tablet, Take 1 tablet by mouth once daily, Disp: 30 tablet, Rfl: 3   senna-docusate (SENOKOT-S) 8.6-50 MG tablet, Take 1 tablet by mouth as needed. , Disp: , Rfl:    tadalafil (CIALIS) 20 MG tablet, Take 0.5 tablets (10 mg total) by mouth daily as needed for erectile dysfunction., Disp: 5 tablet, Rfl: 3   tamsulosin (FLOMAX) 0.4 MG CAPS capsule, Take 1 capsule by mouth once daily, Disp: 30 capsule, Rfl: 0   timolol (TIMOPTIC) 0.5 % ophthalmic solution, Place 1 drop into both eyes 3 times daily., Disp: , Rfl:    UNABLE TO FIND, Take 5 mLs by mouth every 4 (four) hours as needed. Med Name: Apothecary Cough Syrup Take 5 mL by mouth every 4 to 6 hours as needed for  cough, Disp: 120 mL, Rfl: 0  Social History   Tobacco Use  Smoking Status Former   Packs/day: 1.00   Years: 5.00   Pack years: 5.00   Types: Cigarettes   Quit date: 08/20/1988   Years since quitting: 32.5  Smokeless Tobacco Never    No Known Allergies Objective:  There were no vitals filed for this visit. There is no height or weight on file to calculate BMI. Constitutional Well developed. Well nourished.  Vascular Dorsalis pedis pulses faintly palpable bilaterally. Posterior tibial pulses faintly palpable bilaterally. Capillary refill normal to all digits.  No cyanosis or clubbing noted. Pedal hair growth normal.  Neurologic Normal speech. Oriented to person, place, and  time. Protective sensation absent  Dermatologic Left hallux wound completely epithelialized.  No further concern for wound.  No clinical signs of infection noted.  Orthopedic: No pain to palpation either foot.   Radiographs: 3 views of skeletally mature adult left foot:No osteomyelitic changes noted.  No cortical destruction noted.  No soft tissue emphysema noted. Assessment:   1. Diabetic ulcer of toe of left foot associated with diabetes mellitus due to underlying condition, limited to breakdown of skin ([place])   2. Type 2 diabetes mellitus with other ophthalmic complication, with long-term current use of insulin ([place])     Plan:  Patient was evaluated and treated and all questions answered.  Ulcer left hallux ulceration limited to the breakdown of the skin -Clinically healed.  At this time there is no further concern for wound breakdown.  The skin has completely epithelialized.  I discussed with him the benefit of obtaining diabetic shoes/insoles.  He will be scheduled to get the offloading with diabetic shoes.  Patient states understanding.  No follow-ups on file.

## 2021-02-27 ENCOUNTER — Other Ambulatory Visit: Payer: Self-pay | Admitting: "Endocrinology

## 2021-02-27 ENCOUNTER — Other Ambulatory Visit: Payer: Self-pay | Admitting: Internal Medicine

## 2021-02-27 ENCOUNTER — Ambulatory Visit: Payer: 59 | Admitting: Internal Medicine

## 2021-02-27 DIAGNOSIS — IMO0002 Reserved for concepts with insufficient information to code with codable children: Secondary | ICD-10-CM

## 2021-02-27 DIAGNOSIS — N400 Enlarged prostate without lower urinary tract symptoms: Secondary | ICD-10-CM

## 2021-02-27 DIAGNOSIS — E11311 Type 2 diabetes mellitus with unspecified diabetic retinopathy with macular edema: Secondary | ICD-10-CM

## 2021-03-10 ENCOUNTER — Other Ambulatory Visit: Payer: Self-pay | Admitting: Internal Medicine

## 2021-03-10 DIAGNOSIS — N529 Male erectile dysfunction, unspecified: Secondary | ICD-10-CM

## 2021-03-27 ENCOUNTER — Other Ambulatory Visit: Payer: Self-pay

## 2021-03-27 ENCOUNTER — Ambulatory Visit: Payer: 59 | Admitting: Internal Medicine

## 2021-03-29 ENCOUNTER — Telehealth: Payer: Self-pay

## 2021-03-29 NOTE — Telephone Encounter (Signed)
Discussed with pt, understanding voiced. 

## 2021-03-29 NOTE — Telephone Encounter (Signed)
Readings: All of these are at 5 am   Saturday- 55  Sunday- 100 Monday- 135 Tuesday- 56 Wednesday- 55

## 2021-04-11 ENCOUNTER — Other Ambulatory Visit: Payer: Self-pay | Admitting: Internal Medicine

## 2021-04-11 ENCOUNTER — Encounter: Payer: Self-pay | Admitting: Internal Medicine

## 2021-04-11 ENCOUNTER — Other Ambulatory Visit: Payer: Self-pay | Admitting: "Endocrinology

## 2021-04-11 ENCOUNTER — Other Ambulatory Visit: Payer: Self-pay

## 2021-04-11 ENCOUNTER — Ambulatory Visit: Payer: 59 | Admitting: Internal Medicine

## 2021-04-11 VITALS — BP 104/72 | HR 88 | Temp 98.3°F | Resp 20 | Ht 70.0 in | Wt 186.0 lb

## 2021-04-11 DIAGNOSIS — E11311 Type 2 diabetes mellitus with unspecified diabetic retinopathy with macular edema: Secondary | ICD-10-CM

## 2021-04-11 DIAGNOSIS — N400 Enlarged prostate without lower urinary tract symptoms: Secondary | ICD-10-CM

## 2021-04-11 DIAGNOSIS — IMO0002 Reserved for concepts with insufficient information to code with codable children: Secondary | ICD-10-CM

## 2021-04-11 DIAGNOSIS — N529 Male erectile dysfunction, unspecified: Secondary | ICD-10-CM | POA: Diagnosis not present

## 2021-04-11 DIAGNOSIS — Z1211 Encounter for screening for malignant neoplasm of colon: Secondary | ICD-10-CM

## 2021-04-11 DIAGNOSIS — E1165 Type 2 diabetes mellitus with hyperglycemia: Secondary | ICD-10-CM

## 2021-04-11 DIAGNOSIS — I10 Essential (primary) hypertension: Secondary | ICD-10-CM

## 2021-04-11 MED ORDER — TADALAFIL 20 MG PO TABS: 20.0000 mg | ORAL_TABLET | Freq: Every day | ORAL | 1 refills | Status: DC | PRN

## 2021-04-11 NOTE — Patient Instructions (Signed)
Please continue taking medications as prescribed.  Continue to follow up with Dr [name] for diabetes management.  Continue to follow low carb diet and ambulate as tolerated.

## 2021-04-13 NOTE — Assessment & Plan Note (Signed)
Well-controlled with Flomax and Finasteride °

## 2021-04-13 NOTE — Progress Notes (Signed)
Established Patient Office Visit  Subjective:  Patient ID: Chad Kirk, male    DOB: April 07, 1961  Age: 60 y.o. MRN: [id number]  CC:  Chief Complaint  Patient presents with   Hypertension    HPI Chad Kirk is a 60 year old male with past medical history of uncontrolled diabetes mellitus type 2, PVD, hypertension, hyperlipidemia, diabetic macular edema, BPH and alcohol abuse who presents for follow up of his chronic medical conditions.  BP is well-controlled. Takes medications regularly. Patient denies headache, dizziness, chest pain, dyspnea or palpitations.  DM: He follows up with Dr. Dorris Fetch for diabetes management.  He takes Lantus regularly.  He is advised to follow diabetic diet as instructed. He follows up with ophthalmologist for diabetic macular edema and his vision has improved now. Denies any polyuria, polyphagia or fatigue.  Erectile dysfunction: He admits taking 2 tablets of Cialis 10 mg for it. He states that he gets adequate response with 20 mg.  He denies for colonoscopy, but agrees to get Cologuard. Does not report personal history of colonic polyp or family h/o colon cancer.   Past Medical History:  Diagnosis Date   Diabetes mellitus without complication ([place])    DIAGNOSED AGE 26   Hypertension     Past Surgical History:  Procedure Laterality Date   ESOPHAGEAL DILATION N/A 12/07/2019   Procedure: ESOPHAGEAL OR PYLORIC DILATION;  Surgeon: Danie Binder, MD;  Location: [facility];  Service: Endoscopy;  Laterality: N/A;   ESOPHAGOGASTRODUODENOSCOPY N/A 12/07/2019   erosive gastritis, many non-bleeding cratered and superficial duodenal ulcers without stigmata of bleeding in duodenal bulb. Empiric dilation due to possible occult esophageal web. Negative H.pylori.    EYE SURGERY  03/02/2020   diabetic retinopathy, lens placement, cataract removal, right eye   HERNIA REPAIR Left 1990    Family History  Problem Relation Age of Onset   Stroke Mother     Hypertension Mother    Diabetes Mother    Hypertension Father    Diabetes Father    Colon cancer Neg Hx    Colon polyps Neg Hx     Social History   Socioeconomic History   Marital status: Married    Spouse name: Not on file   Number of children: Not on file   Years of education: Not on file   Highest education level: Not on file  Occupational History   Not on file  Tobacco Use   Smoking status: Former    Packs/day: 1.00    Years: 5.00    Pack years: 5.00    Types: Cigarettes    Quit date: 08/20/1988    Years since quitting: 32.6   Smokeless tobacco: Never  Vaping Use   Vaping Use: Never used  Substance and Sexual Activity   Alcohol use: Not Currently    Comment: denied 03/15/20   Drug use: No   Sexual activity: Not on file  Other Topics Concern   Not on file  Social History Narrative   Not on file   Social Determinants of Health   Financial Resource Strain: Not on file  Food Insecurity: Not on file  Transportation Needs: Not on file  Physical Activity: Not on file  Stress: Not on file  Social Connections: Not on file  Intimate Partner Violence: Not on file    Outpatient Medications Prior to Visit  Medication Sig Dispense Refill   blood glucose meter kit and supplies KIT 1 each by Does not apply route 4 (four) times daily. Dispense based  on patient and insurance preference. Use up to four times daily as directed. 1 each 0   Blood Glucose Monitoring Suppl (ACCU-CHEK GUIDE ME) w/Device KIT 1 Piece by Does not apply route as directed. 1 kit 0   Carboxymethylcellulose Sodium (LUBRICANT EYE DROPS OP) Apply 1 Container to eye as needed (dry eye). Left eye     Continuous Blood Gluc Receiver (FREESTYLE LIBRE 2 READER) DEVI As directed 1 each 0   Continuous Blood Gluc Sensor (FREESTYLE LIBRE 2 SENSOR) MISC USE AS DIRECTED TO  CHECK  BLOOD  GLUCOSE  -  CHANGE  SENSOR  EVERY  14  DAYS (PATIENT NEEDS APPOINTMENT FOR FURTHER REFILLS) 2 each 2   dorzolamide (TRUSOPT) 2 %  ophthalmic solution Place 1 drop into the left eye 3 (three) times daily.     glipiZIDE (GLUCOTROL XL) 5 MG 24 hr tablet Take 1 tablet (5 mg total) by mouth daily with breakfast. 90 tablet 0   glucose blood (FREESTYLE PRECISION NEO TEST) test strip Use as instructed 100 each 2   Insulin Pen Needle (B-D ULTRAFINE III SHORT PEN) 31G X 8 MM MISC 1 each by Does not apply route as directed. 100 each 3   LANTUS SOLOSTAR 100 UNIT/ML Solostar Pen Inject 30 Units into the skin at bedtime. 15 mL 2   losartan (COZAAR) 50 MG tablet Take 1 tablet (50 mg total) by mouth daily. 90 tablet 1   Multiple Vitamins-Minerals (QC DAILY MULTIVIT/MULTIMINERAL PO) Take 1 tablet by mouth daily.     Probiotic Product (PROBIOTIC PO) Take 1 tablet by mouth daily.     senna-docusate (SENOKOT-S) 8.6-50 MG tablet Take 1 tablet by mouth as needed.      tamsulosin (FLOMAX) 0.4 MG CAPS capsule Take 1 capsule by mouth once daily 30 capsule 0   timolol (TIMOPTIC) 0.5 % ophthalmic solution Place 1 drop into both eyes 3 times daily.     UNABLE TO FIND Take 5 mLs by mouth every 4 (four) hours as needed. Med Name: Apothecary Cough Syrup Take 5 mL by mouth every 4 to 6 hours as needed for cough 120 mL 0   finasteride (PROSCAR) 5 MG tablet Take 1 tablet by mouth once daily 30 tablet 0   rosuvastatin (CRESTOR) 10 MG tablet Take 1 tablet by mouth once daily 30 tablet 3   tadalafil (CIALIS) 20 MG tablet TAKE 1/2 (ONE-HALF) TABLET BY MOUTH ONCE DAILY AS NEEDED FOR ERECTILE DYSFUNCTION 5 tablet 0   No facility-administered medications prior to visit.    No Known Allergies  ROS Review of Systems  Constitutional:  Negative for chills and fever.  HENT:  Negative for congestion and sore throat.   Eyes:  Positive for visual disturbance. Negative for pain and discharge.  Respiratory:  Negative for cough and shortness of breath.   Cardiovascular:  Negative for chest pain and palpitations.  Gastrointestinal:  Negative for constipation,  diarrhea, nausea and vomiting.  Endocrine: Negative for polydipsia and polyuria.  Genitourinary:  Negative for dysuria and hematuria.  Musculoskeletal:  Negative for neck pain and neck stiffness.  Skin:  Negative for rash.  Neurological:  Negative for dizziness, weakness, numbness and headaches.  Psychiatric/Behavioral:  Negative for agitation and behavioral problems.      Objective:    Physical Exam Vitals reviewed.  Constitutional:      General: He is not in acute distress.    Appearance: He is not diaphoretic.  HENT:     Head: Normocephalic and atraumatic.  Nose: Nose normal.     Mouth/Throat:     Mouth: Mucous membranes are moist.  Eyes:     General: No scleral icterus.    Extraocular Movements: Extraocular movements intact.     Pupils: Pupils are equal, round, and reactive to light.  Cardiovascular:     Rate and Rhythm: Normal rate and regular rhythm.     Heart sounds: Normal heart sounds. No murmur heard.    Comments: DPA pulse 1+ b/l Pulmonary:     Breath sounds: Normal breath sounds. No wheezing or rales.  Abdominal:     Palpations: Abdomen is soft.     Tenderness: There is no abdominal tenderness.  Musculoskeletal:     Cervical back: Neck supple. No tenderness.     Right lower leg: No edema.     Left lower leg: No edema.  Skin:    General: Skin is warm.     Findings: No rash.     Comments: No ulcer or skin breakdown on feet  Neurological:     General: No focal deficit present.     Mental Status: He is alert and oriented to person, place, and time.     Sensory: No sensory deficit.     Motor: No weakness.  Psychiatric:        Mood and Affect: Mood normal.        Behavior: Behavior normal.    BP 104/72 (BP Location: Right Arm, Patient Position: Sitting, Cuff Size: Large)   Pulse 88   Temp 98.3 F (36.8 C)   Resp 20   Ht '5\' 10"'  (1.778 m)   Wt 186 lb (84.4 kg)   SpO2 93%   BMI 26.69 kg/m  Wt Readings from Last 3 Encounters:  04/11/21 186 lb (84.4  kg)  02/08/21 183 lb 3.2 oz (83.1 kg)  11/07/20 183 lb 9.6 oz (83.3 kg)     Health Maintenance Due  Topic Date Due   Hepatitis C Screening  Never done   COLONOSCOPY (Pts 45-26yr Insurance coverage will need to be confirmed)  Never done   Zoster Vaccines- Shingrix (1 of 2) Never done   COVID-19 Vaccine (3 - Booster for Pfizer series) 04/08/2020   FOOT EXAM  12/29/2020   INFLUENZA VACCINE  03/20/2021   Pneumococcal Vaccine [id number] old (2 - PCV) 06/30/2021    There are no preventive care reminders to display for this patient.  Lab Results  Component Value Date   TSH 1.920 11/02/2020   Lab Results  Component Value Date   WBC 5.2 12/08/2019   HGB 13.0 12/08/2019   HCT 41.3 12/08/2019   MCV 86.2 12/08/2019   PLT 193 12/08/2019   Lab Results  Component Value Date   NA 140 11/02/2020   K 4.6 11/02/2020   CO2 22 11/02/2020   GLUCOSE 104 (H) 11/02/2020   BUN 17 11/02/2020   CREATININE 1.23 11/02/2020   BILITOT 0.3 11/02/2020   ALKPHOS 81 11/02/2020   AST 30 11/02/2020   ALT 43 11/02/2020   PROT 7.4 11/02/2020   ALBUMIN 4.5 11/02/2020   CALCIUM 9.7 11/02/2020   ANIONGAP 8 12/30/2019   EGFR 67 11/02/2020   Lab Results  Component Value Date   CHOL 119 11/02/2020   Lab Results  Component Value Date   HDL 34 (L) 11/02/2020   Lab Results  Component Value Date   LDLCALC 69 11/02/2020   Lab Results  Component Value Date   TRIG 82 11/02/2020   Lab  Results  Component Value Date   CHOLHDL 3.5 11/02/2020   Lab Results  Component Value Date   HGBA1C 8.9 (A) 02/08/2021      Assessment & Plan:   Problem List Items Addressed This Visit       Cardiovascular and Mediastinum   Essential hypertension, benign    BP Readings from Last 1 Encounters:  04/11/21 104/72  uncontrolled due to noncompliance Refilled Losartan 50 mg QD Counseled for compliance with the medications Advised DASH diet and moderate exercise/walking, at least 150 mins/week      Relevant  Medications   tadalafil (CIALIS) 20 MG tablet     Endocrine   Type 2 diabetes, uncontrolled, with retinopathy with macular edema (HCC) - Primary    Lab Results  Component Value Date   HGBA1C 8.9 (A) 02/08/2021  On Lantus 30 U qHS and Glipizide 5 mg QD Advised to schedule appointment with Dr Dorris Fetch Advised to follow diabetic diet On statin F/u CMP and lipid panel in the next visit Diabetic eye exam: Advised to continue to follow up with Ophthalmology for diabetic eye exam         Genitourinary   BPH (benign prostatic hyperplasia)    Well-controlled with Flomax and Finasteride        Other   Erectile dysfunction    Takes Cialis, admits that he has to take 2 tablets sometimes Increased dose to 20 mg QD PRN      Relevant Medications   tadalafil (CIALIS) 20 MG tablet   Other Visit Diagnoses     Screen for colon cancer       Relevant Orders   Cologuard       Meds ordered this encounter  Medications   tadalafil (CIALIS) 20 MG tablet    Sig: Take 1 tablet (20 mg total) by mouth daily as needed for erectile dysfunction.    Dispense:  30 tablet    Refill:  1    Follow-up: Return in about 4 months (around 08/11/2021) for Annual physical.    [name], MD

## 2021-04-13 NOTE — Assessment & Plan Note (Signed)
BP Readings from Last 1 Encounters:  04/11/21 104/72   uncontrolled due to noncompliance Refilled Losartan 50 mg QD Counseled for compliance with the medications Advised DASH diet and moderate exercise/walking, at least 150 mins/week

## 2021-04-13 NOTE — Assessment & Plan Note (Signed)
Takes Cialis, admits that he has to take 2 tablets sometimes Increased dose to 20 mg QD PRN

## 2021-04-13 NOTE — Assessment & Plan Note (Signed)
Lab Results  Component Value Date   HGBA1C 8.9 (A) 02/08/2021   On Lantus 30 U qHS and Glipizide 5 mg QD Advised to schedule appointment with Dr Emeline Darling to follow diabetic diet On statin F/u CMP and lipid panel in the next visit Diabetic eye exam: Advised to continue to follow up with Ophthalmology for diabetic eye exam

## 2021-04-14 ENCOUNTER — Other Ambulatory Visit: Payer: Self-pay

## 2021-04-14 ENCOUNTER — Other Ambulatory Visit: Payer: Self-pay | Admitting: Internal Medicine

## 2021-04-14 ENCOUNTER — Ambulatory Visit (INDEPENDENT_AMBULATORY_CARE_PROVIDER_SITE_OTHER): Payer: 59

## 2021-04-14 DIAGNOSIS — E08621 Diabetes mellitus due to underlying condition with foot ulcer: Secondary | ICD-10-CM

## 2021-04-14 DIAGNOSIS — E1142 Type 2 diabetes mellitus with diabetic polyneuropathy: Secondary | ICD-10-CM | POA: Diagnosis not present

## 2021-04-14 DIAGNOSIS — L97521 Non-pressure chronic ulcer of other part of left foot limited to breakdown of skin: Secondary | ICD-10-CM

## 2021-04-14 DIAGNOSIS — I10 Essential (primary) hypertension: Secondary | ICD-10-CM

## 2021-04-14 NOTE — Progress Notes (Signed)
Patient in office today to pick-up diabetic shoes. Shoes were tried on with custom inserts and patient was satisfied with the fit and feel of the shoes. Patient was educated on the break-in process and return policy. Patient verbalized understanding. Advised patient to call the office with any questions, comments or concerns.  

## 2021-05-03 LAB — COMPREHENSIVE METABOLIC PANEL
ALT: 38 IU/L (ref 0–44)
AST: 25 IU/L (ref 0–40)
Albumin/Globulin Ratio: 1.5 (ref 1.2–2.2)
Albumin: 4.1 g/dL (ref 3.8–4.8)
Alkaline Phosphatase: 81 IU/L (ref 44–121)
BUN/Creatinine Ratio: 16 (ref 10–24)
BUN: 15 mg/dL (ref 8–27)
Bilirubin Total: 0.4 mg/dL (ref 0.0–1.2)
CO2: 26 mmol/L (ref 20–29)
Calcium: 9.2 mg/dL (ref 8.6–10.2)
Chloride: 104 mmol/L (ref 96–106)
Creatinine, Ser: 0.96 mg/dL (ref 0.76–1.27)
Globulin, Total: 2.7 g/dL (ref 1.5–4.5)
Glucose: 101 mg/dL — ABNORMAL HIGH (ref 65–99)
Potassium: 4.6 mmol/L (ref 3.5–5.2)
Sodium: 140 mmol/L (ref 134–144)
Total Protein: 6.8 g/dL (ref 6.0–8.5)
eGFR: 90 mL/min/{1.73_m2} (ref >59)

## 2021-05-05 ENCOUNTER — Other Ambulatory Visit: Payer: Self-pay

## 2021-05-05 ENCOUNTER — Ambulatory Visit (INDEPENDENT_AMBULATORY_CARE_PROVIDER_SITE_OTHER): Payer: 59 | Admitting: Podiatry

## 2021-05-05 ENCOUNTER — Encounter: Payer: Self-pay | Admitting: Podiatry

## 2021-05-05 DIAGNOSIS — B351 Tinea unguium: Secondary | ICD-10-CM

## 2021-05-05 DIAGNOSIS — E1142 Type 2 diabetes mellitus with diabetic polyneuropathy: Secondary | ICD-10-CM

## 2021-05-05 DIAGNOSIS — M79675 Pain in left toe(s): Secondary | ICD-10-CM | POA: Diagnosis not present

## 2021-05-05 DIAGNOSIS — M79674 Pain in right toe(s): Secondary | ICD-10-CM

## 2021-05-05 DIAGNOSIS — Z794 Long term (current) use of insulin: Secondary | ICD-10-CM

## 2021-05-05 DIAGNOSIS — L84 Corns and callosities: Secondary | ICD-10-CM

## 2021-05-07 LAB — COLOGUARD: Cologuard: NEGATIVE

## 2021-05-09 ENCOUNTER — Other Ambulatory Visit: Payer: Self-pay | Admitting: "Endocrinology

## 2021-05-10 ENCOUNTER — Other Ambulatory Visit: Payer: Self-pay | Admitting: "Endocrinology

## 2021-05-10 NOTE — Telephone Encounter (Signed)
Are you filling the Amlodipine or is Dr. Allena Katz?  Last OV 02/08/2021  Next OV 05/11/2021 (Tomorrow)  Last filled 12/31/2020 by historical provider.

## 2021-05-11 ENCOUNTER — Ambulatory Visit: Payer: 59 | Admitting: "Endocrinology

## 2021-05-11 ENCOUNTER — Encounter: Payer: Self-pay | Admitting: "Endocrinology

## 2021-05-11 ENCOUNTER — Other Ambulatory Visit: Payer: Self-pay

## 2021-05-11 VITALS — BP 148/86 | HR 80 | Ht 70.0 in | Wt 183.8 lb

## 2021-05-11 DIAGNOSIS — IMO0002 Reserved for concepts with insufficient information to code with codable children: Secondary | ICD-10-CM

## 2021-05-11 DIAGNOSIS — E782 Mixed hyperlipidemia: Secondary | ICD-10-CM | POA: Diagnosis not present

## 2021-05-11 DIAGNOSIS — E11311 Type 2 diabetes mellitus with unspecified diabetic retinopathy with macular edema: Secondary | ICD-10-CM | POA: Diagnosis not present

## 2021-05-11 DIAGNOSIS — I10 Essential (primary) hypertension: Secondary | ICD-10-CM | POA: Diagnosis not present

## 2021-05-11 DIAGNOSIS — E1165 Type 2 diabetes mellitus with hyperglycemia: Secondary | ICD-10-CM | POA: Diagnosis not present

## 2021-05-11 LAB — POCT GLYCOSYLATED HEMOGLOBIN (HGB A1C): HbA1c, POC (controlled diabetic range): 8 % — AB (ref 0.0–7.0)

## 2021-05-11 MED ORDER — ONETOUCH VERIO VI STRP: ORAL_STRIP | 2 refills | Status: AC

## 2021-05-11 MED ORDER — LANTUS SOLOSTAR 100 UNIT/ML ~~LOC~~ SOPN: 20.0000 [IU] | PEN_INJECTOR | Freq: Every day | SUBCUTANEOUS | 2 refills | Status: DC

## 2021-05-11 NOTE — Progress Notes (Signed)
  Subjective:  Patient ID: Chad Kirk, male    DOB: 12/24/1960,  MRN: [id number]  60 y.o. male presents at risk foot care with history of diabetic neuropathy and thick, elongated toenails b/l feet which are tender when wearing enclosed shoe gear.  Patient states blood glucose was 55 mg/dl this morning. He states he had to drink juice to bring it up.  He notes no new pedal problems on today's visit. He does have h/o ulceration of developed in May of 2022. He saw Dr. Allena Katz on  6/82022 and was diagnosed with ulceration of left hallux. Ulceration healed on 02/22/2021.  PCP is Anabel Halon, MD , and last visit was 04/11/2021.  He was fitted for a pair of diabetic shoes and received those last month. He relates no problems with his new shoes. Mr. Slawinski voices no new pedal concerns on today's visit.   No Known Allergies  Review of Systems: Negative except as noted in the HPI.   Objective:  Vascular Examination: Vascular examination: faintly palpable DP pulses b/l;  nonpalpable PT pulses b/l.Marland Kitchen CFT I<3 seconds to all digits b/l.  No edema. No pain with calf compression b/l. Skin temperature gradient WNL b/l.   Neurological Examination: Sensation diminished  with 10 gram monofilament b/l. Vibratory sensation intact b/l.   Dermatological Examination: Pedal skin with normal turgor, texture and tone b/l. Toenails 1-5 b/l thick, discolored, elongated with subungual debris and pain on dorsal palpation. Hyperkeratotic lesion(s) L hallux and R hallux.  No erythema, no edema, no drainage, no fluctuance.  Musculoskeletal Examination: Muscle strength 5/5 to b/l LE. HAV with bunion deformity b/l. Hammertoes 2-5 b/l.  Radiographs: None Assessment:   1. Pain due to onychomycosis of toenails of both feet   2. Callus   3. Type 2 diabetes mellitus with diabetic polyneuropathy, with long-term current use of insulin (HCC)     Plan:  -Examined patient. -Continue diabetic foot care principles: inspect  feet daily, monitor glucose as recommended by PCP and/or Endocrinologist, and follow prescribed diet per PCP, Endocrinologist and/or dietician. -Patient to continue soft, supportive shoe gear daily. -Toenails 1-5 b/l were debrided in length and girth with sterile nail nippers and dremel without iatrogenic bleeding.  -Callus(es) L hallux and R hallux pared utilizing sterile scalpel blade without complication or incident. Total number debrided =2. -Patient to report any pedal injuries to medical professional immediately. -Patient/POA to call should there be question/concern in the interim.  Return in about 3 months (around 08/04/2021).  Freddie Breech, DPM

## 2021-05-11 NOTE — Progress Notes (Signed)
05/11/2021, 12:13 PM  Endocrinology follow-up note   Subjective:    Patient ID: Chad Kirk, male    DOB: Nov 29, 1960.  Chad Kirk is being seen in follow-up after he was seen in consultation for management of currently uncontrolled symptomatic diabetes requested by  [name], MD.   Past Medical History:  Diagnosis Date   Diabetes mellitus without complication ([place])    DIAGNOSED AGE 60   Diabetic ulcer of left great toe ([place]) 01/25/2021   Healed diabetic foot ulcer 02/22/2021   Hypertension     Past Surgical History:  Procedure Laterality Date   ESOPHAGEAL DILATION N/A 12/07/2019   Procedure: ESOPHAGEAL OR PYLORIC DILATION;  Surgeon: Danie Binder, MD;  Location: [facility];  Service: Endoscopy;  Laterality: N/A;   ESOPHAGOGASTRODUODENOSCOPY N/A 12/07/2019   erosive gastritis, many non-bleeding cratered and superficial duodenal ulcers without stigmata of bleeding in duodenal bulb. Empiric dilation due to possible occult esophageal web. Negative H.pylori.    EYE SURGERY  03/02/2020   diabetic retinopathy, lens placement, cataract removal, right eye   HERNIA REPAIR Left 1990    Social History   Socioeconomic History   Marital status: Married    Spouse name: Not on file   Number of children: Not on file   Years of education: Not on file   Highest education level: Not on file  Occupational History   Not on file  Tobacco Use   Smoking status: Former    Packs/day: 1.00    Years: 5.00    Pack years: 5.00    Types: Cigarettes    Quit date: 08/20/1988    Years since quitting: 32.7   Smokeless tobacco: Never  Vaping Use   Vaping Use: Never used  Substance and Sexual Activity   Alcohol use: Not Currently    Comment: denied 03/15/20   Drug use: No   Sexual activity: Not on file  Other Topics Concern   Not on file  Social History Narrative   Not on file   Social Determinants  of Health   Financial Resource Strain: Not on file  Food Insecurity: Not on file  Transportation Needs: Not on file  Physical Activity: Not on file  Stress: Not on file  Social Connections: Not on file    Family History  Problem Relation Age of Onset   Stroke Mother    Hypertension Mother    Diabetes Mother    Hypertension Father    Diabetes Father    Colon cancer Neg Hx    Colon polyps Neg Hx     Outpatient Encounter Medications as of 05/11/2021  Medication Sig   amLODipine (NORVASC) 10 MG tablet Take 1 tablet by mouth once daily   atropine 1 % ophthalmic solution SMARTSIG:1 Drop(s) Left Eye Morning-Night   blood glucose meter kit and supplies KIT 1 each by Does not apply route 4 (four) times daily. Dispense based on patient and insurance preference. Use up to four times daily as directed.   Blood Glucose Monitoring Suppl (ACCU-CHEK GUIDE ME) w/Device KIT 1 Piece by Does not apply route as directed.  Carboxymethylcellulose Sodium (LUBRICANT EYE DROPS OP) Apply 1 Container to eye as needed (dry eye). Left eye   Continuous Blood Gluc Receiver (FREESTYLE LIBRE 2 READER) DEVI As directed   Continuous Blood Gluc Sensor (FREESTYLE LIBRE 2 SENSOR) MISC USE AS DIRECTED TO  CHECK  BLOOD  GLUCOSE  -  CHANGE  SENSOR  EVERY  14  DAYS (PATIENT NEEDS APPOINTMENT FOR FURTHER REFILLS)   dorzolamide (TRUSOPT) 2 % ophthalmic solution Place 1 drop into the left eye 3 (three) times daily.   finasteride (PROSCAR) 5 MG tablet Take 1 tablet by mouth once daily   glipiZIDE (GLUCOTROL XL) 5 MG 24 hr tablet Take 1 tablet by mouth once daily with breakfast   glucose blood (ONETOUCH VERIO) test strip USE 1 STRIP TO CHECK GLUCOSE UP TO 2 TIMES DAILY   Insulin Pen Needle (B-D ULTRAFINE III SHORT PEN) 31G X 8 MM MISC 1 each by Does not apply route as directed.   Lancets (ONETOUCH DELICA PLUS [id number]) MISC Apply topically.   LANTUS SOLOSTAR 100 UNIT/ML Solostar Pen Inject 20 Units into the skin at bedtime.    losartan (COZAAR) 50 MG tablet TAKE 1 TABLET BY MOUTH  DAILY   Multiple Vitamins-Minerals (QC DAILY MULTIVIT/MULTIMINERAL PO) Take 1 tablet by mouth daily.   prednisoLONE acetate (PRED FORTE) 1 % ophthalmic suspension 1 drop 4 (four) times daily.   Probiotic Product (PROBIOTIC PO) Take 1 tablet by mouth daily.   rosuvastatin (CRESTOR) 10 MG tablet Take 1 tablet by mouth once daily   senna-docusate (SENOKOT-S) 8.6-50 MG tablet Take 1 tablet by mouth as needed.    tadalafil (CIALIS) 20 MG tablet Take 1 tablet (20 mg total) by mouth daily as needed for erectile dysfunction.   tamsulosin (FLOMAX) 0.4 MG CAPS capsule Take 1 capsule by mouth once daily   timolol (TIMOPTIC) 0.5 % ophthalmic solution Place 1 drop into both eyes 3 times daily.   timolol (TIMOPTIC) 0.5 % ophthalmic solution Place 1 drop into both eyes 3 times daily.   UNABLE TO FIND Take 5 mLs by mouth every 4 (four) hours as needed. Med Name: Apothecary Cough Syrup Take 5 mL by mouth every 4 to 6 hours as needed for cough   [DISCONTINUED] LANTUS SOLOSTAR 100 UNIT/ML Solostar Pen Inject 30 Units into the skin at bedtime. (Patient taking differently: Inject 20 Units into the skin at bedtime.)   [DISCONTINUED] ONETOUCH VERIO test strip USE 1 STRIP TO CHECK GLUCOSE UP TO 4 TIMES DAILY   No facility-administered encounter medications on file as of 05/11/2021.    ALLERGIES: No Known Allergies  VACCINATION STATUS: Immunization History  Administered Date(s) Administered   PFIZER(Purple Top)SARS-COV-2 Vaccination 10/17/2019, 11/07/2019   Pneumococcal Polysaccharide-23 06/30/2020    Diabetes He presents for his follow-up diabetic visit. He has type 2 diabetes mellitus. Onset time: He was diagnosed at approximate age of 60 years. His disease course has been improving. Pertinent negatives for hypoglycemia include no confusion, headaches, nervousness/anxiousness, pallor, seizures, sweats or tremors. Pertinent negatives for diabetes include no  blurred vision, no chest pain, no fatigue, no polydipsia, no polyphagia, no polyuria and no weakness. There are no hypoglycemic complications. Symptoms are improving. Diabetic complications include impotence, nephropathy, peripheral neuropathy, PVD and retinopathy. (He is reporting legal blindness from diabetic retinopathy on both eyes.) Risk factors for coronary artery disease include dyslipidemia, diabetes mellitus, family history, male sex, hypertension and sedentary lifestyle. Current diabetic treatments: He is currently on Lantus 15-20 units 1-2 times a day. His weight  is fluctuating minimally. He is following a generally unhealthy diet. When asked about meal planning, he reported none. His home blood glucose trend is decreasing steadily. His breakfast blood glucose range is generally 140-180 mg/dl. His lunch blood glucose range is generally 140-180 mg/dl. His dinner blood glucose range is generally 180-200 mg/dl. His bedtime blood glucose range is generally 140-180 mg/dl. His overall blood glucose range is 140-180 mg/dl. (Mr. Hensen presents with his CGM device showing continued improvement in his glycemic profile.  His AGP report shows 69% time range, 29% above range, minimal unremarkable hypoglycemia.  His point-of-care A1c is 8% improving from 8.9% during his last visit.   ) He does not see a podiatrist.Eye exam is current.  Hyperlipidemia This is a chronic problem. The current episode started more than 1 year ago. The problem is uncontrolled. Recent lipid tests were reviewed and are high. Exacerbating diseases include chronic renal disease and diabetes. Pertinent negatives include no chest pain, myalgias or shortness of breath. Current antihyperlipidemic treatment includes statins. Risk factors for coronary artery disease include diabetes mellitus, dyslipidemia, hypertension, male sex, a sedentary lifestyle and family history.  Hypertension This is a chronic problem. The current episode started more  than 1 year ago. The problem is controlled. Pertinent negatives include no blurred vision, chest pain, headaches, neck pain, palpitations, shortness of breath or sweats. Risk factors for coronary artery disease include dyslipidemia, family history, diabetes mellitus, male gender and sedentary lifestyle. Past treatments include calcium channel blockers. Hypertensive end-organ damage includes kidney disease, PVD and retinopathy. Identifiable causes of hypertension include chronic renal disease.    Review of Systems  Constitutional:  Negative for chills, fatigue, fever and unexpected weight change.  HENT:  Negative for dental problem, mouth sores and trouble swallowing.   Eyes:  Negative for blurred vision and visual disturbance.  Respiratory:  Negative for cough, choking, chest tightness, shortness of breath and wheezing.   Cardiovascular:  Negative for chest pain, palpitations and leg swelling.  Gastrointestinal:  Negative for abdominal distention, abdominal pain, constipation, diarrhea, nausea and vomiting.  Endocrine: Negative for polydipsia, polyphagia and polyuria.  Genitourinary:  Positive for impotence. Negative for dysuria, flank pain, hematuria and urgency.  Musculoskeletal:  Negative for back pain, gait problem, myalgias and neck pain.  Skin:  Negative for pallor, rash and wound.  Neurological:  Negative for tremors, seizures, syncope, weakness, numbness and headaches.  Psychiatric/Behavioral:  Negative for confusion and dysphoric mood. The patient is not nervous/anxious.    Objective:    Vitals with BMI 05/11/2021 04/11/2021 02/08/2021  Height 5' 10" 5' 10" 5' 6"  Weight 183 lbs 13 oz 186 lbs 183 lbs 3 oz  BMI 26.37 76.54 65.03  Systolic 546 568 127  Diastolic 86 72 80  Pulse 80 88 84    BP (!) 148/86   Pulse 80   Ht 5' 10" (1.778 m)   Wt 183 lb 12.8 oz (83.4 kg)   BMI 26.37 kg/m   Wt Readings from Last 3 Encounters:  05/11/21 183 lb 12.8 oz (83.4 kg)  04/11/21 186 lb (84.4  kg)  02/08/21 183 lb 3.2 oz (83.1 kg)     Physical Exam Constitutional:      General: He is not in acute distress.    Appearance: He is well-developed.  HENT:     Head: Normocephalic and atraumatic.  Neck:     Thyroid: No thyromegaly.     Trachea: No tracheal deviation.  Cardiovascular:     Rate and Rhythm:  Normal rate.     Pulses:          Dorsalis pedis pulses are 1+ on the right side and 1+ on the left side.       Posterior tibial pulses are 1+ on the right side and 1+ on the left side.     Heart sounds: Normal heart sounds, S1 normal and S2 normal. No murmur heard.   No gallop.  Pulmonary:     Effort: Pulmonary effort is normal. No respiratory distress.     Breath sounds: No wheezing.  Abdominal:     General: There is no distension.     Tenderness: There is no abdominal tenderness. There is no guarding.  Musculoskeletal:     Right shoulder: No swelling or deformity.     Cervical back: Normal range of motion and neck supple.     Comments: Signs of poor peripheral circulation.  He is following with podiatry for left foot diabetic foot ulcer.    Skin:    General: Skin is warm and dry.     Findings: No rash.     Nails: There is no clubbing.  Neurological:     Mental Status: He is alert and oriented to person, place, and time.     Cranial Nerves: No cranial nerve deficit.     Sensory: No sensory deficit.     Gait: Gait normal.     Deep Tendon Reflexes: Reflexes are normal and symmetric.  Psychiatric:        Speech: Speech normal.        Behavior: Behavior normal. Behavior is cooperative.        Thought Content: Thought content normal.        Judgment: Judgment normal.    CMP ( most recent) CMP     Component Value Date/Time   NA 140 05/02/2021 1131   K 4.6 05/02/2021 1131   CL 104 05/02/2021 1131   CO2 26 05/02/2021 1131   GLUCOSE 101 (H) 05/02/2021 1131   GLUCOSE 256 (H) 12/30/2019 1218   BUN 15 05/02/2021 1131   CREATININE 0.96 05/02/2021 1131   CALCIUM 9.2  05/02/2021 1131   PROT 6.8 05/02/2021 1131   ALBUMIN 4.1 05/02/2021 1131   AST 25 05/02/2021 1131   ALT 38 05/02/2021 1131   ALKPHOS 81 05/02/2021 1131   BILITOT 0.4 05/02/2021 1131   GFRNONAA >60 12/30/2019 [place] >60 12/30/2019 1218     Diabetic Labs (most recent): Lab Results  Component Value Date   HGBA1C 8.0 (A) 05/11/2021   HGBA1C 8.9 (A) 02/08/2021   HGBA1C 8.5 (A) 11/07/2020     Lipid Panel ( most recent) Lipid Panel     Component Value Date/Time   CHOL 119 11/02/2020 0859   TRIG 82 11/02/2020 0859   HDL 34 (L) 11/02/2020 0859   CHOLHDL 3.5 11/02/2020 0859   CHOLHDL 4.4 12/30/2019 1218   VLDL 15 12/30/2019 1218   LDLCALC 69 11/02/2020 0859   LABVLDL 16 11/02/2020 0859      Assessment & Plan:   1. Type 2 diabetes, uncontrolled, with retinopathy with macular edema ([facility])  - Bawi Lakins has currently uncontrolled symptomatic type 2 DM since  60 years of age.  Mr. Villamor presents with his CGM device showing continued improvement in his glycemic profile.  His AGP report shows 69% time range, 29% above range, minimal unremarkable hypoglycemia.  His point-of-care A1c is 8% improving from 8.9% during his last visit.    -  I had a long discussion with him about the progressive nature of diabetes and the pathology behind its complications. -his diabetes is complicated by retinopathy, peripheral arterial disease, peripheral neuropathy and he remains at a high risk for more acute and chronic complications which include CAD, CVA, CKD, retinopathy, and neuropathy. These are all discussed in detail with him.  - I have counseled him on diet  and weight management  by adopting a carbohydrate restricted/protein rich diet. Patient is encouraged to switch to  unprocessed or minimally processed     complex starch and increased protein intake (animal or plant source), fruits, and vegetables. -  he is advised to stick to a routine mealtimes to eat 3 meals  a day and avoid  unnecessary snacks ( to snack only to correct hypoglycemia).   - he acknowledges that there is a room for improvement in his food and drink choices. - Suggestion is made for him to avoid simple carbohydrates  from his diet including Cakes, Sweet Desserts, Ice Cream, Soda (diet and regular), Sweet Tea, Candies, Chips, Cookies, Store Bought Juices, Alcohol in Excess of  1-2 drinks a day, Artificial Sweeteners,  Coffee Creamer, and "Sugar-free" Products, Lemonade. This will help patient to have more stable blood glucose profile and potentially avoid unintended weight gain.   - he will be scheduled with Jearld Fenton, RDN, CDE for diabetes education.  - I have approached him with the following individualized plan to manage  his diabetes and patient agrees:   -In light of his presentation with significantly improved glycemic profile, and A1c of 8%, he will not need prandial insulin for now.  He is advised to continue on lower dose of Lantus at 20 units nightly, urged him to use his CGM at all times.    - he is encouraged to call clinic for blood glucose levels less than 70 or above 200 mg /dl.  - he is warned not to take insulin without proper monitoring per orders.   -He has responded and benefited from low-dose glipizide.  He is advised to continue glipizide 5 mg XL p.o. daily at breakfast.      - Specific targets for  A1c;  LDL, HDL,  and Triglycerides were discussed with the patient.  2) Blood Pressure /Hypertension: -His blood pressure is not controlled to target. he is advised to continue his current medications including amlodipine 10 mg p.o.. daily with breakfast .   3) Lipids/Hyperlipidemia:   Review of his recent lipid panel showed un controlled LDL at 149.  He is advised to continue Crestor 10 mg p.o. daily at bedtime.  Side effects and precautions discussed with him.     4)  Weight/Diet:  Body mass index is 26.37 kg/m.  -   he is not a candidate for weight loss. Exercise, and  detailed carbohydrates information provided  -  detailed on discharge instructions.  5) Chronic Care/Health Maintenance:  -he  is on  Statin medications and  is encouraged to initiate and continue to follow up with Ophthalmology, Dentist,  Podiatrist at least yearly or according to recommendations, and advised to   stay away from smoking. I have recommended yearly flu vaccine and pneumonia vaccine at least every 5 years; moderate intensity exercise for up to 150 minutes weekly; and  sleep for at least 7 hours a day.  His screening ABI was normal in September 2021.  He has left lower extremity diabetic foot ulcer, under care with podiatry.    His next  study is due in September 2027, or sooner if needed.  - he is  advised to maintain close follow up with his pcp for primary care needs, as well as his other providers for optimal and coordinated care.   I spent 41 minutes in the care of the patient today including review of labs from [facility], Lipids, Thyroid Function, Hematology (current and previous including abstractions from other facilities); face-to-face time discussing  his blood glucose readings/logs, discussing hypoglycemia and hyperglycemia episodes and symptoms, medications doses, his options of short and long term treatment based on the latest standards of care / guidelines;  discussion about incorporating lifestyle medicine;  and documenting the encounter.    Please refer to Patient Instructions for Blood Glucose Monitoring and Insulin/Medications Dosing Guide"  in media tab for additional information. Please  also refer to " Patient Self Inventory" in the Media  tab for reviewed elements of pertinent patient history.  Bradd Canary participated in the discussions, expressed understanding, and voiced agreement with the above plans.  All questions were answered to his satisfaction. he is encouraged to contact clinic should he have any questions or concerns prior to his return visit.    Follow  up plan: - Return in about 3 months (around 08/10/2021) for Bring Meter and Logs- A1c in Office.  Glade Lloyd, MD [facility] [address] [place], [place] [address] Phone: [phone]  Fax: [phone]    05/11/2021, 12:13 PM  This note was partially dictated with voice recognition software. Similar sounding words can be transcribed inadequately or may not  be corrected upon review.

## 2021-05-11 NOTE — Patient Instructions (Signed)
                                     Advice for Weight Management  -For most of us the best way to lose weight is by diet management. Generally speaking, diet management means consuming less calories intentionally which over time brings about progressive weight loss.  This can be achieved more effectively by restricting carbohydrate consumption to the minimum possible.  So, it is critically important to know your numbers: how much calorie you are consuming and how much calorie you need. More importantly, our carbohydrates sources should be unprocessed or minimally processed complex starch food items.   Sometimes, it is important to balance nutrition by increasing protein intake (animal or plant source), fruits, and vegetables.  - Whole Food, Plant Predominant Nutrition is highly recommended: Eat Plenty of vegetables, Mushrooms, fruits, Legumes, Whole Grains, Nuts, seeds in lieu of processed meats, processed snacks/pastries red meat, poultry, eggs.  -Sticking to a routine mealtime to eat 3 meals a day and avoiding unnecessary snacks is shown to have a big role in weight control. Under normal circumstances, the only time we lose real weight is when we are hungry, so allow hunger to take place- hunger means no food between meal times, only water.  It is not advisable to starve.   -It is better to avoid simple carbohydrates including: Cakes, Sweet Desserts, Ice Cream, Soda (diet and regular), Sweet Tea, Candies, Chips, Cookies, Store Bought Juices, Alcohol in Excess of  1-2 drinks a day, Lemonade,  Artificial Sweeteners, Doughnuts, Coffee Creamers, "Sugar-free" Products, etc, etc.  This is not a complete list.....    -Consulting with certified diabetes educators is proven to provide you with the most accurate and current information on diet.  Also, you may be  interested in discussing diet options/exchanges , we can schedule a visit with Chad Kirk, RDN, CDE for  individualized nutrition education.  -Exercise: If you are able: 30 -60 minutes a day ,4 days a week, or 150 minutes a week.  The longer the better.  Combine stretch, strength, and aerobic activities.  If you were told in the past that you have high risk for cardiovascular diseases, you may seek evaluation by your heart doctor prior to initiating moderate to intense exercise programs.                                  Additional Care Considerations for Diabetes   -Diabetes  is a chronic disease.  The most important care consideration is regular follow-up with your diabetes care provider with the goal being avoiding or delaying its complications and to take advantage of advances in medications and technology.    - Whole Food, Plant Predominant Nutrition is highly recommended: Eat Plenty of vegetables, Mushrooms, fruits, Legumes, Whole Grains, Nuts, seeds in lieu of processed meats, processed snacks/pastries red meat, poultry, eggs.  -Type 2 diabetes is known to coexist with other important comorbidities such as high blood pressure and high cholesterol.  It is critical to control not only the diabetes but also the high blood pressure and high cholesterol to minimize and delay the risk of complications including coronary artery disease, stroke, amputations, blindness, etc.    - Studies showed that people with diabetes will benefit from a class of medications known as ACE inhibitors and statins.  Unless   there are specific reasons not to be on these medications, the standard of care is to consider getting one from these groups of medications at an optimal doses.  These medications are generally considered safe and proven to help protect the heart and the kidneys.    - People with diabetes are encouraged to initiate and maintain regular follow-up with eye doctors, foot doctors, dentists , and if necessary heart and kidney doctors.     - It is highly recommended that people with diabetes quit smoking or  stay away from smoking, and get yearly  flu vaccine and pneumonia vaccine at least every 5 years.  One other important lifestyle recommendation is to ensure adequate sleep - at least 6-7 hours of uninterrupted sleep at night.  -Exercise: If you are able: 30 -60 minutes a day, 4 days a week, or 150 minutes a week.  The longer the better.  Combine stretch, strength, and aerobic activities.  If you were told in the past that you have high risk for cardiovascular diseases, you may seek evaluation by your heart doctor prior to initiating moderate to intense exercise programs.         

## 2021-05-15 ENCOUNTER — Other Ambulatory Visit: Payer: Self-pay | Admitting: *Deleted

## 2021-05-15 ENCOUNTER — Telehealth: Payer: Self-pay

## 2021-05-15 DIAGNOSIS — I10 Essential (primary) hypertension: Secondary | ICD-10-CM

## 2021-05-15 MED ORDER — LOSARTAN POTASSIUM 50 MG PO TABS: 50.0000 mg | ORAL_TABLET | Freq: Every day | ORAL | 3 refills | Status: DC

## 2021-05-15 NOTE — Telephone Encounter (Signed)
Patient called said Dr [name] called in Amlodipine 10 mg and he does not take this medicine. Said he gets his blood pressure medicine from Dr Allena Katz and needs a refill on Lisinopril but not sure what mg he takes. Call back # [phone]  Pharmacy: [facility]

## 2021-05-15 NOTE — Telephone Encounter (Signed)
Pt medication sent to pharmacy  

## 2021-05-31 ENCOUNTER — Other Ambulatory Visit: Payer: Self-pay | Admitting: Internal Medicine

## 2021-05-31 DIAGNOSIS — N400 Enlarged prostate without lower urinary tract symptoms: Secondary | ICD-10-CM

## 2021-06-18 ENCOUNTER — Other Ambulatory Visit: Payer: Self-pay | Admitting: "Endocrinology

## 2021-06-27 ENCOUNTER — Ambulatory Visit (INDEPENDENT_AMBULATORY_CARE_PROVIDER_SITE_OTHER): Payer: 59

## 2021-06-27 ENCOUNTER — Encounter: Payer: Self-pay | Admitting: Nurse Practitioner

## 2021-06-27 DIAGNOSIS — Z23 Encounter for immunization: Secondary | ICD-10-CM

## 2021-06-27 NOTE — Patient Instructions (Signed)
After receiving consent, patient was given the flu vaccine. Pt waited in office for 15 min with no adverse reactions and instructed to contact our office if he experiences them later. Pt to repeat vaccine in 1 year.

## 2021-07-05 ENCOUNTER — Other Ambulatory Visit: Payer: Self-pay | Admitting: "Endocrinology

## 2021-07-17 ENCOUNTER — Other Ambulatory Visit: Payer: Self-pay | Admitting: Internal Medicine

## 2021-07-17 DIAGNOSIS — N529 Male erectile dysfunction, unspecified: Secondary | ICD-10-CM

## 2021-07-18 ENCOUNTER — Other Ambulatory Visit: Payer: Self-pay | Admitting: *Deleted

## 2021-07-18 ENCOUNTER — Telehealth: Payer: Self-pay | Admitting: Internal Medicine

## 2021-07-18 DIAGNOSIS — N400 Enlarged prostate without lower urinary tract symptoms: Secondary | ICD-10-CM

## 2021-07-18 MED ORDER — FINASTERIDE 5 MG PO TABS: 5.0000 mg | ORAL_TABLET | Freq: Every day | ORAL | 1 refills | Status: DC

## 2021-07-18 NOTE — Telephone Encounter (Signed)
Pt called in for refill on   finasteride (PROSCAR) 5 MG tablet  [facility]   Pt will be heading to Pharm tomorrow

## 2021-07-18 NOTE — Telephone Encounter (Signed)
Medication sent to pharmacy  

## 2021-07-26 ENCOUNTER — Ambulatory Visit (INDEPENDENT_AMBULATORY_CARE_PROVIDER_SITE_OTHER): Payer: 59 | Admitting: Internal Medicine

## 2021-07-26 ENCOUNTER — Other Ambulatory Visit: Payer: Self-pay

## 2021-07-26 ENCOUNTER — Encounter: Payer: Self-pay | Admitting: Internal Medicine

## 2021-07-26 DIAGNOSIS — J209 Acute bronchitis, unspecified: Secondary | ICD-10-CM | POA: Diagnosis not present

## 2021-07-26 MED ORDER — BENZONATATE 100 MG PO CAPS: 100.0000 mg | ORAL_CAPSULE | Freq: Two times a day (BID) | ORAL | 0 refills | Status: DC | PRN

## 2021-07-26 MED ORDER — AZITHROMYCIN 250 MG PO TABS: ORAL_TABLET | ORAL | 0 refills | Status: AC

## 2021-07-26 NOTE — Progress Notes (Signed)
Virtual Visit via Telephone Note   This visit type was conducted due to national recommendations for restrictions regarding the COVID-19 Pandemic (e.g. social distancing) in an effort to limit this patient's exposure and mitigate transmission in our community.  Due to his co-morbid illnesses, this patient is at least at moderate risk for complications without adequate follow up.  This format is felt to be most appropriate for this patient at this time.  The patient did not have access to video technology/had technical difficulties with video requiring transitioning to audio format only (telephone).  All issues noted in this document were discussed and addressed.  No physical exam could be performed with this format.   Evaluation Performed:  Follow-up visit  Date:  07/26/2021   ID:  Chad Kirk, DOB 1961/01/01, MRN [id number]  Patient Location: Home Provider Location: Office/Clinic  Participants: Patient Location of Patient: Home Location of Provider: Telehealth Consent was obtain for visit to be over via telehealth. I verified that I am speaking with the correct person using two identifiers.  PCP:  [name], MD   Chief Complaint: Cough  History of Present Illness:    Chad Kirk is a 60 y.o. male who has a televisit for complaint of cough and nasal congestion, which became worse for the last 4 days.  Cough started about 10 days ago while he was on a cruise.  He denies any dyspnea or wheezing currently.  He has not had any COVID or flu testing yet.  He has had COVID and flu vaccine.  The patient does not have symptoms concerning for COVID-19 infection (fever, chills, cough, or new shortness of breath).   Past Medical, Surgical, Social History, Allergies, and Medications have been Reviewed.  Past Medical History:  Diagnosis Date   Diabetes mellitus without complication ([place])    DIAGNOSED AGE 52   Diabetic ulcer of left great toe ([place]) 01/25/2021   Healed diabetic foot  ulcer 02/22/2021   Hypertension    Past Surgical History:  Procedure Laterality Date   ESOPHAGEAL DILATION N/A 12/07/2019   Procedure: ESOPHAGEAL OR PYLORIC DILATION;  Surgeon: Danie Binder, MD;  [facility];  Service: Endoscopy;  Laterality: N/A;   ESOPHAGOGASTRODUODENOSCOPY N/A 12/07/2019   erosive gastritis, many non-bleeding cratered and superficial duodenal ulcers without stigmata of bleeding in duodenal bulb. Empiric dilation due to possible occult esophageal web. Negative H.pylori.    EYE SURGERY  03/02/2020   diabetic retinopathy, lens placement, cataract removal, right eye   HERNIA REPAIR Left 1990     Current Meds  Medication Sig   amLODipine (NORVASC) 10 MG tablet Take 1 tablet by mouth once daily   atropine 1 % ophthalmic solution SMARTSIG:1 Drop(s) Left Eye Morning-Night   blood glucose meter kit and supplies KIT 1 each by Does not apply route 4 (four) times daily. Dispense based on patient and insurance preference. Use up to four times daily as directed.   Blood Glucose Monitoring Suppl (ACCU-CHEK GUIDE ME) w/Device KIT 1 Piece by Does not apply route as directed.   Carboxymethylcellulose Sodium (LUBRICANT EYE DROPS OP) Apply 1 Container to eye as needed (dry eye). Left eye   Continuous Blood Gluc Receiver (FREESTYLE LIBRE 2 READER) DEVI As directed   Continuous Blood Gluc Sensor (FREESTYLE LIBRE 2 SENSOR) MISC USE AS DIRECTED TO CHECK BLOOD GLUCOSE CHANGE  SENSOR  EVERY  14  DAYS   dorzolamide (TRUSOPT) 2 % ophthalmic solution Place 1 drop into the left eye  3 (three) times daily.   finasteride (PROSCAR) 5 MG tablet Take 1 tablet (5 mg total) by mouth daily.   glipiZIDE (GLUCOTROL XL) 5 MG 24 hr tablet Take 1 tablet by mouth once daily with breakfast   glucose blood (ONETOUCH VERIO) test strip USE 1 STRIP TO CHECK GLUCOSE UP TO 2 TIMES DAILY   Insulin Pen Needle (B-D ULTRAFINE III SHORT PEN) 31G X 8 MM MISC 1 each by Does not apply route as directed.   Lancets  (ONETOUCH DELICA PLUS [id number]) MISC Apply topically.   LANTUS SOLOSTAR 100 UNIT/ML Solostar Pen Inject 20 Units into the skin at bedtime.   losartan (COZAAR) 50 MG tablet Take 1 tablet (50 mg total) by mouth daily.   Multiple Vitamins-Minerals (QC DAILY MULTIVIT/MULTIMINERAL PO) Take 1 tablet by mouth daily.   prednisoLONE acetate (PRED FORTE) 1 % ophthalmic suspension 1 drop 4 (four) times daily.   Probiotic Product (PROBIOTIC PO) Take 1 tablet by mouth daily.   rosuvastatin (CRESTOR) 10 MG tablet Take 1 tablet by mouth once daily   senna-docusate (SENOKOT-S) 8.6-50 MG tablet Take 1 tablet by mouth as needed.    tadalafil (CIALIS) 20 MG tablet Take 1 tablet (20 mg total) by mouth daily as needed for erectile dysfunction.   tamsulosin (FLOMAX) 0.4 MG CAPS capsule TAKE 1 CAPSULE BY MOUTH  DAILY   timolol (TIMOPTIC) 0.5 % ophthalmic solution Place 1 drop into both eyes 3 times daily.   UNABLE TO FIND Take 5 mLs by mouth every 4 (four) hours as needed. Med Name: Apothecary Cough Syrup Take 5 mL by mouth every 4 to 6 hours as needed for cough   [DISCONTINUED] timolol (TIMOPTIC) 0.5 % ophthalmic solution Place 1 drop into both eyes 3 times daily.     Allergies:   Patient has no known allergies.   ROS:   Please see the history of present illness.     All other systems reviewed and are negative.   Labs/Other Tests and Data Reviewed:    Recent Labs: 11/02/2020: TSH 1.920 05/02/2021: ALT 38; BUN 15; Creatinine, Ser 0.96; Potassium 4.6; Sodium 140   Recent Lipid Panel Lab Results  Component Value Date/Time   CHOL 119 11/02/2020 08:59 AM   TRIG 82 11/02/2020 08:59 AM   HDL 34 (L) 11/02/2020 08:59 AM   CHOLHDL 3.5 11/02/2020 08:59 AM   CHOLHDL 4.4 12/30/2019 12:18 PM   LDLCALC 69 11/02/2020 08:59 AM    Wt Readings from Last 3 Encounters:  05/11/21 183 lb 12.8 oz (83.4 kg)  04/11/21 186 lb (84.4 kg)  02/08/21 183 lb 3.2 oz (83.1 kg)     ASSESSMENT & PLAN:    Acute  bronchitis Persistent symptoms for last 10 days despite taking OTC Mucinex Started azithromycin No need to do COVID or flu testing now as he is outside of treatment window period Tessalon as needed for cough Advised to contact if he has persistent or worsening symptoms Avoided steroids due to his type II DM  Time:   Today, I have spent 12 minutes reviewing the chart, including problem list, medications, and with the patient with telehealth technology discussing the above problems.   Medication Adjustments/Labs and Tests Ordered: Current medicines are reviewed at length with the patient today.  Concerns regarding medicines are outlined above.   Tests Ordered: No orders of the defined types were placed in this encounter.   Medication Changes: No orders of the defined types were placed in this encounter.    Note:  This dictation was prepared with Dragon dictation along with smaller phrase technology. Similar sounding words can be transcribed inadequately or may not be corrected upon review. Any transcriptional errors that result from this process are unintentional.      Disposition:  Follow up  Signed, [name], MD  07/26/2021 10:25 AM     [place]

## 2021-08-07 ENCOUNTER — Other Ambulatory Visit: Payer: Self-pay

## 2021-08-07 ENCOUNTER — Ambulatory Visit: Payer: 59 | Admitting: Podiatry

## 2021-08-07 ENCOUNTER — Encounter: Payer: Self-pay | Admitting: Podiatry

## 2021-08-07 DIAGNOSIS — Z794 Long term (current) use of insulin: Secondary | ICD-10-CM | POA: Diagnosis not present

## 2021-08-07 DIAGNOSIS — M79674 Pain in right toe(s): Secondary | ICD-10-CM | POA: Diagnosis not present

## 2021-08-07 DIAGNOSIS — B351 Tinea unguium: Secondary | ICD-10-CM

## 2021-08-07 DIAGNOSIS — E119 Type 2 diabetes mellitus without complications: Secondary | ICD-10-CM

## 2021-08-07 DIAGNOSIS — M2041 Other hammer toe(s) (acquired), right foot: Secondary | ICD-10-CM | POA: Diagnosis not present

## 2021-08-07 DIAGNOSIS — M2042 Other hammer toe(s) (acquired), left foot: Secondary | ICD-10-CM | POA: Diagnosis not present

## 2021-08-07 DIAGNOSIS — M79675 Pain in left toe(s): Secondary | ICD-10-CM

## 2021-08-07 DIAGNOSIS — L84 Corns and callosities: Secondary | ICD-10-CM

## 2021-08-07 DIAGNOSIS — E1142 Type 2 diabetes mellitus with diabetic polyneuropathy: Secondary | ICD-10-CM

## 2021-08-07 DIAGNOSIS — T148XXA Other injury of unspecified body region, initial encounter: Secondary | ICD-10-CM

## 2021-08-07 NOTE — Progress Notes (Signed)
ANNUAL DIABETIC FOOT EXAM  Subjective: Chad Kirk presents today for for annual diabetic foot examination.  Patient relates 30 year h/o diabetes.  Patient has h/o foot wound of hallux which has healed.  Patient denies any numbness, tingling, burning, or pins/needle sensation in feet.  Patient's blood sugar was 158 mg/dl today.   Patient relates new concern of black spot on right great toe which appeared about one week ago. He states it was white at first, but it turned black. He does admit to wearing dress shoes to his daughter's wedding on November 19th.              [name], MD is patient's PCP. Last visit was 04/11/2021.  Past Medical History:  Diagnosis Date   Diabetes mellitus without complication ([place])    DIAGNOSED AGE 47   Diabetic ulcer of left great toe ([place]) 01/25/2021   Healed diabetic foot ulcer 02/22/2021   Hypertension    Patient Active Problem List   Diagnosis Date Noted   Erectile dysfunction 10/28/2020   Encounter for screening colonoscopy for non-high-risk patient 06/30/2020   PVD (peripheral vascular disease) ([place]) 06/30/2020   Pseudophakia of right eye 05/24/2020   Duodenal ulcer 03/15/2020   GERD (gastroesophageal reflux disease) 02/29/2020   Traction retinal detachment involving macula of left eye 11/16/2019   Diabetic macular edema ([place]) 08/10/2019   Left eye affected by proliferative diabetic retinopathy with traction retinal detachment involving macula, associated with type 2 diabetes mellitus ([place]) 08/10/2019   Retinal edema 07/03/2019   Nuclear sclerotic cataract of left eye 07/03/2019   Type 2 diabetes mellitus with ophthalmic complication ([place]) 15/17/6160   BPH (benign prostatic hyperplasia) 10/19/2018   Alcohol dependence ([place]) 10/19/2018   Meralgia paresthetica of right side 10/18/2018   Essential hypertension, benign 10/28/2015   Mixed hyperlipidemia 10/28/2015   Past Surgical History:  Procedure Laterality Date    ESOPHAGEAL DILATION N/A 12/07/2019   Procedure: ESOPHAGEAL OR PYLORIC DILATION;  Surgeon: Danie Binder, MD;  Location[facility];  Service: Endoscopy;  Laterality: N/A;   ESOPHAGOGASTRODUODENOSCOPY N/A 12/07/2019   erosive gastritis, many non-bleeding cratered and superficial duodenal ulcers without stigmata of bleeding in duodenal bulb. Empiric dilation due to possible occult esophageal web. Negative H.pylori.    EYE SURGERY  03/02/2020   diabetic retinopathy, lens placement, cataract removal, right eye   HERNIA REPAIR Left 1990   Current Outpatient Medications on File Prior to Visit  Medication Sig Dispense Refill   amLODipine (NORVASC) 10 MG tablet Take 1 tablet by mouth once daily 90 tablet 0   atropine 1 % ophthalmic solution SMARTSIG:1 Drop(s) Left Eye Morning-Night     blood glucose meter kit and supplies KIT 1 each by Does not apply route 4 (four) times daily. Dispense based on patient and insurance preference. Use up to four times daily as directed. 1 each 0   Blood Glucose Monitoring Suppl (ACCU-CHEK GUIDE ME) w/Device KIT 1 Piece by Does not apply route as directed. 1 kit 0   Carboxymethylcellulose Sodium (LUBRICANT EYE DROPS OP) Apply 1 Container to eye as needed (dry eye). Left eye     Continuous Blood Gluc Receiver (FREESTYLE LIBRE 2 READER) DEVI As directed 1 each 0   Continuous Blood Gluc Sensor (FREESTYLE LIBRE 2 SENSOR) MISC USE AS DIRECTED TO CHECK BLOOD GLUCOSE CHANGE  SENSOR  EVERY  14  DAYS 2 each 2   dorzolamide (TRUSOPT) 2 % ophthalmic solution Place 1 drop into the left eye 3 (  three) times daily.     glipiZIDE (GLUCOTROL XL) 5 MG 24 hr tablet Take 1 tablet by mouth once daily with breakfast 90 tablet 0   glucose blood (ONETOUCH VERIO) test strip USE 1 STRIP TO CHECK GLUCOSE UP TO 2 TIMES DAILY 100 each 2   Insulin Pen Needle (B-D ULTRAFINE III SHORT PEN) 31G X 8 MM MISC 1 each by Does not apply route as directed. 100 each 3   Lancets (ONETOUCH DELICA PLUS [id number])  MISC Apply topically.     LANTUS SOLOSTAR 100 UNIT/ML Solostar Pen Inject 20 Units into the skin at bedtime. 15 mL 2   losartan (COZAAR) 50 MG tablet Take 1 tablet (50 mg total) by mouth daily. 90 tablet 3   Multiple Vitamins-Minerals (QC DAILY MULTIVIT/MULTIMINERAL PO) Take 1 tablet by mouth daily.     prednisoLONE acetate (PRED FORTE) 1 % ophthalmic suspension 1 drop 4 (four) times daily.     Probiotic Product (PROBIOTIC PO) Take 1 tablet by mouth daily.     rosuvastatin (CRESTOR) 10 MG tablet Take 1 tablet by mouth once daily 90 tablet 0   senna-docusate (SENOKOT-S) 8.6-50 MG tablet Take 1 tablet by mouth as needed.      tamsulosin (FLOMAX) 0.4 MG CAPS capsule TAKE 1 CAPSULE BY MOUTH  DAILY 90 capsule 3   timolol (TIMOPTIC) 0.5 % ophthalmic solution Place 1 drop into both eyes 3 times daily.     UNABLE TO FIND Take 5 mLs by mouth every 4 (four) hours as needed. Med Name: Apothecary Cough Syrup Take 5 mL by mouth every 4 to 6 hours as needed for cough 120 mL 0   No current facility-administered medications on file prior to visit.    No Known Allergies Social History   Occupational History   Not on file  Tobacco Use   Smoking status: Former    Packs/day: 1.00    Years: 5.00    Pack years: 5.00    Types: Cigarettes    Quit date: 08/20/1988    Years since quitting: 33.0   Smokeless tobacco: Never  Vaping Use   Vaping Use: Never used  Substance and Sexual Activity   Alcohol use: Not Currently    Comment: denied 03/15/20   Drug use: No   Sexual activity: Not on file   Family History  Problem Relation Age of Onset   Stroke Mother    Hypertension Mother    Diabetes Mother    Hypertension Father    Diabetes Father    Colon cancer Neg Hx    Colon polyps Neg Hx    Immunization History  Administered Date(s) Administered   Fluad Quad(high Dose 65+) 06/27/2021   PFIZER(Purple Top)SARS-COV-2 Vaccination 10/17/2019, 11/07/2019   Pneumococcal Conjugate-13 08/08/2021   Pneumococcal  Polysaccharide-23 06/30/2020     Review of Systems: Negative except as noted in the HPI.   Objective: There were no vitals filed for this visit.  Chad Kirk is a pleasant 60 y.o. male in NAD. AAO X 3.  Vascular Examination: CFT <3 seconds b/l LE. Faintly palpable DP pulses b/l. Nonpalpable PT pulses b/l. Pedal hair absent b/l. Skin temperature gradient WNL b/l. No pain with calf compression b/l. No edema b/l LE. No cyanosis or clubbing noted b/l LE.  Dermatological Examination: Pedal integument with normal turgor, texture and tone BLE. No open wounds b/l LE. No interdigital macerations noted b/l LE. Toenails 1-5 b/l elongated, discolored, dystrophic, thickened, crumbly with subungual debris and tenderness to dorsal palpation.  Dried blood blisters noted right hallux and right 5th digit. No erythema, no edema, no drainage, no fluctuance . Hyperkeratotic lesion(s) bilateral great toes.  No erythema, no edema, no drainage, no fluctuance.  Musculoskeletal Examination: Normal muscle strength 5/5 to all lower extremity muscle groups bilaterally. HAV with bunion deformity noted b/l LE. Hammertoe deformity noted 2-5 b/l.Marland Kitchen No pain, crepitus or joint limitation noted with ROM b/l LE.  Patient ambulates independently without assistive aids.  Footwear Assessment: Does the patient wear appropriate shoes? Yes. Does the patient need inserts/orthotics? Yes.  Neurological Examination: Protective sensation diminished with 10g monofilament b/l.  Hemoglobin A1C Latest Ref Rng & Units 05/11/2021 02/08/2021 11/07/2020  HGBA1C 0.0 - 7.0 % 8.0(A) 8.9(A) 8.5(A)  Some recent data might be hidden   Assessment: 1. Pain due to onychomycosis of toenails of both feet   2. Callus   3. Blood blister   4. Acquired hammertoes of both feet   5. Type 2 diabetes mellitus with diabetic polyneuropathy, with long-term current use of insulin ([place])   6. Encounter for diabetic foot exam ([place])      ADA Risk  Categorization: High Risk  Patient has one or more of the following: Loss of protective sensation Absent pedal pulses Severe Foot deformity History of foot ulcer  Plan: -Discussed blood blisters right hallux and right 5th digit. Stable today. Monitor. Will have him see Dr. Posey Pronto in 2 weeks for toe check. Counseled him on wearing dress shoes and threshold time to wound development discussed when wearing ill fitting shoe gear. He related understanding . -Diabetic foot examination performed today. -Continue foot and shoe inspections daily. Monitor blood glucose per PCP/Endocrinologist's recommendations. -Patient to continue soft, supportive shoe gear daily. Start procedure for diabetic shoes. Patient qualifies based on diagnoses. -Patient to schedule appointment with Pedorthist for diabetic shoe measurements. -Mycotic toenails 1-5 bilaterally were debrided in length and girth with sterile nail nippers and dremel without incident. -Callus(es) bilateral great toes pared utilizing sterile scalpel blade without complication or incident. Total number debrided =2. -Patient/POA to call should there be question/concern in the interim.  Return in about 3 months (around 11/05/2021).  Marzetta Board, DPM

## 2021-08-08 ENCOUNTER — Encounter: Payer: Self-pay | Admitting: Internal Medicine

## 2021-08-08 ENCOUNTER — Ambulatory Visit: Payer: 59 | Admitting: Internal Medicine

## 2021-08-08 VITALS — BP 132/82 | HR 82 | Resp 18 | Ht 70.0 in | Wt 181.1 lb

## 2021-08-08 DIAGNOSIS — Z0001 Encounter for general adult medical examination with abnormal findings: Secondary | ICD-10-CM | POA: Diagnosis not present

## 2021-08-08 DIAGNOSIS — E782 Mixed hyperlipidemia: Secondary | ICD-10-CM

## 2021-08-08 DIAGNOSIS — Z23 Encounter for immunization: Secondary | ICD-10-CM | POA: Diagnosis not present

## 2021-08-08 DIAGNOSIS — J209 Acute bronchitis, unspecified: Secondary | ICD-10-CM

## 2021-08-08 DIAGNOSIS — Z1159 Encounter for screening for other viral diseases: Secondary | ICD-10-CM

## 2021-08-08 DIAGNOSIS — N529 Male erectile dysfunction, unspecified: Secondary | ICD-10-CM

## 2021-08-08 DIAGNOSIS — I10 Essential (primary) hypertension: Secondary | ICD-10-CM

## 2021-08-08 DIAGNOSIS — E559 Vitamin D deficiency, unspecified: Secondary | ICD-10-CM

## 2021-08-08 DIAGNOSIS — E113592 Type 2 diabetes mellitus with proliferative diabetic retinopathy without macular edema, left eye: Secondary | ICD-10-CM | POA: Diagnosis not present

## 2021-08-08 DIAGNOSIS — F1021 Alcohol dependence, in remission: Secondary | ICD-10-CM

## 2021-08-08 DIAGNOSIS — Z794 Long term (current) use of insulin: Secondary | ICD-10-CM

## 2021-08-08 DIAGNOSIS — Z0000 Encounter for general adult medical examination without abnormal findings: Secondary | ICD-10-CM

## 2021-08-08 DIAGNOSIS — N400 Enlarged prostate without lower urinary tract symptoms: Secondary | ICD-10-CM

## 2021-08-08 MED ORDER — TADALAFIL 20 MG PO TABS: 20.0000 mg | ORAL_TABLET | Freq: Every day | ORAL | 5 refills | Status: DC | PRN

## 2021-08-08 MED ORDER — BENZONATATE 100 MG PO CAPS: 100.0000 mg | ORAL_CAPSULE | Freq: Two times a day (BID) | ORAL | 0 refills | Status: DC | PRN

## 2021-08-08 MED ORDER — FINASTERIDE 5 MG PO TABS
5.0000 mg | ORAL_TABLET | Freq: Every day | ORAL | 3 refills | Status: DC
Start: 2021-08-08 — End: 2022-05-14

## 2021-08-08 NOTE — Assessment & Plan Note (Signed)
On Crestor Check lipid profile 

## 2021-08-08 NOTE — Assessment & Plan Note (Signed)
Well-controlled with Flomax and Finasteride

## 2021-08-08 NOTE — Patient Instructions (Signed)
Please continue to take medications as prescribed.  Please continue to follow low carb diet and perform moderate exercise/walking at least 150 mins/week.  Please get fasting blood tests done before the next visit. 

## 2021-08-08 NOTE — Assessment & Plan Note (Signed)
In remission.

## 2021-08-08 NOTE — Progress Notes (Signed)
Established Patient Office Visit  Subjective:  Patient ID: Chad Kirk, male    DOB: 01-19-1961  Age: 60 y.o. MRN: [id number]  CC:  Chief Complaint  Patient presents with   Follow-up    4 month follow up pt was given tessalon pearls for cough but he lost these and hasnt been able to find them they help with cough     HPI Chad Kirk is a 60 y.o. male with past medical history of uncontrolled diabetes mellitus type 2, PVD, hypertension, hyperlipidemia, diabetic macular edema, BPH and alcohol abuse who presents for f/u of his chronic medical conditions.  BP is well-controlled. Takes medications regularly. Patient denies headache, dizziness, chest pain, dyspnea or palpitations.   DM: He follows up with Dr. Dorris Fetch for diabetes management.  He takes Lantus regularly.  He is advised to follow diabetic diet as instructed. He follows up with ophthalmologist for diabetic macular edema and his vision has improved now. Denies any polyuria, polyphagia or fatigue. His blood glucose has been elevated lately as his daughter recently got married and due to holiday season related meals. He agrees to improve diet.  BPH: Has nocturia at times, but take Flomax and Finasteride regularly. Denies any dysuria or hematuria.  His breathing has improved now after taking Azithromycin, but still has cough. Requests refill of Tessalon as he lost it at home.  He received PCV13 in the office today.  Past Medical History:  Diagnosis Date   Diabetes mellitus without complication ([place])    DIAGNOSED AGE 15   Diabetic ulcer of left great toe ([place]) 01/25/2021   Healed diabetic foot ulcer 02/22/2021   Hypertension     Past Surgical History:  Procedure Laterality Date   ESOPHAGEAL DILATION N/A 12/07/2019   Procedure: ESOPHAGEAL OR PYLORIC DILATION;  Surgeon: Danie Binder, Chad;  Location: [facility];  Service: Endoscopy;  Laterality: N/A;   ESOPHAGOGASTRODUODENOSCOPY N/A 12/07/2019   erosive gastritis, many  non-bleeding cratered and superficial duodenal ulcers without stigmata of bleeding in duodenal bulb. Empiric dilation due to possible occult esophageal web. Negative H.pylori.    EYE SURGERY  03/02/2020   diabetic retinopathy, lens placement, cataract removal, right eye   HERNIA REPAIR Left 1990    Family History  Problem Relation Age of Onset   Stroke Mother    Hypertension Mother    Diabetes Mother    Hypertension Father    Diabetes Father    Colon cancer Neg Hx    Colon polyps Neg Hx     Social History   Socioeconomic History   Marital status: Married    Spouse name: Not on file   Number of children: Not on file   Years of education: Not on file   Highest education level: Not on file  Occupational History   Not on file  Tobacco Use   Smoking status: Former    Packs/day: 1.00    Years: 5.00    Pack years: 5.00    Types: Cigarettes    Quit date: 08/20/1988    Years since quitting: 32.9   Smokeless tobacco: Never  Vaping Use   Vaping Use: Never used  Substance and Sexual Activity   Alcohol use: Not Currently    Comment: denied 03/15/20   Drug use: No   Sexual activity: Not on file  Other Topics Concern   Not on file  Social History Narrative   Not on file   Social Determinants of Health   Financial Resource Strain: Not  on file  Food Insecurity: Not on file  Transportation Needs: Not on file  Physical Activity: Not on file  Stress: Not on file  Social Connections: Not on file  Intimate Partner Violence: Not on file    Outpatient Medications Prior to Visit  Medication Sig Dispense Refill   amLODipine (NORVASC) 10 MG tablet Take 1 tablet by mouth once daily 90 tablet 0   atropine 1 % ophthalmic solution SMARTSIG:1 Drop(s) Left Eye Morning-Night     blood glucose meter kit and supplies KIT 1 each by Does not apply route 4 (four) times daily. Dispense based on patient and insurance preference. Use up to four times daily as directed. 1 each 0   Blood Glucose  Monitoring Suppl (ACCU-CHEK GUIDE ME) w/Device KIT 1 Piece by Does not apply route as directed. 1 kit 0   Carboxymethylcellulose Sodium (LUBRICANT EYE DROPS OP) Apply 1 Container to eye as needed (dry eye). Left eye     Continuous Blood Gluc Receiver (FREESTYLE LIBRE 2 READER) DEVI As directed 1 each 0   Continuous Blood Gluc Sensor (FREESTYLE LIBRE 2 SENSOR) MISC USE AS DIRECTED TO CHECK BLOOD GLUCOSE CHANGE  SENSOR  EVERY  14  DAYS 2 each 2   dorzolamide (TRUSOPT) 2 % ophthalmic solution Place 1 drop into the left eye 3 (three) times daily.     glipiZIDE (GLUCOTROL XL) 5 MG 24 hr tablet Take 1 tablet by mouth once daily with breakfast 90 tablet 0   glucose blood (ONETOUCH VERIO) test strip USE 1 STRIP TO CHECK GLUCOSE UP TO 2 TIMES DAILY 100 each 2   Insulin Pen Needle (B-D ULTRAFINE III SHORT PEN) 31G X 8 MM MISC 1 each by Does not apply route as directed. 100 each 3   Lancets (ONETOUCH DELICA PLUS [id number]) MISC Apply topically.     LANTUS SOLOSTAR 100 UNIT/ML Solostar Pen Inject 20 Units into the skin at bedtime. 15 mL 2   losartan (COZAAR) 50 MG tablet Take 1 tablet (50 mg total) by mouth daily. 90 tablet 3   Multiple Vitamins-Minerals (QC DAILY MULTIVIT/MULTIMINERAL PO) Take 1 tablet by mouth daily.     prednisoLONE acetate (PRED FORTE) 1 % ophthalmic suspension 1 drop 4 (four) times daily.     Probiotic Product (PROBIOTIC PO) Take 1 tablet by mouth daily.     rosuvastatin (CRESTOR) 10 MG tablet Take 1 tablet by mouth once daily 90 tablet 0   senna-docusate (SENOKOT-S) 8.6-50 MG tablet Take 1 tablet by mouth as needed.      tamsulosin (FLOMAX) 0.4 MG CAPS capsule TAKE 1 CAPSULE BY MOUTH  DAILY 90 capsule 3   timolol (TIMOPTIC) 0.5 % ophthalmic solution Place 1 drop into both eyes 3 times daily.     UNABLE TO FIND Take 5 mLs by mouth every 4 (four) hours as needed. Med Name: Apothecary Cough Syrup Take 5 mL by mouth every 4 to 6 hours as needed for cough 120 mL 0   finasteride (PROSCAR) 5  MG tablet Take 1 tablet (5 mg total) by mouth daily. 90 tablet 1   tadalafil (CIALIS) 20 MG tablet Take 1 tablet (20 mg total) by mouth daily as needed for erectile dysfunction. 30 tablet 1   benzonatate (TESSALON) 100 MG capsule Take 1 capsule (100 mg total) by mouth 2 (two) times daily as needed for cough. (Patient not taking: Reported on 08/08/2021) 20 capsule 0   No facility-administered medications prior to visit.    No Known Allergies  ROS Review of Systems  Constitutional:  Negative for chills and fever.  HENT:  Negative for congestion and sore throat.   Eyes:  Positive for visual disturbance. Negative for pain and discharge.  Respiratory:  Negative for cough and shortness of breath.   Cardiovascular:  Negative for chest pain and palpitations.  Gastrointestinal:  Negative for constipation, diarrhea, nausea and vomiting.  Endocrine: Negative for polydipsia and polyuria.  Genitourinary:  Negative for dysuria and hematuria.  Musculoskeletal:  Negative for neck pain and neck stiffness.  Skin:  Negative for rash.  Neurological:  Negative for dizziness, weakness, numbness and headaches.  Psychiatric/Behavioral:  Negative for agitation and behavioral problems.      Objective:    Physical Exam Vitals reviewed.  Constitutional:      General: He is not in acute distress.    Appearance: He is not diaphoretic.  HENT:     Head: Normocephalic and atraumatic.     Nose: Nose normal.     Mouth/Throat:     Mouth: Mucous membranes are moist.  Eyes:     General: No scleral icterus.    Extraocular Movements: Extraocular movements intact.     Pupils: Pupils are equal, round, and reactive to light.  Cardiovascular:     Rate and Rhythm: Normal rate and regular rhythm.     Heart sounds: Normal heart sounds. No murmur heard.    Comments: DPA pulse 1+ b/l Pulmonary:     Breath sounds: Normal breath sounds. No wheezing or rales.  Abdominal:     Palpations: Abdomen is soft.     Tenderness:  There is no abdominal tenderness.  Musculoskeletal:     Cervical back: Neck supple. No tenderness.     Right lower leg: No edema.     Left lower leg: No edema.  Skin:    General: Skin is warm.     Findings: No rash.     Comments: No ulcer or skin breakdown on feet  Neurological:     General: No focal deficit present.     Mental Status: He is alert and oriented to person, place, and time.     Sensory: No sensory deficit.     Motor: No weakness.  Psychiatric:        Mood and Affect: Mood normal.        Behavior: Behavior normal.    BP 132/82 (BP Location: Right Arm, Patient Position: Sitting, Cuff Size: Normal)    Pulse 82    Resp 18    Ht _0  (1.778 m)    Wt 181 lb 1.3 oz (82.1 kg)    SpO2 100%    BMI 25.98 kg/m  Wt Readings from Last 3 Encounters:  08/08/21 181 lb 1.3 oz (82.1 kg)  05/11/21 183 lb 12.8 oz (83.4 kg)  04/11/21 186 lb (84.4 kg)    Lab Results  Component Value Date   TSH 1.920 11/02/2020   Lab Results  Component Value Date   WBC 5.2 12/08/2019   HGB 13.0 12/08/2019   HCT 41.3 12/08/2019   MCV 86.2 12/08/2019   PLT 193 12/08/2019   Lab Results  Component Value Date   NA 140 05/02/2021   K 4.6 05/02/2021   CO2 26 05/02/2021   GLUCOSE 101 (H) 05/02/2021   BUN 15 05/02/2021   CREATININE 0.96 05/02/2021   BILITOT 0.4 05/02/2021   ALKPHOS 81 05/02/2021   AST 25 05/02/2021   ALT 38 05/02/2021   PROT 6.8 05/02/2021  ALBUMIN 4.1 05/02/2021   CALCIUM 9.2 05/02/2021   ANIONGAP 8 12/30/2019   EGFR 90 05/02/2021   Lab Results  Component Value Date   CHOL 119 11/02/2020   Lab Results  Component Value Date   HDL 34 (L) 11/02/2020   Lab Results  Component Value Date   LDLCALC 69 11/02/2020   Lab Results  Component Value Date   TRIG 82 11/02/2020   Lab Results  Component Value Date   CHOLHDL 3.5 11/02/2020   Lab Results  Component Value Date   HGBA1C 8.0 (A) 05/11/2021      Assessment & Plan:   Problem List Items Addressed This  Visit       Cardiovascular and Mediastinum   Essential hypertension, benign    BP Readings from Last 1 Encounters:  08/08/21 132/82  Well-controlled with Losartan 50 mg QD Counseled for compliance with the medications Advised DASH diet and moderate exercise/walking, at least 150 mins/week      Relevant Medications   tadalafil (CIALIS) 20 MG tablet     Endocrine   Type 2 diabetes mellitus with ophthalmic complication (HCC) - Primary    Lab Results  Component Value Date   HGBA1C 8.0 (A) 05/11/2021  On Lantus 20 U qHS and Glipizide 5 mg QD F/u with Dr Dorris Fetch CGM showing more than 240 average number for last 2 weeks and 1 month, he has been noncompliant to diabetic diet Advised to follow diabetic diet On statin F/u CMP and lipid panel in the next visit Diabetic eye exam: Advised to continue to follow up with Ophthalmology for diabetic eye exam      Relevant Orders   CMP14+EGFR     Genitourinary   BPH (benign prostatic hyperplasia)    Well-controlled with Flomax and Finasteride      Relevant Medications   finasteride (PROSCAR) 5 MG tablet   Other Relevant Orders   PSA     Other   Mixed hyperlipidemia    On Crestor Check lipid profile      Relevant Medications   tadalafil (CIALIS) 20 MG tablet   Alcohol dependence (HCC)    In remission      Erectile dysfunction   Relevant Medications   tadalafil (CIALIS) 20 MG tablet   Other Visit Diagnoses     Need for hepatitis C screening test       Relevant Orders   Hepatitis C Antibody   Vitamin D deficiency       Relevant Orders   VITAMIN D 25 Hydroxy (Vit-D Deficiency, Fractures)       Meds ordered this encounter  Medications   benzonatate (TESSALON) 100 MG capsule    Sig: Take 1 capsule (100 mg total) by mouth 2 (two) times daily as needed for cough.    Dispense:  30 capsule    Refill:  0   finasteride (PROSCAR) 5 MG tablet    Sig: Take 1 tablet (5 mg total) by mouth daily.    Dispense:  90 tablet     Refill:  3   tadalafil (CIALIS) 20 MG tablet    Sig: Take 1 tablet (20 mg total) by mouth daily as needed for erectile dysfunction.    Dispense:  30 tablet    Refill:  5    Follow-up: Return in about 4 months (around 12/07/2021) for Annual physical.    [name], Chad

## 2021-08-08 NOTE — Assessment & Plan Note (Addendum)
Lab Results  Component Value Date   HGBA1C 8.0 (A) 05/11/2021   On Lantus 20 U qHS and Glipizide 5 mg QD F/u with Dr [name] CGM showing more than 240 average number for last 2 weeks and 1 month, he has been noncompliant to diabetic diet Advised to follow diabetic diet On statin F/u CMP and lipid panel in the next visit Diabetic eye exam: Advised to continue to follow up with Ophthalmology for diabetic eye exam

## 2021-08-08 NOTE — Assessment & Plan Note (Signed)
BP Readings from Last 1 Encounters:  08/08/21 132/82   Well-controlled with Losartan 50 mg QD Counseled for compliance with the medications Advised DASH diet and moderate exercise/walking, at least 150 mins/week

## 2021-08-25 ENCOUNTER — Ambulatory Visit: Payer: 59

## 2021-08-25 ENCOUNTER — Other Ambulatory Visit: Payer: Self-pay

## 2021-08-25 ENCOUNTER — Ambulatory Visit: Payer: 59 | Admitting: Podiatry

## 2021-08-25 DIAGNOSIS — L97511 Non-pressure chronic ulcer of other part of right foot limited to breakdown of skin: Secondary | ICD-10-CM | POA: Diagnosis not present

## 2021-08-25 DIAGNOSIS — E1142 Type 2 diabetes mellitus with diabetic polyneuropathy: Secondary | ICD-10-CM | POA: Diagnosis not present

## 2021-08-25 DIAGNOSIS — Z794 Long term (current) use of insulin: Secondary | ICD-10-CM

## 2021-08-28 ENCOUNTER — Ambulatory Visit: Payer: 59 | Admitting: "Endocrinology

## 2021-08-29 NOTE — Progress Notes (Signed)
Subjective:  Patient ID: Chad Kirk, male    DOB: 17-May-1961,  MRN: [id number]  Chief Complaint  Patient presents with   Diabetic Ulcer    Right great toe ulcer follow up     61 y.o. male presents with the above complaint.  Patient presents for follow-up of right great toe ulcer distal tip.  Patient is known to Dr. Jake Michaelis and was sent for further evaluation management of ulcer.  He states he is doing well.  He states his big toe is feeling much better no pain.  He is a diabetic with last A1c of 8.0.  He wanted get evaluated.  He thinks the ulcer may have healed.   Review of Systems: Negative except as noted in the HPI. Denies N/V/F/Ch.  Past Medical History:  Diagnosis Date   Diabetes mellitus without complication ([place])    DIAGNOSED AGE 11   Diabetic ulcer of left great toe ([place]) 01/25/2021   Healed diabetic foot ulcer 02/22/2021   Hypertension     Current Outpatient Medications:    amLODipine (NORVASC) 10 MG tablet, Take 1 tablet by mouth once daily, Disp: 90 tablet, Rfl: 0   atropine 1 % ophthalmic solution, SMARTSIG:1 Drop(s) Left Eye Morning-Night, Disp: , Rfl:    benzonatate (TESSALON) 100 MG capsule, Take 1 capsule (100 mg total) by mouth 2 (two) times daily as needed for cough., Disp: 30 capsule, Rfl: 0   blood glucose meter kit and supplies KIT, 1 each by Does not apply route 4 (four) times daily. Dispense based on patient and insurance preference. Use up to four times daily as directed., Disp: 1 each, Rfl: 0   Blood Glucose Monitoring Suppl (ACCU-CHEK GUIDE ME) w/Device KIT, 1 Piece by Does not apply route as directed., Disp: 1 kit, Rfl: 0   Carboxymethylcellulose Sodium (LUBRICANT EYE DROPS OP), Apply 1 Container to eye as needed (dry eye). Left eye, Disp: , Rfl:    Continuous Blood Gluc Receiver (FREESTYLE LIBRE 2 READER) DEVI, As directed, Disp: 1 each, Rfl: 0   Continuous Blood Gluc Sensor (FREESTYLE LIBRE 2 SENSOR) MISC, USE AS DIRECTED TO CHECK BLOOD GLUCOSE CHANGE   SENSOR  EVERY  14  DAYS, Disp: 2 each, Rfl: 2   dorzolamide (TRUSOPT) 2 % ophthalmic solution, Place 1 drop into the left eye 3 (three) times daily., Disp: , Rfl:    finasteride (PROSCAR) 5 MG tablet, Take 1 tablet (5 mg total) by mouth daily., Disp: 90 tablet, Rfl: 3   glipiZIDE (GLUCOTROL XL) 5 MG 24 hr tablet, Take 1 tablet by mouth once daily with breakfast, Disp: 90 tablet, Rfl: 0   glucose blood (ONETOUCH VERIO) test strip, USE 1 STRIP TO CHECK GLUCOSE UP TO 2 TIMES DAILY, Disp: 100 each, Rfl: 2   Insulin Pen Needle (B-D ULTRAFINE III SHORT PEN) 31G X 8 MM MISC, 1 each by Does not apply route as directed., Disp: 100 each, Rfl: 3   Lancets (ONETOUCH DELICA PLUS [id number]) MISC, Apply topically., Disp: , Rfl:    LANTUS SOLOSTAR 100 UNIT/ML Solostar Pen, Inject 20 Units into the skin at bedtime., Disp: 15 mL, Rfl: 2   losartan (COZAAR) 50 MG tablet, Take 1 tablet (50 mg total) by mouth daily., Disp: 90 tablet, Rfl: 3   Multiple Vitamins-Minerals (QC DAILY MULTIVIT/MULTIMINERAL PO), Take 1 tablet by mouth daily., Disp: , Rfl:    prednisoLONE acetate (PRED FORTE) 1 % ophthalmic suspension, 1 drop 4 (four) times daily., Disp: , Rfl:    Probiotic Product (  PROBIOTIC PO), Take 1 tablet by mouth daily., Disp: , Rfl:    rosuvastatin (CRESTOR) 10 MG tablet, Take 1 tablet by mouth once daily, Disp: 90 tablet, Rfl: 0   senna-docusate (SENOKOT-S) 8.6-50 MG tablet, Take 1 tablet by mouth as needed. , Disp: , Rfl:    tadalafil (CIALIS) 20 MG tablet, Take 1 tablet (20 mg total) by mouth daily as needed for erectile dysfunction., Disp: 30 tablet, Rfl: 5   tamsulosin (FLOMAX) 0.4 MG CAPS capsule, TAKE 1 CAPSULE BY MOUTH  DAILY, Disp: 90 capsule, Rfl: 3   timolol (TIMOPTIC) 0.5 % ophthalmic solution, Place 1 drop into both eyes 3 times daily., Disp: , Rfl:    UNABLE TO FIND, Take 5 mLs by mouth every 4 (four) hours as needed. Med Name: Apothecary Cough Syrup Take 5 mL by mouth every 4 to 6 hours as needed for  cough, Disp: 120 mL, Rfl: 0  Social History   Tobacco Use  Smoking Status Former   Packs/day: 1.00   Years: 5.00   Pack years: 5.00   Types: Cigarettes   Quit date: 08/20/1988   Years since quitting: 33.0  Smokeless Tobacco Never    No Known Allergies Objective:  There were no vitals filed for this visit. There is no height or weight on file to calculate BMI. Constitutional Well developed. Well nourished.  Vascular Dorsalis pedis pulses palpable bilaterally. Posterior tibial pulses palpable bilaterally. Capillary refill normal to all digits.  No cyanosis or clubbing noted. Pedal hair growth normal.  Neurologic Normal speech. Oriented to person, place, and time. Epicritic sensation to light touch grossly present bilaterally.  Dermatologic Right hallux blister/ulcer has completely resolved.  No further signs of ulceration noted.  No breakdown of the skin noted.  No clinical signs of infection noted.  Orthopedic: Normal joint ROM without pain or crepitus bilaterally. No visible deformities. No bony tenderness.   Radiographs: None Assessment:   1. Chronic ulcer of right great toe, limited to breakdown of skin ([place])   2. Type 2 diabetes mellitus with diabetic polyneuropathy, with long-term current use of insulin ([place])    Plan:  Patient was evaluated and treated and all questions answered.  Right hallux ulcer limited to the breakdown skin -Clinically healed and very epithelialized.  No clinical signs of infection or recurrence noted.  No ulceration noted.  At this time I discussed shoe gear modification to take the pressure off of the distal tip of the shoe.  He states understanding. -He will return to Dr. Elisha Ponder for routine foot care  No follow-ups on file.

## 2021-09-01 ENCOUNTER — Telehealth: Payer: Self-pay | Admitting: Podiatry

## 2021-09-01 NOTE — Telephone Encounter (Signed)
Per Archie @ [employer] the  diabetic shoes/inserts cpt [phone] is valid and billable covered @ 80% after 2500.00 deductibel(Met 0) out of pocket is 5000.00(met 50.00) the cpt for orthtoics(L3020) is not covered under pts plan.. ref # [id number]

## 2021-09-09 ENCOUNTER — Other Ambulatory Visit: Payer: Self-pay | Admitting: "Endocrinology

## 2021-09-27 ENCOUNTER — Ambulatory Visit: Payer: 59 | Admitting: "Endocrinology

## 2021-10-02 ENCOUNTER — Telehealth: Payer: Self-pay

## 2021-10-02 NOTE — Telephone Encounter (Signed)
Patient calling asking does patient need to get shingle shot now or wait? His wife has shingles.  [phone]

## 2021-10-02 NOTE — Telephone Encounter (Signed)
Pt advised with verbal understanding  °

## 2021-10-05 ENCOUNTER — Ambulatory Visit: Payer: 59 | Admitting: "Endocrinology

## 2021-10-05 ENCOUNTER — Encounter: Payer: Self-pay | Admitting: "Endocrinology

## 2021-10-05 ENCOUNTER — Ambulatory Visit (INDEPENDENT_AMBULATORY_CARE_PROVIDER_SITE_OTHER): Payer: 59

## 2021-10-05 ENCOUNTER — Other Ambulatory Visit: Payer: Self-pay

## 2021-10-05 VITALS — BP 150/96 | HR 80 | Ht 70.0 in | Wt 181.8 lb

## 2021-10-05 DIAGNOSIS — I10 Essential (primary) hypertension: Secondary | ICD-10-CM

## 2021-10-05 DIAGNOSIS — E113592 Type 2 diabetes mellitus with proliferative diabetic retinopathy without macular edema, left eye: Secondary | ICD-10-CM | POA: Diagnosis not present

## 2021-10-05 DIAGNOSIS — E782 Mixed hyperlipidemia: Secondary | ICD-10-CM | POA: Diagnosis not present

## 2021-10-05 DIAGNOSIS — Z794 Long term (current) use of insulin: Secondary | ICD-10-CM

## 2021-10-05 DIAGNOSIS — Z23 Encounter for immunization: Secondary | ICD-10-CM

## 2021-10-05 LAB — POCT GLYCOSYLATED HEMOGLOBIN (HGB A1C): HbA1c, POC (controlled diabetic range): 8.9 % — AB (ref 0.0–7.0)

## 2021-10-05 MED ORDER — FREESTYLE LIBRE 3 SENSOR MISC: 1.0000 | 2 refills | Status: DC

## 2021-10-05 NOTE — Progress Notes (Signed)
10/05/2021, 12:57 PM  Endocrinology follow-up note   Subjective:    Patient ID: Chad Kirk, male    DOB: [birth year].  Chad Kirk is being seen in follow-up after he was seen in consultation for management of currently uncontrolled symptomatic diabetes requested by  [name], MD.   Past Medical History:  Diagnosis Date   Diabetes mellitus without complication ([place])    DIAGNOSED AGE 61   Diabetic ulcer of left great toe ([place]) 01/25/2021   Healed diabetic foot ulcer 02/22/2021   Hypertension     Past Surgical History:  Procedure Laterality Date   ESOPHAGEAL DILATION N/A 12/07/2019   Procedure: ESOPHAGEAL OR PYLORIC DILATION;  Surgeon: Danie Binder, MD;  Location: [facility];  Service: Endoscopy;  Laterality: N/A;   ESOPHAGOGASTRODUODENOSCOPY N/A 12/07/2019   erosive gastritis, many non-bleeding cratered and superficial duodenal ulcers without stigmata of bleeding in duodenal bulb. Empiric dilation due to possible occult esophageal web. Negative H.pylori.    EYE SURGERY  03/02/2020   diabetic retinopathy, lens placement, cataract removal, right eye   HERNIA REPAIR Left 1990    Social History   Socioeconomic History   Marital status: Married    Spouse name: Not on file   Number of children: Not on file   Years of education: Not on file   Highest education level: Not on file  Occupational History   Not on file  Tobacco Use   Smoking status: Former    Packs/day: 1.00    Years: 5.00    Pack years: 5.00    Types: Cigarettes    Quit date: 08/20/1988    Years since quitting: 33.1   Smokeless tobacco: Never  Vaping Use   Vaping Use: Never used  Substance and Sexual Activity   Alcohol use: Not Currently    Comment: denied 03/15/20   Drug use: No   Sexual activity: Not on file  Other Topics Concern   Not on file  Social History Narrative   Not on file   Social Determinants  of Health   Financial Resource Strain: Not on file  Food Insecurity: Not on file  Transportation Needs: Not on file  Physical Activity: Not on file  Stress: Not on file  Social Connections: Not on file    Family History  Problem Relation Age of Onset   Stroke Mother    Hypertension Mother    Diabetes Mother    Hypertension Father    Diabetes Father    Colon cancer Neg Hx    Colon polyps Neg Hx     Outpatient Encounter Medications as of 10/05/2021  Medication Sig   Continuous Blood Gluc Sensor (FREESTYLE LIBRE 3 SENSOR) MISC 1 Piece by Does not apply route every 14 (fourteen) days. Place 1 sensor on the skin every 14 days. Use to check glucose continuously   FUROSEMIDE PO Take by mouth daily.   insulin glargine (LANTUS) 100 UNIT/ML Solostar Pen Inject 30 Units into the skin daily.   amLODipine (NORVASC) 10 MG tablet Take 1 tablet by mouth once daily (Patient not taking: Reported on 10/05/2021)  atropine 1 % ophthalmic solution SMARTSIG:1 Drop(s) Left Eye Morning-Night   benzonatate (TESSALON) 100 MG capsule Take 1 capsule (100 mg total) by mouth 2 (two) times daily as needed for cough. (Patient not taking: Reported on 10/05/2021)   blood glucose meter kit and supplies KIT 1 each by Does not apply route 4 (four) times daily. Dispense based on patient and insurance preference. Use up to four times daily as directed.   Blood Glucose Monitoring Suppl (ACCU-CHEK GUIDE ME) w/Device KIT 1 Piece by Does not apply route as directed.   Carboxymethylcellulose Sodium (LUBRICANT EYE DROPS OP) Apply 1 Container to eye as needed (dry eye). Left eye   Continuous Blood Gluc Receiver (FREESTYLE LIBRE 2 READER) DEVI As directed   Continuous Blood Gluc Sensor (FREESTYLE LIBRE 2 SENSOR) MISC USE AS DIRECTED TO CHECK BLOOD GLUCOSE CHANGE  SENSOR  EVERY  14  DAYS   dorzolamide (TRUSOPT) 2 % ophthalmic solution Place 1 drop into the left eye 3 (three) times daily.   finasteride (PROSCAR) 5 MG tablet Take 1  tablet (5 mg total) by mouth daily.   glipiZIDE (GLUCOTROL XL) 5 MG 24 hr tablet Take 1 tablet by mouth once daily with breakfast   glucose blood (ONETOUCH VERIO) test strip USE 1 STRIP TO CHECK GLUCOSE UP TO 2 TIMES DAILY   Insulin Pen Needle (B-D ULTRAFINE III SHORT PEN) 31G X 8 MM MISC 1 each by Does not apply route as directed.   Lancets (ONETOUCH DELICA PLUS [id number]) MISC Apply topically.   losartan (COZAAR) 50 MG tablet Take 1 tablet (50 mg total) by mouth daily.   Multiple Vitamins-Minerals (QC DAILY MULTIVIT/MULTIMINERAL PO) Take 1 tablet by mouth daily.   prednisoLONE acetate (PRED FORTE) 1 % ophthalmic suspension 1 drop 4 (four) times daily.   Probiotic Product (PROBIOTIC PO) Take 1 tablet by mouth daily.   rosuvastatin (CRESTOR) 10 MG tablet Take 1 tablet by mouth once daily (Patient not taking: Reported on 10/05/2021)   senna-docusate (SENOKOT-S) 8.6-50 MG tablet Take 1 tablet by mouth as needed.    tadalafil (CIALIS) 20 MG tablet Take 1 tablet (20 mg total) by mouth daily as needed for erectile dysfunction.   tamsulosin (FLOMAX) 0.4 MG CAPS capsule TAKE 1 CAPSULE BY MOUTH  DAILY   timolol (TIMOPTIC) 0.5 % ophthalmic solution Place 1 drop into both eyes 3 times daily.   UNABLE TO FIND Take 5 mLs by mouth every 4 (four) hours as needed. Med Name: [facility] Cough Syrup Take 5 mL by mouth every 4 to 6 hours as needed for cough   [DISCONTINUED] LANTUS SOLOSTAR 100 UNIT/ML Solostar Pen Inject 20 Units into the skin at bedtime. (Patient taking differently: Inject 14-20 Units into the skin at bedtime.)   No facility-administered encounter medications on file as of 10/05/2021.    ALLERGIES: No Known Allergies  VACCINATION STATUS: Immunization History  Administered Date(s) Administered   Fluad Quad(high Dose 65+) 06/27/2021   PFIZER(Purple Top)SARS-COV-2 Vaccination 10/17/2019, 11/07/2019   Pneumococcal Conjugate-13 08/08/2021   Pneumococcal Polysaccharide-23 06/30/2020   Zoster  Recombinat (Shingrix) 10/05/2021    Diabetes He presents for his follow-up diabetic visit. He has type 2 diabetes mellitus. Onset time: He was diagnosed at approximate age of 40 years. His disease course has been worsening. Pertinent negatives for hypoglycemia include no confusion, headaches, nervousness/anxiousness, pallor, seizures, sweats or tremors. Pertinent negatives for diabetes include no blurred vision, no chest pain, no fatigue, no polydipsia, no polyphagia, no polyuria and no weakness. There are  no hypoglycemic complications. Symptoms are worsening. Diabetic complications include impotence, nephropathy, peripheral neuropathy, PVD and retinopathy. (He is reporting legal blindness from diabetic retinopathy on both eyes.) Risk factors for coronary artery disease include dyslipidemia, diabetes mellitus, family history, male sex, hypertension and sedentary lifestyle. Current diabetic treatments: He is currently on Lantus 15-20 units 1-2 times a day. His weight is fluctuating minimally. He is following a generally unhealthy diet. When asked about meal planning, he reported none. His home blood glucose trend is increasing steadily. His breakfast blood glucose range is generally 180-200 mg/dl. His lunch blood glucose range is generally 180-200 mg/dl. His dinner blood glucose range is generally 180-200 mg/dl. His bedtime blood glucose range is generally 180-200 mg/dl. His overall blood glucose range is 180-200 mg/dl. (Mr. Lobato presents with his CGM device showing worsening glycemic profile.  He has 58% time range, 41% above range.  He has no sustained hypoglycemia.  His point-of-care A1c is 8.9%, increasing from 8% during his last visit.   ) He does not see a podiatrist.Eye exam is current.  Hyperlipidemia This is a chronic problem. The current episode started more than 1 year ago. The problem is uncontrolled. Recent lipid tests were reviewed and are high. Exacerbating diseases include chronic renal  disease and diabetes. Pertinent negatives include no chest pain, myalgias or shortness of breath. Current antihyperlipidemic treatment includes statins. Risk factors for coronary artery disease include diabetes mellitus, dyslipidemia, hypertension, male sex, a sedentary lifestyle and family history.  Hypertension This is a chronic problem. The current episode started more than 1 year ago. The problem is controlled. Pertinent negatives include no blurred vision, chest pain, headaches, neck pain, palpitations, shortness of breath or sweats. Risk factors for coronary artery disease include dyslipidemia, family history, diabetes mellitus, male gender and sedentary lifestyle. Past treatments include calcium channel blockers. Hypertensive end-organ damage includes kidney disease, PVD and retinopathy. Identifiable causes of hypertension include chronic renal disease.    Review of Systems  Constitutional:  Negative for chills, fatigue, fever and unexpected weight change.  HENT:  Negative for dental problem, mouth sores and trouble swallowing.   Eyes:  Negative for blurred vision and visual disturbance.  Respiratory:  Negative for cough, choking, chest tightness, shortness of breath and wheezing.   Cardiovascular:  Negative for chest pain, palpitations and leg swelling.  Gastrointestinal:  Negative for abdominal distention, abdominal pain, constipation, diarrhea, nausea and vomiting.  Endocrine: Negative for polydipsia, polyphagia and polyuria.  Genitourinary:  Positive for impotence. Negative for dysuria, flank pain, hematuria and urgency.  Musculoskeletal:  Negative for back pain, gait problem, myalgias and neck pain.  Skin:  Negative for pallor, rash and wound.  Neurological:  Negative for tremors, seizures, syncope, weakness, numbness and headaches.  Psychiatric/Behavioral:  Negative for confusion and dysphoric mood. The patient is not nervous/anxious.    Objective:    Vitals with BMI 10/05/2021  08/08/2021 05/11/2021  Height _0  _1  _2   Weight 181 lbs 13 oz 181 lbs 1 oz 183 lbs 13 oz  BMI 26.09 65.03 54.65  Systolic 681 275 170  Diastolic 96 82 86  Pulse 80 82 80    BP (!) 150/96    Pulse 80    Ht _3  (1.778 m)    Wt 181 lb 12.8 oz (82.5 kg)    BMI 26.09 kg/m   Wt Readings from Last 3 Encounters:  10/05/21 181 lb 12.8 oz (82.5 kg)  08/08/21 181 lb 1.3 oz (82.1 kg)  05/11/21 183  lb 12.8 oz (83.4 kg)       CMP ( most recent) CMP     Component Value Date/Time   NA 140 05/02/2021 1131   K 4.6 05/02/2021 1131   CL 104 05/02/2021 1131   CO2 26 05/02/2021 1131   GLUCOSE 101 (H) 05/02/2021 1131   GLUCOSE 256 (H) 12/30/2019 1218   BUN 15 05/02/2021 1131   CREATININE 0.96 05/02/2021 1131   CALCIUM 9.2 05/02/2021 1131   PROT 6.8 05/02/2021 1131   ALBUMIN 4.1 05/02/2021 1131   AST 25 05/02/2021 1131   ALT 38 05/02/2021 1131   ALKPHOS 81 05/02/2021 1131   BILITOT 0.4 05/02/2021 1131   GFRNONAA >60 12/30/2019 1218   GFRAA >60 12/30/2019 1218     Diabetic Labs (most recent): Lab Results  Component Value Date   HGBA1C 8.0 (A) 05/11/2021   HGBA1C 8.9 (A) 02/08/2021   HGBA1C 8.5 (A) 11/07/2020     Lipid Panel ( most recent) Lipid Panel     Component Value Date/Time   CHOL 119 11/02/2020 0859   TRIG 82 11/02/2020 0859   HDL 34 (L) 11/02/2020 0859   CHOLHDL 3.5 11/02/2020 0859   CHOLHDL 4.4 12/30/2019 1218   VLDL 15 12/30/2019 1218   LDLCALC 69 11/02/2020 0859   LABVLDL 16 11/02/2020 0859      Assessment & Plan:   1. Type 2 diabetes, uncontrolled, with retinopathy with macular edema (HCC)  - Markees Carns has currently uncontrolled symptomatic type 2 DM since  61 years of age.  Mr. Chui presents with his CGM device showing worsening glycemic profile.  He has 58% time range, 41% above range.  He has no sustained hypoglycemia.  His point-of-care A1c is 8.9%, increasing from 8% during his last visit.    - I had a long discussion with him about  the progressive nature of diabetes and the pathology behind its complications. -his diabetes is complicated by retinopathy, peripheral arterial disease, peripheral neuropathy and he remains at a high risk for more acute and chronic complications which include CAD, CVA, CKD, retinopathy, and neuropathy. These are all discussed in detail with him.  - I have counseled him on diet  and weight management  by adopting a carbohydrate restricted/protein rich diet. Patient is encouraged to switch to  unprocessed or minimally processed     complex starch and increased protein intake (animal or plant source), fruits, and vegetables. -  he is advised to stick to a routine mealtimes to eat 3 meals  a day and avoid unnecessary snacks ( to snack only to correct hypoglycemia).   - he acknowledges that there is a room for improvement in his food and drink choices. - Suggestion is made for him to avoid simple carbohydrates  from his diet including Cakes, Sweet Desserts, Ice Cream, Soda (diet and regular), Sweet Tea, Candies, Chips, Cookies, Store Bought Juices, Alcohol , Artificial Sweeteners,  Coffee Creamer, and "Sugar-free" Products, Lemonade. This will help patient to have more stable blood glucose profile and potentially avoid unintended weight gain.  The following Lifestyle Medicine recommendations according to [facility]) were discussed and and offered to patient and he  agrees to start the journey:  A. Whole Foods, Plant-Based Nutrition comprising of fruits and vegetables, plant-based proteins, whole-grain carbohydrates was discussed in detail with the patient.   A list for source of those nutrients were also provided to the patient.  Patient will use only water or unsweetened tea  for hydration. B.  The need to stay away from risky substances including alcohol, smoking; obtaining 7 to 9 hours of restorative sleep, at least 150 minutes of moderate intensity exercise weekly, the  importance of healthy social connections,  and stress management techniques were discussed.   - he will be scheduled with Jearld Fenton, RDN, CDE for diabetes education.  - I have approached him with the following individualized plan to manage  his diabetes and patient agrees:   -In light of his presentation with significantly above target glycemic profile, he will need multiple daily injections of insulin in order for him to achieve control of diabetes to target.  He will be considered for NovoLog 70/30 premixed insulin to use twice a day once he finishes his current supplies of Lantus.   - he is encouraged to call clinic for blood glucose levels less than 70 or above 200 mg /dl.  - he is warned not to take insulin without proper monitoring per orders.   -He has responded and benefited from low-dose glipizide.  He is advised to continue glipizide 5 mg XL p.o. daily at breakfast.      - Specific targets for  A1c;  LDL, HDL,  and Triglycerides were discussed with the patient.  2) Blood Pressure /Hypertension: -His blood pressure is not controlled to target. he is advised to continue his current medications including amlodipine 10 mg p.o.. daily with breakfast .  He will be considered for HCTZ/lisinopril next visit.   3) Lipids/Hyperlipidemia:   Review of his recent lipid panel showed un controlled LDL at 149.  He is advised to continue Crestor 10 mg p.o. daily at bedtime.   Side effects and precautions discussed with him.     4)  Weight/Diet:  Body mass index is 26.09 kg/m.  -   he is not a candidate for weight loss. Exercise, and detailed carbohydrates information provided  -  detailed on discharge instructions.  5) Chronic Care/Health Maintenance:  -he  is on  Statin medications and  is encouraged to initiate and continue to follow up with Ophthalmology, Dentist,  Podiatrist at least yearly or according to recommendations, and advised to   stay away from smoking. I have  recommended yearly flu vaccine and pneumonia vaccine at least every 5 years; moderate intensity exercise for up to 150 minutes weekly; and  sleep for at least 7 hours a day.  His screening ABI was normal in September 2021.  He has left lower extremity diabetic foot ulcer, under care with podiatry.    His next study is due in September 2027, or sooner if needed.  - he is  advised to maintain close follow up with his pcp for primary care needs, as well as his other providers for optimal and coordinated care.   I spent 41 minutes in the care of the patient today including review of labs from [facility], Lipids, Thyroid Function, Hematology (current and previous including abstractions from other facilities); face-to-face time discussing  his blood glucose readings/logs, discussing hypoglycemia and hyperglycemia episodes and symptoms, medications doses, his options of short and long term treatment based on the latest standards of care / guidelines;  discussion about incorporating lifestyle medicine;  and documenting the encounter.    Please refer to Patient Instructions for Blood Glucose Monitoring and Insulin/Medications Dosing Guide"  in media tab for additional information. Please  also refer to " Patient Self Inventory" in the Media  tab for reviewed elements of pertinent patient history.  Bradd Canary participated in the discussions, expressed understanding, and voiced agreement with the above plans.  All questions were answered to his satisfaction. he is encouraged to contact clinic should he have any questions or concerns prior to his return visit.    Follow up plan: - Return in about 7 weeks (around 11/23/2021) for F/U with Meter and Logs Only - no Labs.  Glade Lloyd, MD [facility] [address] [place], [place] [address] Phone: [phone]  Fax: [phone]    10/05/2021, 12:57 PM  This note was partially dictated with voice recognition  software. Similar sounding words can be transcribed inadequately or may not  be corrected upon review.

## 2021-10-05 NOTE — Patient Instructions (Signed)

## 2021-10-05 NOTE — Addendum Note (Signed)
Addended by: Ellin Saba on: 10/05/2021 01:22 PM   Modules accepted: Orders

## 2021-10-22 ENCOUNTER — Other Ambulatory Visit: Payer: Self-pay | Admitting: Internal Medicine

## 2021-10-24 ENCOUNTER — Other Ambulatory Visit: Payer: Self-pay

## 2021-10-24 DIAGNOSIS — E113592 Type 2 diabetes mellitus with proliferative diabetic retinopathy without macular edema, left eye: Secondary | ICD-10-CM

## 2021-10-24 MED ORDER — FREESTYLE LIBRE 2 SENSOR MISC: 2 refills | Status: DC

## 2021-10-30 ENCOUNTER — Telehealth: Payer: Self-pay

## 2021-10-30 NOTE — Telephone Encounter (Signed)
Patient called about medicine tadalafil (CIALIS) 20 MG tablet, he was cutting them in half and only taking 1/2 when needed.   ? ?Can Dr Allena Katz up the dose on the medicine, needs enough to get patient through 3  months. ? ?Pharmacy: Pharmacy ? ?[facility] ([facility] ) - [place], [place] - [phone]  ?[address], [place]  [address]  ?Phone:  [phone]  Fax:  [phone]   ? ?

## 2021-10-31 ENCOUNTER — Other Ambulatory Visit: Payer: Self-pay | Admitting: *Deleted

## 2021-10-31 DIAGNOSIS — N529 Male erectile dysfunction, unspecified: Secondary | ICD-10-CM

## 2021-10-31 MED ORDER — TADALAFIL 20 MG PO TABS: 20.0000 mg | ORAL_TABLET | Freq: Every day | ORAL | 5 refills | Status: DC | PRN

## 2021-11-06 NOTE — Telephone Encounter (Signed)
Pt refill sent in as he said that he would wait for changing of dose until provider was back in office  ?

## 2021-11-07 ENCOUNTER — Encounter: Payer: Self-pay | Admitting: Podiatry

## 2021-11-07 ENCOUNTER — Ambulatory Visit (INDEPENDENT_AMBULATORY_CARE_PROVIDER_SITE_OTHER): Payer: 59 | Admitting: Podiatry

## 2021-11-07 ENCOUNTER — Other Ambulatory Visit: Payer: Self-pay

## 2021-11-07 DIAGNOSIS — E1142 Type 2 diabetes mellitus with diabetic polyneuropathy: Secondary | ICD-10-CM

## 2021-11-07 DIAGNOSIS — L84 Corns and callosities: Secondary | ICD-10-CM | POA: Diagnosis not present

## 2021-11-07 DIAGNOSIS — Z794 Long term (current) use of insulin: Secondary | ICD-10-CM | POA: Diagnosis not present

## 2021-11-07 DIAGNOSIS — S90229A Contusion of unspecified lesser toe(s) with damage to nail, initial encounter: Secondary | ICD-10-CM

## 2021-11-07 DIAGNOSIS — M79675 Pain in left toe(s): Secondary | ICD-10-CM | POA: Diagnosis not present

## 2021-11-07 DIAGNOSIS — B351 Tinea unguium: Secondary | ICD-10-CM

## 2021-11-07 DIAGNOSIS — L608 Other nail disorders: Secondary | ICD-10-CM

## 2021-11-07 DIAGNOSIS — M79674 Pain in right toe(s): Secondary | ICD-10-CM

## 2021-11-07 NOTE — Progress Notes (Signed)
?  Subjective:  ?Patient ID: Chad Kirk, male    DOB: 31-Dec-1960,  MRN: [id number] ? ?Chad Kirk presents to clinic today for at risk foot care with history of diabetic neuropathy and callus(es) b/l feet and painful thick toenails that are difficult to trim. Painful toenails interfere with ambulation. Aggravating factors include wearing enclosed shoe gear. Pain is relieved with periodic professional debridement. Painful calluses are aggravated when weightbearing with and without shoegear. Pain is relieved with periodic professional debridement. ? ?Patient states blood glucose was 144 mg/dl today.  Last HgA1c was 8.0%. ? ?New problem(s): Patient notified toenail is missing from his right 2nd digit. He was unaware of it. He relates no episodes of trauma such as stubbing his toes. ? ?PCP is Anabel Halon, MD , and last visit was August 08, 2021. ? ?No Known Allergies ? ?Review of Systems: Negative except as noted in the HPI. ? ?Objective: No changes noted in today's physical examination. ?Allah Reason is a pleasant 61 y.o. male, WD, WN in NAD. AAO X 3. ? ?Vascular Examination: ?CFT <3 seconds b/l LE. Faintly palpable DP pulses b/l. Nonpalpable PT pulses b/l. Pedal hair absent b/l. Skin temperature gradient WNL b/l. No pain with calf compression b/l. No edema b/l LE. No cyanosis or clubbing noted b/l LE. ? ?Dermatological Examination: ?Pedal integument with normal turgor, texture and tone BLE. No open wounds b/l LE. No interdigital macerations noted b/l LE. Toenails 1-5  left, 1, 3-5 right elongated, discolored, dystrophic, thickened, crumbly with subungual debris and tenderness to dorsal palpation. Anonychia noted right 2nd digit with dried heme and nailbed intact. No erythema, no edema, no drainage, no fluctuance . Old subungual hematoma noted right 4th digit. No erythema, no edema, no drainage, no fluctuance. Nailplate remains adhered to nailbed. Hyperkeratotic lesion(s) right great toe. Preulcerative callus  plantar left hallux with subdermal hemorrhage. No erythema, no edema, no drainage, no fluctuance. ? ?Musculoskeletal Examination: ?Normal muscle strength 5/5 to all lower extremity muscle groups bilaterally. HAV with bunion deformity noted b/l LE. Hammertoe deformity noted 2-5 b/l.Marland Kitchen No pain, crepitus or joint limitation noted with ROM b/l LE.  Patient ambulates independently without assistive aids. ? ?Neurological Examination: ?Protective sensation diminished with 10g monofilament b/l. ? ?Hemoglobin A1C Latest Ref Rng & Units 10/05/2021 05/11/2021 02/08/2021 11/07/2020  ?HGBA1C 0.0 - 7.0 % 8.9(A) 8.0(A) 8.9(A) 8.5(A)  ?Some recent data might be hidden  ? ?Assessment/Plan: ?1. Pain due to onychomycosis of toenails of both feet   ?2. Callus   ?3. Acquired anonychia of toe   ?4. Subungual hematoma of fourth toe   ?5. Pre-ulcerative calluses   ?6. Type 2 diabetes mellitus with diabetic polyneuropathy, with long-term current use of insulin (HCC)   ?  ?-Examined patient. ?-No treatment required for right 4th and right 3rd digit. Patient informed new nail plate will grow back in 3-4 months. ?-Toenails 1-5 left foot, 3-5 right foot, and R hallux debrided in length and girth without iatrogenic bleeding with sterile nail nipper and dremel.  ?-Callus(es) R hallux pared utilizing sterile scalpel blade without complication or incident. Total number debrided =1. ?-Preulcerative lesion pared L hallux. Total number pared=1. ?-Patient/POA to call should there be question/concern in the interim.  ? ?Return in about 3 months (around 02/07/2022). ? ?Freddie Breech, DPM  ?

## 2021-11-07 NOTE — Telephone Encounter (Signed)
Pt is taken care of does not need anything further ?

## 2021-11-20 ENCOUNTER — Other Ambulatory Visit: Payer: Self-pay | Admitting: "Endocrinology

## 2021-11-20 NOTE — Telephone Encounter (Signed)
Pt's last visit note stated possibly changing him to Novolog 70/30 premix once he runs out of Lantus. ?

## 2021-11-23 ENCOUNTER — Ambulatory Visit (INDEPENDENT_AMBULATORY_CARE_PROVIDER_SITE_OTHER): Payer: 59

## 2021-11-23 ENCOUNTER — Encounter: Payer: Self-pay | Admitting: "Endocrinology

## 2021-11-23 ENCOUNTER — Ambulatory Visit: Payer: 59 | Admitting: "Endocrinology

## 2021-11-23 VITALS — BP 152/98 | HR 88 | Ht 70.0 in | Wt 183.4 lb

## 2021-11-23 DIAGNOSIS — Z794 Long term (current) use of insulin: Secondary | ICD-10-CM | POA: Diagnosis not present

## 2021-11-23 DIAGNOSIS — E113592 Type 2 diabetes mellitus with proliferative diabetic retinopathy without macular edema, left eye: Secondary | ICD-10-CM | POA: Diagnosis not present

## 2021-11-23 DIAGNOSIS — Z23 Encounter for immunization: Secondary | ICD-10-CM

## 2021-11-23 DIAGNOSIS — I10 Essential (primary) hypertension: Secondary | ICD-10-CM

## 2021-11-23 DIAGNOSIS — E782 Mixed hyperlipidemia: Secondary | ICD-10-CM

## 2021-11-23 MED ORDER — LANTUS SOLOSTAR 100 UNIT/ML ~~LOC~~ SOPN: PEN_INJECTOR | SUBCUTANEOUS | 2 refills | Status: DC

## 2021-11-23 MED ORDER — FREESTYLE LIBRE 2 SENSOR MISC: 2 refills | Status: DC

## 2021-11-23 NOTE — Progress Notes (Signed)
? ?                                                             ?     11/23/2021, 4:40 PM ? ?Endocrinology follow-up note ? ? ?Subjective:  ? ? Patient ID: Chad Kirk, male    DOB: 1961/04/10.  ?Chad Kirk is being seen in follow-up after he was seen in consultation for management of currently uncontrolled symptomatic diabetes requested by  [name], MD. ? ? ?Past Medical History:  ?Diagnosis Date  ? Diabetes mellitus without complication ([place])   ? DIAGNOSED AGE 51  ? Diabetic ulcer of left great toe ([place]) 01/25/2021  ? Healed diabetic foot ulcer 02/22/2021  ? Hypertension   ? ? ?Past Surgical History:  ?Procedure Laterality Date  ? ESOPHAGEAL DILATION N/A 12/07/2019  ? Procedure: ESOPHAGEAL OR PYLORIC DILATION;  Surgeon: Danie Binder, MD;  [facility]: [facility];  Service: Endoscopy;  Laterality: N/A;  ? ESOPHAGOGASTRODUODENOSCOPY N/A 12/07/2019  ? erosive gastritis, many non-bleeding cratered and superficial duodenal ulcers without stigmata of bleeding in duodenal bulb. Empiric dilation due to possible occult esophageal web. Negative H.pylori.   ? EYE SURGERY  03/02/2020  ? diabetic retinopathy, lens placement, cataract removal, right eye  ? HERNIA REPAIR Left 1990  ? ? ?Social History  ? ?Socioeconomic History  ? Marital status: Married  ?  Spouse name: Not on file  ? Number of children: Not on file  ? Years of education: Not on file  ? Highest education level: Not on file  ?Occupational History  ? Not on file  ?Tobacco Use  ? Smoking status: Former  ?  Packs/day: 1.00  ?  Years: 5.00  ?  Pack years: 5.00  ?  Types: Cigarettes  ?  Quit date: 08/20/1988  ?  Years since quitting: 33.2  ? Smokeless tobacco: Never  ?Vaping Use  ? Vaping Use: Never used  ?Substance and Sexual Activity  ? Alcohol use: Not Currently  ?  Comment: denied 03/15/20  ? Drug use: No  ? Sexual activity: Not on file  ?Other Topics Concern  ? Not on file  ?Social History Narrative  ? Not on file  ? ?Social Determinants of  Health  ? ?Financial Resource Strain: Not on file  ?Food Insecurity: Not on file  ?Transportation Needs: Not on file  ?Physical Activity: Not on file  ?Stress: Not on file  ?Social Connections: Not on file  ? ? ?Family History  ?Problem Relation Age of Onset  ? Stroke Mother   ? Hypertension Mother   ? Diabetes Mother   ? Hypertension Father   ? Diabetes Father   ? Colon cancer Neg Hx   ? Colon polyps Neg Hx   ? ? ?Outpatient Encounter Medications as of 11/23/2021  ?Medication Sig  ? amLODipine (NORVASC) 10 MG tablet Take 1 tablet by mouth once daily  ? atropine 1 % ophthalmic solution SMARTSIG:1 Drop(s) Left Eye Morning-Night  ? blood glucose meter kit and supplies KIT 1 each by Does not apply route 4 (four) times daily. Dispense based on patient and insurance preference. Use up to four times daily as directed.  ? Blood Glucose Monitoring Suppl (ACCU-CHEK GUIDE ME) w/Device KIT 1 Piece by Does not apply route as directed.  ?  Carboxymethylcellulose Sodium (LUBRICANT EYE DROPS OP) Apply 1 Container to eye as needed (dry eye). Left eye  ? Continuous Blood Gluc Receiver (FREESTYLE LIBRE 2 READER) DEVI As directed  ? Continuous Blood Gluc Sensor (FREESTYLE LIBRE 2 SENSOR) MISC Change sensor every 14 days  ? dorzolamide (TRUSOPT) 2 % ophthalmic solution Place 1 drop into the left eye 3 (three) times daily.  ? finasteride (PROSCAR) 5 MG tablet Take 1 tablet (5 mg total) by mouth daily.  ? FUROSEMIDE PO Take by mouth daily.  ? GLIPIZIDE XL 5 MG 24 hr tablet TAKE 1 TABLET BY MOUTH  DAILY WITH BREAKFAST  ? glucose blood (ONETOUCH VERIO) test strip USE 1 STRIP TO CHECK GLUCOSE UP TO 2 TIMES DAILY  ? Insulin Pen Needle (B-D ULTRAFINE III SHORT PEN) 31G X 8 MM MISC 1 each by Does not apply route as directed.  ? Lancets (ONETOUCH DELICA PLUS [id number]) MISC Apply topically.  ? LANTUS SOLOSTAR 100 UNIT/ML Solostar Pen INJECT 20 UNITS SUBCUTANEOUSLY AT BEDTIME  ? losartan (COZAAR) 50 MG tablet Take 1 tablet (50 mg total) by mouth  daily.  ? Multiple Vitamins-Minerals (QC DAILY MULTIVIT/MULTIMINERAL PO) Take 1 tablet by mouth daily.  ? prednisoLONE acetate (PRED FORTE) 1 % ophthalmic suspension 1 drop 4 (four) times daily.  ? Probiotic Product (PROBIOTIC PO) Take 1 tablet by mouth daily.  ? senna-docusate (SENOKOT-S) 8.6-50 MG tablet Take 1 tablet by mouth as needed.   ? tadalafil (CIALIS) 20 MG tablet Take 1 tablet (20 mg total) by mouth daily as needed for erectile dysfunction.  ? tamsulosin (FLOMAX) 0.4 MG CAPS capsule TAKE 1 CAPSULE BY MOUTH  DAILY  ? timolol (TIMOPTIC) 0.5 % ophthalmic solution Place 1 drop into both eyes 3 times daily.  ? timolol (TIMOPTIC) 0.5 % ophthalmic solution Place 1 drop into both eyes 3 times daily.  ? UNABLE TO FIND Take 5 mLs by mouth every 4 (four) hours as needed. Med Name: Apothecary Cough Syrup ?Take 5 mL by mouth every 4 to 6 hours as needed for cough  ? [DISCONTINUED] benzonatate (TESSALON) 100 MG capsule Take 1 capsule (100 mg total) by mouth 2 (two) times daily as needed for cough. (Patient not taking: Reported on 10/05/2021)  ? [DISCONTINUED] Continuous Blood Gluc Sensor (FREESTYLE LIBRE 2 SENSOR) MISC Change sensor every 14 days  ? [DISCONTINUED] LANTUS SOLOSTAR 100 UNIT/ML Solostar Pen INJECT 20 UNITS SUBCUTANEOUSLY AT BEDTIME  ? [DISCONTINUED] rosuvastatin (CRESTOR) 10 MG tablet Take 1 tablet by mouth once daily (Patient not taking: Reported on 10/05/2021)  ? ?No facility-administered encounter medications on file as of 11/23/2021.  ? ? ?ALLERGIES: ?No Known Allergies ? ?VACCINATION STATUS: ?Immunization History  ?Administered Date(s) Administered  ? Fluad Quad(high Dose 65+) 06/27/2021  ? PFIZER(Purple Top)SARS-COV-2 Vaccination 10/17/2019, 11/07/2019  ? Pneumococcal Conjugate-13 08/08/2021  ? Pneumococcal Polysaccharide-23 06/30/2020  ? Zoster Recombinat (Shingrix) 10/05/2021, 11/23/2021  ? ? ?Diabetes ?He presents for his follow-up diabetic visit. He has type 2 diabetes mellitus. Onset time: He was  diagnosed at approximate age of 61 years. His disease course has been improving. Pertinent negatives for hypoglycemia include no confusion, headaches, nervousness/anxiousness, pallor, seizures, sweats or tremors. Pertinent negatives for diabetes include no blurred vision, no chest pain, no fatigue, no polydipsia, no polyphagia, no polyuria and no weakness. There are no hypoglycemic complications. Symptoms are improving. Diabetic complications include impotence, nephropathy, peripheral neuropathy, PVD and retinopathy. (He is reporting legal blindness from diabetic retinopathy on both eyes.) Risk factors for coronary artery disease  include dyslipidemia, diabetes mellitus, family history, male sex, hypertension and sedentary lifestyle. Current diabetic treatments: He is currently on Lantus 15-20 units 1-2 times a day. His weight is fluctuating minimally. He is following a generally unhealthy diet. When asked about meal planning, he reported none. His home blood glucose trend is decreasing steadily. His breakfast blood glucose range is generally 140-180 mg/dl. His lunch blood glucose range is generally 140-180 mg/dl. His dinner blood glucose range is generally 140-180 mg/dl. His bedtime blood glucose range is generally 140-180 mg/dl. His overall blood glucose range is 140-180 mg/dl. (Mr. Leja presents with his CGM device showing improving  glycemic profile.  He has 57% time range, 34 % level 1 hyperglycemia, 8 % level 2 hyperglycemia, 1% level 1 hypoglycemia. ?  His point-of-care A1c is  7.4% improving from 8.9%, . His EAG for the recent 14 days is 169.) He does not see a podiatrist.Eye exam is current.  ?Hyperlipidemia ?This is a chronic problem. The current episode started more than 1 year ago. The problem is uncontrolled. Recent lipid tests were reviewed and are high. Exacerbating diseases include chronic renal disease and diabetes. Pertinent negatives include no chest pain, myalgias or shortness of breath. Current  antihyperlipidemic treatment includes statins. Risk factors for coronary artery disease include diabetes mellitus, dyslipidemia, hypertension, male sex, a sedentary lifestyle and family history.  ?Hype

## 2021-11-23 NOTE — Patient Instructions (Signed)

## 2021-12-12 ENCOUNTER — Encounter: Payer: Self-pay | Admitting: Internal Medicine

## 2021-12-12 ENCOUNTER — Ambulatory Visit (INDEPENDENT_AMBULATORY_CARE_PROVIDER_SITE_OTHER): Payer: 59 | Admitting: Internal Medicine

## 2021-12-12 VITALS — BP 138/98 | HR 92 | Resp 18 | Ht 69.0 in | Wt 182.4 lb

## 2021-12-12 DIAGNOSIS — Z794 Long term (current) use of insulin: Secondary | ICD-10-CM

## 2021-12-12 DIAGNOSIS — I10 Essential (primary) hypertension: Secondary | ICD-10-CM | POA: Diagnosis not present

## 2021-12-12 DIAGNOSIS — E113592 Type 2 diabetes mellitus with proliferative diabetic retinopathy without macular edema, left eye: Secondary | ICD-10-CM | POA: Diagnosis not present

## 2021-12-12 DIAGNOSIS — Z0001 Encounter for general adult medical examination with abnormal findings: Secondary | ICD-10-CM | POA: Diagnosis not present

## 2021-12-12 MED ORDER — LOSARTAN POTASSIUM 100 MG PO TABS: 100.0000 mg | ORAL_TABLET | Freq: Every day | ORAL | 1 refills | Status: DC

## 2021-12-12 NOTE — Patient Instructions (Signed)
Please start taking Losartan 100 mg instead of 50 mg. ? ?Please continue taking insulin as prescribed. ? ?Please continue to follow low carb diet and perform moderate exercise/walking as tolerated. ?

## 2021-12-12 NOTE — Assessment & Plan Note (Signed)

## 2021-12-12 NOTE — Assessment & Plan Note (Signed)
Lab Results  ?Component Value Date  ? HGBA1C 8.9 (A) 10/05/2021  ? ?On Lantus 20 U qHS and Glipizide 5 mg QD ?F/u with Dr [name] ?noncompliant to insulin regimen, advised to take Lantus 20 U regularly ?Advised to follow diabetic diet ?On statin ?F/u CMP and lipid panel in the next visit ?Diabetic eye exam: Advised to continue to follow up with Ophthalmology for diabetic eye exam ?

## 2021-12-12 NOTE — Assessment & Plan Note (Signed)
BP Readings from Last 1 Encounters:  ?12/12/21 (!) 138/98  ? ?Uncontrolled with Losartan 50 mg QD, increased dose to 100 mg daily ?Counseled for compliance with the medications ?Advised DASH diet and moderate exercise/walking, at least 150 mins/week ?

## 2021-12-12 NOTE — Progress Notes (Signed)
? ?Established Patient Office Visit ? ?Subjective:  ?Patient ID: Chad Kirk, male    DOB: 31-Jul-1961  Age: 61 y.o. MRN: [id number] ? ?CC:  ?Chief Complaint  ?Patient presents with  ? Annual Exam  ?  Annual exam   ? ? ?HPI ?Chad Kirk is a 61 y.o. male with past medical history of diabetes mellitus type 2, PVD, hypertension, hyperlipidemia, diabetic macular edema, BPH and alcohol abuse who presents for annual physical. ? ?HTN: BP is elevated today.  His BP has been elevated during recent office visit. Takes medications regularly - Losartan 50 mg QD. Patient denies headache, dizziness, chest pain, dyspnea or palpitations. ? ?DM: He follows up with Dr. Dorris Fetch for diabetes management.  He takes Lantus regularly, but takes 20 U on some days and 14U on some days.  He is advised to take Lantus 20 U regularly and follow diabetic diet as instructed. He follows up with ophthalmologist for diabetic macular edema and his vision has improved now. Denies any polyuria, polyphagia or fatigue. ? ?Past Medical History:  ?Diagnosis Date  ? Diabetes mellitus without complication ([place])   ? DIAGNOSED AGE 11  ? Diabetic ulcer of left great toe ([place]) 01/25/2021  ? Healed diabetic foot ulcer 02/22/2021  ? Hypertension   ? ? ?Past Surgical History:  ?Procedure Laterality Date  ? ESOPHAGEAL DILATION N/A 12/07/2019  ? Procedure: ESOPHAGEAL OR PYLORIC DILATION;  Surgeon: Danie Binder, MD;  [facility];  Service: Endoscopy;  Laterality: N/A;  ? ESOPHAGOGASTRODUODENOSCOPY N/A 12/07/2019  ? erosive gastritis, many non-bleeding cratered and superficial duodenal ulcers without stigmata of bleeding in duodenal bulb. Empiric dilation due to possible occult esophageal web. Negative H.pylori.   ? EYE SURGERY  03/02/2020  ? diabetic retinopathy, lens placement, cataract removal, right eye  ? HERNIA REPAIR Left 1990  ? ? ?Family History  ?Problem Relation Age of Onset  ? Stroke Mother   ? Hypertension Mother   ? Diabetes Mother   ?  Hypertension Father   ? Diabetes Father   ? Colon cancer Neg Hx   ? Colon polyps Neg Hx   ? ? ?Social History  ? ?Socioeconomic History  ? Marital status: Married  ?  Spouse name: Not on file  ? Number of children: Not on file  ? Years of education: Not on file  ? Highest education level: Not on file  ?Occupational History  ? Not on file  ?Tobacco Use  ? Smoking status: Former  ?  Packs/day: 1.00  ?  Years: 5.00  ?  Pack years: 5.00  ?  Types: Cigarettes  ?  Quit date: 08/20/1988  ?  Years since quitting: 33.3  ? Smokeless tobacco: Never  ?Vaping Use  ? Vaping Use: Never used  ?Substance and Sexual Activity  ? Alcohol use: Not Currently  ?  Comment: denied 03/15/20  ? Drug use: No  ? Sexual activity: Not on file  ?Other Topics Concern  ? Not on file  ?Social History Narrative  ? Not on file  ? ?Social Determinants of Health  ? ?Financial Resource Strain: Not on file  ?Food Insecurity: Not on file  ?Transportation Needs: Not on file  ?Physical Activity: Not on file  ?Stress: Not on file  ?Social Connections: Not on file  ?Intimate Partner Violence: Not on file  ? ? ?Outpatient Medications Prior to Visit  ?Medication Sig Dispense Refill  ? atropine 1 % ophthalmic solution SMARTSIG:1 Drop(s) Left Eye Morning-Night    ?  blood glucose meter kit and supplies KIT 1 each by Does not apply route 4 (four) times daily. Dispense based on patient and insurance preference. Use up to four times daily as directed. 1 each 0  ? Blood Glucose Monitoring Suppl (ACCU-CHEK GUIDE ME) w/Device KIT 1 Piece by Does not apply route as directed. 1 kit 0  ? Carboxymethylcellulose Sodium (LUBRICANT EYE DROPS OP) Apply 1 Container to eye as needed (dry eye). Left eye    ? Continuous Blood Gluc Receiver (FREESTYLE LIBRE 2 READER) DEVI As directed 1 each 0  ? Continuous Blood Gluc Sensor (FREESTYLE LIBRE 2 SENSOR) MISC Change sensor every 14 days 2 each 2  ? dorzolamide (TRUSOPT) 2 % ophthalmic solution Place 1 drop into the left eye 3 (three) times  daily.    ? finasteride (PROSCAR) 5 MG tablet Take 1 tablet (5 mg total) by mouth daily. 90 tablet 3  ? FUROSEMIDE PO Take by mouth daily.    ? GLIPIZIDE XL 5 MG 24 hr tablet TAKE 1 TABLET BY MOUTH  DAILY WITH BREAKFAST 90 tablet 3  ? glucose blood (ONETOUCH VERIO) test strip USE 1 STRIP TO CHECK GLUCOSE UP TO 2 TIMES DAILY 100 each 2  ? Insulin Pen Needle (B-D ULTRAFINE III SHORT PEN) 31G X 8 MM MISC 1 each by Does not apply route as directed. 100 each 3  ? Lancets (ONETOUCH DELICA PLUS [id number]) MISC Apply topically.    ? LANTUS SOLOSTAR 100 UNIT/ML Solostar Pen INJECT 20 UNITS SUBCUTANEOUSLY AT BEDTIME 15 mL 2  ? Multiple Vitamins-Minerals (QC DAILY MULTIVIT/MULTIMINERAL PO) Take 1 tablet by mouth daily.    ? prednisoLONE acetate (PRED FORTE) 1 % ophthalmic suspension 1 drop 4 (four) times daily.    ? Probiotic Product (PROBIOTIC PO) Take 1 tablet by mouth daily.    ? senna-docusate (SENOKOT-S) 8.6-50 MG tablet Take 1 tablet by mouth as needed.     ? tadalafil (CIALIS) 20 MG tablet Take 1 tablet (20 mg total) by mouth daily as needed for erectile dysfunction. 30 tablet 5  ? tamsulosin (FLOMAX) 0.4 MG CAPS capsule TAKE 1 CAPSULE BY MOUTH  DAILY 90 capsule 3  ? timolol (TIMOPTIC) 0.5 % ophthalmic solution Place 1 drop into both eyes 3 times daily.    ? timolol (TIMOPTIC) 0.5 % ophthalmic solution Place 1 drop into both eyes 3 times daily.    ? UNABLE TO FIND Take 5 mLs by mouth every 4 (four) hours as needed. Med Name: Apothecary Cough Syrup ?Take 5 mL by mouth every 4 to 6 hours as needed for cough 120 mL 0  ? amLODipine (NORVASC) 10 MG tablet Take 1 tablet by mouth once daily 90 tablet 0  ? losartan (COZAAR) 50 MG tablet Take 1 tablet (50 mg total) by mouth daily. 90 tablet 3  ? ?No facility-administered medications prior to visit.  ? ? ?No Known Allergies ? ?ROS ?Review of Systems  ?Constitutional:  Negative for chills and fever.  ?HENT:  Negative for congestion and sore throat.   ?Eyes:  Positive for visual  disturbance. Negative for pain and discharge.  ?Respiratory:  Negative for cough and shortness of breath.   ?Cardiovascular:  Negative for chest pain and palpitations.  ?Gastrointestinal:  Negative for constipation, diarrhea, nausea and vomiting.  ?Endocrine: Negative for polydipsia and polyuria.  ?Genitourinary:  Negative for dysuria and hematuria.  ?Musculoskeletal:  Negative for neck pain and neck stiffness.  ?Skin:  Negative for rash.  ?Neurological:  Negative for  dizziness, weakness, numbness and headaches.  ?Psychiatric/Behavioral:  Negative for agitation and behavioral problems.   ? ?  ?Objective:  ?  ?Physical Exam ?Vitals reviewed.  ?Constitutional:   ?   General: He is not in acute distress. ?   Appearance: He is not diaphoretic.  ?HENT:  ?   Head: Normocephalic and atraumatic.  ?   Nose: Nose normal.  ?   Mouth/Throat:  ?   Mouth: Mucous membranes are moist.  ?Eyes:  ?   General: No scleral icterus. ?   Extraocular Movements: Extraocular movements intact.  ?   Pupils: Pupils are equal, round, and reactive to light.  ?Cardiovascular:  ?   Rate and Rhythm: Normal rate and regular rhythm.  ?   Heart sounds: Normal heart sounds. No murmur heard. ?   Comments: DPA pulse 1+ b/l ?Pulmonary:  ?   Breath sounds: Normal breath sounds. No wheezing or rales.  ?Abdominal:  ?   Palpations: Abdomen is soft.  ?   Tenderness: There is no abdominal tenderness.  ?Musculoskeletal:  ?   Cervical back: Neck supple. No tenderness.  ?   Right lower leg: No edema.  ?   Left lower leg: No edema.  ?Skin: ?   General: Skin is warm.  ?   Findings: No rash.  ?   Comments: No ulcer or skin breakdown on feet  ?Neurological:  ?   General: No focal deficit present.  ?   Mental Status: He is alert and oriented to person, place, and time.  ?   Sensory: No sensory deficit.  ?   Motor: No weakness.  ?Psychiatric:     ?   Mood and Affect: Mood normal.     ?   Behavior: Behavior normal.  ? ? ?BP (!) 138/98 (BP Location: Right Arm, Cuff Size:  Normal)   Pulse 92   Resp 18   Ht _0  (1.753 m)   Wt 182 lb 6.4 oz (82.7 kg)   SpO2 98%   BMI 26.94 kg/m?  ?Wt Readings from Last 3 Encounters:  ?12/12/21 182 lb 6.4 oz (82.7 kg)  ?11/23/21 183 lb 6.4 oz (83.2 kg)

## 2021-12-13 ENCOUNTER — Other Ambulatory Visit: Payer: Self-pay

## 2021-12-13 ENCOUNTER — Other Ambulatory Visit: Payer: Self-pay | Admitting: Internal Medicine

## 2021-12-13 DIAGNOSIS — E782 Mixed hyperlipidemia: Secondary | ICD-10-CM

## 2021-12-13 DIAGNOSIS — N400 Enlarged prostate without lower urinary tract symptoms: Secondary | ICD-10-CM

## 2021-12-13 LAB — COMPREHENSIVE METABOLIC PANEL
ALT: 24 IU/L (ref 0–44)
AST: 20 IU/L (ref 0–40)
Albumin/Globulin Ratio: 1.3 (ref 1.2–2.2)
Albumin: 4.2 g/dL (ref 3.8–4.8)
Alkaline Phosphatase: 78 IU/L (ref 44–121)
BUN/Creatinine Ratio: 17 (ref 10–24)
BUN: 21 mg/dL (ref 8–27)
Bilirubin Total: <0.2 mg/dL (ref 0.0–1.2)
CO2: 21 mmol/L (ref 20–29)
Calcium: 9.8 mg/dL (ref 8.6–10.2)
Chloride: 102 mmol/L (ref 96–106)
Creatinine, Ser: 1.23 mg/dL (ref 0.76–1.27)
Globulin, Total: 3.3 g/dL (ref 1.5–4.5)
Glucose: 195 mg/dL — ABNORMAL HIGH (ref 70–99)
Potassium: 5 mmol/L (ref 3.5–5.2)
Sodium: 139 mmol/L (ref 134–144)
Total Protein: 7.5 g/dL (ref 6.0–8.5)
eGFR: 67 mL/min/{1.73_m2} (ref >59)

## 2021-12-13 LAB — CBC WITH DIFFERENTIAL/PLATELET
Basophils Absolute: 0 10*3/uL (ref 0.0–0.2)
Basos: 1 %
EOS (ABSOLUTE): 0.1 10*3/uL (ref 0.0–0.4)
Eos: 1 %
Hematocrit: 42.9 % (ref 37.5–51.0)
Hemoglobin: 14 g/dL (ref 13.0–17.7)
Immature Grans (Abs): 0 10*3/uL (ref 0.0–0.1)
Immature Granulocytes: 0 %
Lymphocytes Absolute: 1.7 10*3/uL (ref 0.7–3.1)
Lymphs: 28 %
MCH: 26.5 pg — ABNORMAL LOW (ref 26.6–33.0)
MCHC: 32.6 g/dL (ref 31.5–35.7)
MCV: 81 fL (ref 79–97)
Monocytes Absolute: 0.7 10*3/uL (ref 0.1–0.9)
Monocytes: 11 %
Neutrophils Absolute: 3.6 10*3/uL (ref 1.4–7.0)
Neutrophils: 59 %
Platelets: 214 10*3/uL (ref 150–450)
RBC: 5.28 x10E6/uL (ref 4.14–5.80)
RDW: 12.7 % (ref 11.6–15.4)
WBC: 6.1 10*3/uL (ref 3.4–10.8)

## 2021-12-13 LAB — TSH
TSH: 2.36 u[IU]/mL (ref 0.450–4.500)
TSH: 2.44 u[IU]/mL (ref 0.450–4.500)

## 2021-12-13 LAB — CMP14+EGFR
ALT: 27 IU/L (ref 0–44)
AST: 22 IU/L (ref 0–40)
Albumin/Globulin Ratio: 1.1 — ABNORMAL LOW (ref 1.2–2.2)
Albumin: 4 g/dL (ref 3.8–4.8)
Alkaline Phosphatase: 76 IU/L (ref 44–121)
BUN/Creatinine Ratio: 18 (ref 10–24)
BUN: 22 mg/dL (ref 8–27)
Bilirubin Total: <0.2 mg/dL (ref 0.0–1.2)
CO2: 23 mmol/L (ref 20–29)
Calcium: 9.8 mg/dL (ref 8.6–10.2)
Chloride: 102 mmol/L (ref 96–106)
Creatinine, Ser: 1.24 mg/dL (ref 0.76–1.27)
Globulin, Total: 3.6 g/dL (ref 1.5–4.5)
Glucose: 195 mg/dL — ABNORMAL HIGH (ref 70–99)
Potassium: 5 mmol/L (ref 3.5–5.2)
Sodium: 139 mmol/L (ref 134–144)
Total Protein: 7.6 g/dL (ref 6.0–8.5)
eGFR: 66 mL/min/{1.73_m2} (ref >59)

## 2021-12-13 LAB — LIPID PANEL
Chol/HDL Ratio: 4.3 ratio (ref 0.0–5.0)
Chol/HDL Ratio: 4.7 ratio (ref 0.0–5.0)
Cholesterol, Total: 219 mg/dL — ABNORMAL HIGH (ref 100–199)
Cholesterol, Total: 226 mg/dL — ABNORMAL HIGH (ref 100–199)
HDL: 48 mg/dL (ref >39)
HDL: 51 mg/dL (ref >39)
LDL Chol Calc (NIH): 147 mg/dL — ABNORMAL HIGH (ref 0–99)
LDL Chol Calc (NIH): 157 mg/dL — ABNORMAL HIGH (ref 0–99)
Triglycerides: 116 mg/dL (ref 0–149)
Triglycerides: 118 mg/dL (ref 0–149)
VLDL Cholesterol Cal: 21 mg/dL (ref 5–40)
VLDL Cholesterol Cal: 21 mg/dL (ref 5–40)

## 2021-12-13 LAB — VITAMIN D 25 HYDROXY (VIT D DEFICIENCY, FRACTURES): Vit D, 25-Hydroxy: 16.6 ng/mL — ABNORMAL LOW (ref 30.0–100.0)

## 2021-12-13 LAB — HEPATITIS C ANTIBODY: Hep C Virus Ab: NONREACTIVE

## 2021-12-13 LAB — PSA: Prostate Specific Ag, Serum: 8.3 ng/mL — ABNORMAL HIGH (ref 0.0–4.0)

## 2021-12-13 LAB — T4, FREE: Free T4: 1.55 ng/dL (ref 0.82–1.77)

## 2021-12-13 MED ORDER — ROSUVASTATIN CALCIUM 10 MG PO TABS: 10.0000 mg | ORAL_TABLET | Freq: Every day | ORAL | 1 refills | Status: DC

## 2021-12-19 LAB — HM DIABETES EYE EXAM

## 2022-01-01 NOTE — Progress Notes (Signed)
H&P ? ?Chief Complaint: Elevated PSA ? ?History of Present Illness: 61 year old male sent by Dr. Posey Pronto for evaluation and management of elevated PSA.  This was performed about 3 weeks ago and returned 8.3.  He is on finasteride, so corrected value is 16.6. ? ?He does have a prior history of BPH.  This was managed at [facility] in the past.  He is currently on finasteride and tamsulosin.  Overall, no significant lower urinary tract symptoms.  IPSS 2. ? ?He also has ED, managed with Cialis. ? ?No recent gross hematuria or dysuria. ? ?Past Medical History:  ?Diagnosis Date  ? Diabetes mellitus without complication ([place])   ? DIAGNOSED AGE 61  ? Diabetic ulcer of left great toe ([place]) 01/25/2021  ? Healed diabetic foot ulcer 02/22/2021  ? Hypertension   ? ? ?Past Surgical History:  ?Procedure Laterality Date  ? ESOPHAGEAL DILATION N/A 12/07/2019  ? Procedure: ESOPHAGEAL OR PYLORIC DILATION;  Surgeon: Danie Binder, MD;  [facility];  Service: Endoscopy;  Laterality: N/A;  ? ESOPHAGOGASTRODUODENOSCOPY N/A 12/07/2019  ? erosive gastritis, many non-bleeding cratered and superficial duodenal ulcers without stigmata of bleeding in duodenal bulb. Empiric dilation due to possible occult esophageal web. Negative H.pylori.   ? EYE SURGERY  03/02/2020  ? diabetic retinopathy, lens placement, cataract removal, right eye  ? HERNIA REPAIR Left 1990  ? ? ?Home Medications:  ?Allergies as of 01/02/2022   ?No Known Allergies ?  ? ?  ?Medication List  ?  ? ?  ? Accurate as of Jan 01, 2022  7:50 PM. If you have any questions, ask your nurse or doctor.  ?  ?  ? ?  ? ?Accu-Chek Guide Me w/Device Kit ?1 Piece by Does not apply route as directed. ?  ?atropine 1 % ophthalmic solution ?SMARTSIG:1 Drop(s) Left Eye Morning-Night ?  ?B-D ULTRAFINE III SHORT PEN 31G X 8 MM Misc ?Generic drug: Insulin Pen Needle ?1 each by Does not apply route as directed. ?  ?blood glucose meter kit and supplies Kit ?1  each by Does not apply route 4 (four) times daily. Dispense based on patient and insurance preference. Use up to four times daily as directed. ?  ?dorzolamide 2 % ophthalmic solution ?Commonly known as: TRUSOPT ?Place 1 drop into the left eye 3 (three) times daily. ?  ?finasteride 5 MG tablet ?Commonly known as: PROSCAR ?Take 1 tablet (5 mg total) by mouth daily. ?  ?FreeStyle [place] 2 Reader Kerrin Mo ?As directed ?  ?FreeStyle Libre 2 Sensor Misc ?Change sensor every 14 days ?  ?FUROSEMIDE PO ?Take by mouth daily. ?  ?glipiZIDE XL 5 MG 24 hr tablet ?Generic drug: glipiZIDE ?TAKE 1 TABLET BY MOUTH  DAILY WITH BREAKFAST ?  ?Lantus SoloStar 100 UNIT/ML Solostar Pen ?Generic drug: insulin glargine ?INJECT 20 UNITS SUBCUTANEOUSLY AT BEDTIME ?  ?losartan 100 MG tablet ?Commonly known as: COZAAR ?Take 1 tablet (100 mg total) by mouth daily. ?  ?LUBRICANT EYE DROPS OP ?Apply 1 Container to eye as needed (dry eye). Left eye ?  ?OneTouch Delica Plus [id number] Misc ?Apply topically. ?  ?OneTouch Verio test strip ?Generic drug: glucose blood ?USE 1 STRIP TO CHECK GLUCOSE UP TO 2 TIMES DAILY ?  ?prednisoLONE acetate 1 % ophthalmic suspension ?Commonly known as: PRED FORTE ?1 drop 4 (four) times daily. ?  ?PROBIOTIC PO ?Take 1 tablet by mouth daily. ?  ?QC DAILY MULTIVIT/MULTIMINERAL PO ?Take 1 tablet by mouth daily. ?  ?  rosuvastatin 10 MG tablet ?Commonly known as: Crestor ?Take 1 tablet (10 mg total) by mouth daily. ?  ?senna-docusate 8.6-50 MG tablet ?Commonly known as: Senokot-S ?Take 1 tablet by mouth as needed. ?  ?tadalafil 20 MG tablet ?Commonly known as: CIALIS ?Take 1 tablet (20 mg total) by mouth daily as needed for erectile dysfunction. ?  ?tamsulosin 0.4 MG Caps capsule ?Commonly known as: FLOMAX ?TAKE 1 CAPSULE BY MOUTH  DAILY ?  ?timolol 0.5 % ophthalmic solution ?Commonly known as: TIMOPTIC ?Place 1 drop into both eyes 3 times daily. ?  ?timolol 0.5 % ophthalmic solution ?Commonly known as: TIMOPTIC ?Place 1 drop into  both eyes 3 times daily. ?  ?UNABLE TO FIND ?Take 5 mLs by mouth every 4 (four) hours as needed. Med Name: Apothecary Cough Syrup ?Take 5 mL by mouth every 4 to 6 hours as needed for cough ?  ? ?  ? ? ?Allergies: No Known Allergies ? ?Family History  ?Problem Relation Age of Onset  ? Stroke Mother   ? Hypertension Mother   ? Diabetes Mother   ? Hypertension Father   ? Diabetes Father   ? Colon cancer Neg Hx   ? Colon polyps Neg Hx   ? ? ?Social History:  reports that he quit smoking about 33 years ago. His smoking use included cigarettes. He has a 5.00 pack-year smoking history. He has never used smokeless tobacco. He reports that he does not currently use alcohol. He reports that he does not use drugs. ? ?ROS: ?A complete review of systems was performed.  All systems are negative except for pertinent findings as noted. ? ?Physical Exam:  ?Vital signs in last 24 hours: ?There were no vitals taken for this visit. ?Constitutional:  Alert and oriented, No acute distress ?Cardiovascular: Regular rate  ?Respiratory: Normal respiratory effort ?GI: Abdomen is soft, nontender, nondistended, no abdominal masses. No CVAT.  No inguinal hernias. ?Genitourinary: Normal male phallus, testes are descended bilaterally and non-tender and without masses, scrotum is normal in appearance without lesions or masses, perineum is normal on inspection.  Not tolerant of rectal exam.  Long anal canal.  I was just able to feel the apex of the prostate.  No nodularity noted. ?Lymphatic: No lymphadenopathy ?Neurologic: Grossly intact, no focal deficits ?Psychiatric: Normal mood and affect ? ?I have reviewed prior pt notes ? ?I have reviewed notes from referring/previous physicians ? ?I have reviewed urinalysis results--pyuria present ? ?I have reviewed prior PSA results ? ? ?Impression/Assessment:  ?1.  Elevated PSA.  Exam of the prostate today incomplete.  PSA was 8.3 but corrected value with finasteride use is 16.6.  He does have pyuria ? ?2.   Pyuria, may be causing spurious elevation of PSA ? ?3.  BPH, on dual medical therapy with adequate symptomatic response ? ?Plan:  ?1.  I will culture the urine.  I will not check PSA today ? ?2.  I will have his PSA rechecked after potential antibiotic management in about 3 weeks ? ?3.  Follow-up at that point depending on PSA response ? ?

## 2022-01-02 ENCOUNTER — Encounter: Payer: Self-pay | Admitting: Urology

## 2022-01-02 ENCOUNTER — Ambulatory Visit (INDEPENDENT_AMBULATORY_CARE_PROVIDER_SITE_OTHER): Payer: 59 | Admitting: Urology

## 2022-01-02 VITALS — BP 167/94 | HR 79

## 2022-01-02 DIAGNOSIS — N400 Enlarged prostate without lower urinary tract symptoms: Secondary | ICD-10-CM

## 2022-01-02 DIAGNOSIS — R8281 Pyuria: Secondary | ICD-10-CM | POA: Diagnosis not present

## 2022-01-02 DIAGNOSIS — R972 Elevated prostate specific antigen [PSA]: Secondary | ICD-10-CM

## 2022-01-03 LAB — MICROSCOPIC EXAMINATION
Epithelial Cells (non renal): NONE SEEN /hpf (ref 0–10)
Renal Epithel, UA: NONE SEEN /hpf

## 2022-01-03 LAB — URINALYSIS, ROUTINE W REFLEX MICROSCOPIC
Bilirubin, UA: NEGATIVE
Glucose, UA: NEGATIVE
Ketones, UA: NEGATIVE
Nitrite, UA: NEGATIVE
Specific Gravity, UA: 1.02 (ref 1.005–1.030)
Urobilinogen, Ur: 0.2 mg/dL (ref 0.2–1.0)
pH, UA: 7 (ref 5.0–7.5)

## 2022-01-04 LAB — URINE CULTURE

## 2022-01-05 ENCOUNTER — Telehealth: Payer: Self-pay

## 2022-01-05 NOTE — Telephone Encounter (Signed)
Patient called with questions about penile pumps.  Message sent to provider

## 2022-01-08 ENCOUNTER — Ambulatory Visit: Payer: 59 | Admitting: Family Medicine

## 2022-01-09 ENCOUNTER — Ambulatory Visit: Payer: 59 | Admitting: Family Medicine

## 2022-01-09 ENCOUNTER — Encounter: Payer: Self-pay | Admitting: Family Medicine

## 2022-01-09 ENCOUNTER — Other Ambulatory Visit: Payer: Self-pay | Admitting: Internal Medicine

## 2022-01-09 ENCOUNTER — Telehealth: Payer: Self-pay | Admitting: Internal Medicine

## 2022-01-09 VITALS — BP 180/110 | HR 80 | Ht 69.0 in | Wt 184.1 lb

## 2022-01-09 DIAGNOSIS — Z794 Long term (current) use of insulin: Secondary | ICD-10-CM

## 2022-01-09 DIAGNOSIS — I10 Essential (primary) hypertension: Secondary | ICD-10-CM | POA: Diagnosis not present

## 2022-01-09 DIAGNOSIS — L918 Other hypertrophic disorders of the skin: Secondary | ICD-10-CM | POA: Diagnosis not present

## 2022-01-09 DIAGNOSIS — R059 Cough, unspecified: Secondary | ICD-10-CM

## 2022-01-09 DIAGNOSIS — S30861A Insect bite (nonvenomous) of abdominal wall, initial encounter: Secondary | ICD-10-CM

## 2022-01-09 DIAGNOSIS — W57XXXA Bitten or stung by nonvenomous insect and other nonvenomous arthropods, initial encounter: Secondary | ICD-10-CM

## 2022-01-09 DIAGNOSIS — E113592 Type 2 diabetes mellitus with proliferative diabetic retinopathy without macular edema, left eye: Secondary | ICD-10-CM

## 2022-01-09 DIAGNOSIS — J209 Acute bronchitis, unspecified: Secondary | ICD-10-CM

## 2022-01-09 MED ORDER — GLIPIZIDE ER 5 MG PO TB24: 5.0000 mg | ORAL_TABLET | Freq: Every day | ORAL | 1 refills | Status: DC

## 2022-01-09 MED ORDER — AMLODIPINE BESYLATE 5 MG PO TABS: 5.0000 mg | ORAL_TABLET | Freq: Every day | ORAL | 0 refills | Status: DC

## 2022-01-09 MED ORDER — DOXYCYCLINE HYCLATE 100 MG PO TABS: 100.0000 mg | ORAL_TABLET | Freq: Two times a day (BID) | ORAL | 0 refills | Status: DC

## 2022-01-09 MED ORDER — BENZONATATE 100 MG PO CAPS: 100.0000 mg | ORAL_CAPSULE | Freq: Two times a day (BID) | ORAL | 0 refills | Status: DC | PRN

## 2022-01-09 NOTE — Patient Instructions (Signed)
F/U with dr Allena Katz in 2 to 6 weeks, re evaluate uncontrolled hypertension, bring medications to visit    For tick bite doxycycline is prescribed for 10 days  For blood pressure, additional medication amlodipine 5 mg is added, contine medication prescribed by dr Charmaine Downs will be referred for skin tag removal  Thanks for choosing [facility], we consider it a privelige to serve you.

## 2022-01-09 NOTE — Assessment & Plan Note (Signed)
Followed by Endo, compolications due to poor control and pt is aware, states sugar isimproving

## 2022-01-09 NOTE — Assessment & Plan Note (Signed)
Uncontrolled, reports compliance and denies alcohol use Add amlodipine and f/u with [facility] DASH diet and commitment to daily physical activity for a minimum of 30 minutes discussed and encouraged, as a part of hypertension management. The importance of attaining a healthy weight is also discussed.     01/09/2022    9:28 AM 01/09/2022    8:53 AM 01/02/2022   10:15 AM 12/12/2021    8:32 AM 12/12/2021    8:26 AM 11/23/2021    8:32 AM 10/05/2021    8:27 AM  BP/Weight  Systolic BP 180 170 167 138 142 152 150  Diastolic BP 110 116 94 98 102 98 96  Wt. (Lbs)  184.12   182.4 183.4 181.8  BMI  27.19 kg/m2   26.94 kg/m2 26.32 kg/m2 26.09 kg/m2

## 2022-01-09 NOTE — Assessment & Plan Note (Signed)
Irritating pt on left inner thigh , derm to remove

## 2022-01-09 NOTE — Progress Notes (Signed)
   Zahir Mominee     MRN: [id number]      DOB: [birth year]   HPI Mr. Southers is here c/o tick bite on 5/20, same removed entirely but area is sore , red and swollen , concerned about lyme disease, no rash or constitutional symptoms C/o irritaing mole/ skin tag on left inner upper thigh wants it removed Denies regular alcohol use or any recent excessive use, though blood pressure very high  ROS Denies recent fever or chills.DEnies myalgias Denies sinus pressure, nasal congestion, ear pain or sore throat. C/o cough Denies chest pains, palpitations and leg swelling Denies abdominal pain, nausea, vomiting,diarrhea or constipation.   Denies dysuria, frequency, hesitancy or incontinence. Denies joint pain, swelling and limitation in mobility. Denies headaches, seizures, numbness, or tingling. Denies depression, anxiety or insomnia. wn or rash.   PE  BP (!) 180/110   Pulse 80   Ht 5\' 9"  (1.753 m)   Wt 184 lb 1.9 oz (83.5 kg)   SpO2 95%   BMI 27.19 kg/m   Patient alert and oriented and in no cardiopulmonary distress.  HEENT: No facial asymmetry, EOMI,     Neck supple .  Chest: Clear to auscultation bilaterally.  CVS: S1, S2 no murmurs, no S3.Regular rate.  ABD: Soft non tender.   Ext: No edema  MS: Adequate ROM spine, shoulders, hips and knees.  Skin: puncture site on LLQ , no retained tick present, positive erythema , swelling and tenderness. Skin tag  on left inner thigh near perineum  Psych: Good eye contact, normal affect. Memory intact not anxious or depressed appearing.  CNS: CN 2-12 intact, power,  normal throughout.no focal deficits noted.   Assessment & Plan Skin tag of perineum in male Irritating pt on left inner thigh , derm to remove  Essential hypertension Uncontrolled, reports compliance and denies alcohol use Add amlodipine and f/u with [facility]cP DASH diet and commitment to daily physical activity for a minimum of 30 minutes discussed and encouraged, as a part  of hypertension management. The importance of attaining a healthy weight is also discussed.     01/09/2022    9:28 AM 01/09/2022    8:53 AM 01/02/2022   10:15 AM 12/12/2021    8:32 AM 12/12/2021    8:26 AM 11/23/2021    8:32 AM 10/05/2021    8:27 AM  BP/Weight  Systolic BP 99991111 [id number] A999333 0000000 [id number]  Diastolic BP [id number] 94 98 102 98 96  Wt. (Lbs)  184.12   182.4 183.4 181.8  BMI  27.19 kg/m2   26.94 kg/m2 26.32 kg/m2 26.09 kg/m2       Tick bite No retained tick present, cellulitis in area, doxycycline prescribed x 10 days, f/u with pCP  Cough After visit called back requesting tessalon perles which he uses sporadically as  Needed, limited supply with no refills sent  Type 2 diabetes mellitus with ophthalmic complication ([place]) Followed by Endo, compolications due to poor control and pt is aware, states sugar isimproving

## 2022-01-09 NOTE — Assessment & Plan Note (Signed)
No retained tick present, cellulitis in area, doxycycline prescribed x 10 days, f/u with pCP

## 2022-01-09 NOTE — Progress Notes (Signed)
   Chad Kirk     MRN: [id number]      DOB: 1961-02-26   HPI Chad Kirk is here with tick bite to left lower abdomen, no constitutional symptoms but h/o lyme disease in the area. C/o irritating left inner thigh skin tag wants it removed  Denies regular alcohool udse and  ROS Denies recent fever or chills. Denies sinus pressure, nasal congestion, ear pain or sore throat. Denies chest congestion, productive cough or wheezing. Denies chest pains, palpitations and leg swelling Denies abdominal pain, nausea, vomiting,diarrhea or constipation.   Denies dysuria, frequency, hesitancy or incontinence. Denies joint pain, swelling and limitation in mobility. Denies headaches, seizures, numbness, or tingling. Denies depression, anxiety or insomnia. Marland Kitchen   PE  BP (!) 180/110   Pulse 80   Ht 5\' 9"  (1.753 m)   Wt 184 lb 1.9 oz (83.5 kg)   SpO2 95%   BMI 27.19 kg/m   Patient alert and oriented and in no cardiopulmonary distress.  HEENT: No facial asymmetry, EOMI,     Neck supple .  Chest: Clear to auscultation bilaterally.  CVS: S1, S2 no murmurs, no S3.Regular rate.  ABD: Soft non tender.   Ext: No edema  MS: Adequate ROM spine, shoulders, hips and knees.  Skin ; see prior note Psych: Good eye contact, normal affect. Memory intact not anxious or depressed appearing.  CNS: CN 2-12 intact, power,  normal throughout.no focal deficits noted.   Assessment & Plan  Skin tag of perineum in male Irritating pt on left inner thigh , derm to remove  Essential hypertension Uncontrolled, reports compliance and denies alcohol use Add amlodipine and f/u with PcP DASH diet and commitment to daily physical activity for a minimum of 30 minutes discussed and encouraged, as a part of hypertension management. The importance of attaining a healthy weight is also discussed.     01/09/2022    9:28 AM 01/09/2022    8:53 AM 01/02/2022   10:15 AM 12/12/2021    8:32 AM 12/12/2021    8:26 AM 11/23/2021     8:32 AM 10/05/2021    8:27 AM  BP/Weight  Systolic BP 180 170 167 138 142 152 150  Diastolic BP 110 116 94 98 102 98 96  Wt. (Lbs)  184.12   182.4 183.4 181.8  BMI  27.19 kg/m2   26.94 kg/m2 26.32 kg/m2 26.09 kg/m2       Tick bite No retained tick present, cellulitis in area, doxycycline prescribed x 10 days, f/u with pCP  Cough After visit called back requesting tessalon perles which he uses sporadically as  Needed, limited supply with no refills sent  Type 2 diabetes mellitus with ophthalmic complication (HCC) Followed by Endo, compolications due to poor control and pt is aware, states sugar isimproving

## 2022-01-09 NOTE — Telephone Encounter (Signed)
Pt called back and forgot to tell you he wanted a refill of the tessalon perles that he takes for his coughing that he has at times

## 2022-01-09 NOTE — Assessment & Plan Note (Signed)
After visit called back requesting tessalon perles which he uses sporadically as  Needed, limited supply with no refills sent

## 2022-01-11 ENCOUNTER — Telehealth: Payer: Self-pay

## 2022-01-11 NOTE — Telephone Encounter (Signed)
-----   Message from Marcine Matar, MD sent at 01/09/2022  8:50 AM EDT ----- Let patient know that urine culture was negative and he does not need antibiotics at the present time. ----- Message ----- From: Ferdinand Lango, RN Sent: 01/04/2022   2:32 PM EDT To: Marcine Matar, MD  Please review

## 2022-01-11 NOTE — Telephone Encounter (Signed)
Called patient. No answer. No way to leave message.  Mailbox full

## 2022-01-11 NOTE — Telephone Encounter (Signed)
Called patient. No answer. Mailbox full.

## 2022-01-12 ENCOUNTER — Telehealth: Payer: Self-pay | Admitting: Podiatry

## 2022-01-12 NOTE — Telephone Encounter (Signed)
I called pt and gave recommendation. He is scheduled with Dr Allena Katz for follow up appt on 6/9.   Thanks!

## 2022-01-12 NOTE — Telephone Encounter (Signed)
Pt called and is currently out of town on vacation at [facility] and will be returning on Wed 5/31. He states he took a shower and hit his foot on the drain insert as it was reversed in the drain. He states his left foot on his heel is now bruised from the injury, it was pinched pretty bad. No open sore, just a bad bruise. He wants to know what you would recommend if you know of a facility nearby to get it checked or could you do a phone consultation.   Please advise.

## 2022-01-26 ENCOUNTER — Ambulatory Visit: Payer: 59 | Admitting: "Endocrinology

## 2022-01-26 ENCOUNTER — Ambulatory Visit: Payer: 59 | Admitting: Podiatry

## 2022-01-30 ENCOUNTER — Ambulatory Visit: Payer: 59 | Admitting: Podiatry

## 2022-01-30 ENCOUNTER — Encounter: Payer: Self-pay | Admitting: "Endocrinology

## 2022-01-30 ENCOUNTER — Ambulatory Visit (INDEPENDENT_AMBULATORY_CARE_PROVIDER_SITE_OTHER): Payer: 59 | Admitting: "Endocrinology

## 2022-01-30 ENCOUNTER — Other Ambulatory Visit: Payer: 59

## 2022-01-30 VITALS — BP 122/82 | HR 84 | Ht 69.0 in | Wt 183.6 lb

## 2022-01-30 DIAGNOSIS — I10 Essential (primary) hypertension: Secondary | ICD-10-CM | POA: Diagnosis not present

## 2022-01-30 DIAGNOSIS — E113592 Type 2 diabetes mellitus with proliferative diabetic retinopathy without macular edema, left eye: Secondary | ICD-10-CM | POA: Diagnosis not present

## 2022-01-30 DIAGNOSIS — Z794 Long term (current) use of insulin: Secondary | ICD-10-CM

## 2022-01-30 DIAGNOSIS — E782 Mixed hyperlipidemia: Secondary | ICD-10-CM | POA: Diagnosis not present

## 2022-01-30 DIAGNOSIS — R972 Elevated prostate specific antigen [PSA]: Secondary | ICD-10-CM

## 2022-01-30 LAB — POCT GLYCOSYLATED HEMOGLOBIN (HGB A1C): HbA1c, POC (controlled diabetic range): 8.7 % — AB (ref 0.0–7.0)

## 2022-01-30 MED ORDER — LANTUS SOLOSTAR 100 UNIT/ML ~~LOC~~ SOPN
PEN_INJECTOR | SUBCUTANEOUS | 2 refills | Status: DC
Start: 2022-01-30 — End: 2022-05-21

## 2022-01-30 NOTE — Progress Notes (Signed)
01/30/2022, 12:49 PM  Endocrinology follow-up note   Subjective:    Patient ID: Chad Kirk, male    DOB: 1961/03/13.  Chad Kirk is being seen in follow-up after he was seen in consultation for management of currently uncontrolled symptomatic diabetes requested by  [name], MD.   Past Medical History:  Diagnosis Date   Diabetes mellitus without complication ([place])    DIAGNOSED AGE 61   Diabetic ulcer of left great toe ([place]) 01/25/2021   Healed diabetic foot ulcer 02/22/2021   Hypertension     Past Surgical History:  Procedure Laterality Date   ESOPHAGEAL DILATION N/A 12/07/2019   Procedure: ESOPHAGEAL OR PYLORIC DILATION;  Surgeon: Danie Binder, MD;  Location: [facility];  Service: Endoscopy;  Laterality: N/A;   ESOPHAGOGASTRODUODENOSCOPY N/A 12/07/2019   erosive gastritis, many non-bleeding cratered and superficial duodenal ulcers without stigmata of bleeding in duodenal bulb. Empiric dilation due to possible occult esophageal web. Negative H.pylori.    EYE SURGERY  03/02/2020   diabetic retinopathy, lens placement, cataract removal, right eye   HERNIA REPAIR Left 1990    Social History   Socioeconomic History   Marital status: Married    Spouse name: Not on file   Number of children: Not on file   Years of education: Not on file   Highest education level: Not on file  Occupational History   Not on file  Tobacco Use   Smoking status: Former    Packs/day: 1.00    Years: 5.00    Total pack years: 5.00    Types: Cigarettes    Quit date: 08/20/1988    Years since quitting: 33.4   Smokeless tobacco: Never  Vaping Use   Vaping Use: Never used  Substance and Sexual Activity   Alcohol use: Not Currently    Comment: denied 03/15/20   Drug use: No   Sexual activity: Not on file  Other Topics Concern   Not on file  Social History Narrative   Not on file   Social  Determinants of Health   Financial Resource Strain: Not on file  Food Insecurity: Not on file  Transportation Needs: Not on file  Physical Activity: Not on file  Stress: Not on file  Social Connections: Not on file    Family History  Problem Relation Age of Onset   Stroke Mother    Hypertension Mother    Diabetes Mother    Hypertension Father    Diabetes Father    Colon cancer Neg Hx    Colon polyps Neg Hx     Outpatient Encounter Medications as of 01/30/2022  Medication Sig   amLODipine (NORVASC) 5 MG tablet Take 1 tablet (5 mg total) by mouth daily.   atropine 1 % ophthalmic solution SMARTSIG:1 Drop(s) Left Eye Morning-Night   benzonatate (TESSALON) 100 MG capsule Take 1 capsule (100 mg total) by mouth 2 (two) times daily as needed for cough.   blood glucose meter kit and supplies KIT 1 each by Does not apply route 4 (four) times daily. Dispense based on patient and insurance preference. Use up  to four times daily as directed.   Blood Glucose Monitoring Suppl (ACCU-CHEK GUIDE ME) w/Device KIT 1 Piece by Does not apply route as directed.   Carboxymethylcellulose Sodium (LUBRICANT EYE DROPS OP) Apply 1 Container to eye as needed (dry eye). Left eye   Continuous Blood Gluc Receiver (FREESTYLE LIBRE 2 READER) DEVI As directed   Continuous Blood Gluc Sensor (FREESTYLE LIBRE 2 SENSOR) MISC Change sensor every 14 days   dorzolamide (TRUSOPT) 2 % ophthalmic solution Place 1 drop into the left eye 3 (three) times daily.   doxycycline (VIBRA-TABS) 100 MG tablet Take 1 tablet (100 mg total) by mouth 2 (two) times daily.   finasteride (PROSCAR) 5 MG tablet Take 1 tablet (5 mg total) by mouth daily.   FUROSEMIDE PO Take by mouth daily.   glipiZIDE (GLIPIZIDE XL) 5 MG 24 hr tablet Take 1 tablet (5 mg total) by mouth daily with breakfast.   glucose blood (ONETOUCH VERIO) test strip USE 1 STRIP TO CHECK GLUCOSE UP TO 2 TIMES DAILY   Insulin Pen Needle (B-D ULTRAFINE III SHORT PEN) 31G X 8 MM  MISC 1 each by Does not apply route as directed.   Lancets (ONETOUCH DELICA PLUS [id number]) MISC Apply topically.   LANTUS SOLOSTAR 100 UNIT/ML Solostar Pen INJECT 24 UNITS SUBCUTANEOUSLY AT BEDTIME   losartan (COZAAR) 100 MG tablet Take 1 tablet (100 mg total) by mouth daily.   Multiple Vitamins-Minerals (QC DAILY MULTIVIT/MULTIMINERAL PO) Take 1 tablet by mouth daily.   prednisoLONE acetate (PRED FORTE) 1 % ophthalmic suspension 1 drop 4 (four) times daily.   Probiotic Product (PROBIOTIC PO) Take 1 tablet by mouth daily.   rosuvastatin (CRESTOR) 10 MG tablet Take 1 tablet (10 mg total) by mouth daily.   senna-docusate (SENOKOT-S) 8.6-50 MG tablet Take 1 tablet by mouth as needed.    tadalafil (CIALIS) 20 MG tablet Take 1 tablet (20 mg total) by mouth daily as needed for erectile dysfunction.   tamsulosin (FLOMAX) 0.4 MG CAPS capsule TAKE 1 CAPSULE BY MOUTH  DAILY   timolol (TIMOPTIC) 0.5 % ophthalmic solution Place 1 drop into both eyes 3 times daily.   UNABLE TO FIND Take 5 mLs by mouth every 4 (four) hours as needed. Med Name: Apothecary Cough Syrup Take 5 mL by mouth every 4 to 6 hours as needed for cough   [DISCONTINUED] LANTUS SOLOSTAR 100 UNIT/ML Solostar Pen INJECT 20 UNITS SUBCUTANEOUSLY AT BEDTIME   No facility-administered encounter medications on file as of 01/30/2022.    ALLERGIES: No Known Allergies  VACCINATION STATUS: Immunization History  Administered Date(s) Administered   Fluad Quad(high Dose 65+) 06/27/2021   PFIZER(Purple Top)SARS-COV-2 Vaccination 10/17/2019, 11/07/2019   Pneumococcal Conjugate-13 08/08/2021   Pneumococcal Polysaccharide-23 06/30/2020   Tdap 02/03/2012   Zoster Recombinat (Shingrix) 10/05/2021, 11/23/2021    Diabetes He presents for his follow-up diabetic visit. He has type 2 diabetes mellitus. Onset time: He was diagnosed at approximate age of 61 years. His disease course has been improving. Pertinent negatives for hypoglycemia include no  confusion, headaches, nervousness/anxiousness, pallor, seizures, sweats or tremors. Pertinent negatives for diabetes include no blurred vision, no chest pain, no fatigue, no polydipsia, no polyphagia, no polyuria and no weakness. There are no hypoglycemic complications. Symptoms are improving. Diabetic complications include impotence, nephropathy, peripheral neuropathy, PVD and retinopathy. (He is reporting legal blindness from diabetic retinopathy on both eyes.) Risk factors for coronary artery disease include dyslipidemia, diabetes mellitus, family history, male sex, hypertension and sedentary lifestyle. Current diabetic treatments:  He is currently on Lantus 15-20 units 1-2 times a day. His weight is fluctuating minimally. He is following a generally unhealthy diet. When asked about meal planning, he reported none. His home blood glucose trend is decreasing steadily. His breakfast blood glucose range is generally 180-200 mg/dl. His lunch blood glucose range is generally 140-180 mg/dl. His dinner blood glucose range is generally 180-200 mg/dl. His bedtime blood glucose range is generally 180-200 mg/dl. His overall blood glucose range is 180-200 mg/dl. (Chad Kirk presents with his CGM device showing continued improvement in his glycemic profile.  Most recently his average blood glucose 188 over the last 14 days.  His point-of-care A1c is 8.7%, generally improving.  No hypoglycemia.  49% time range, 50% above range.  ) He does not see a podiatrist.Eye exam is current.  Hyperlipidemia This is a chronic problem. The current episode started more than 1 year ago. The problem is uncontrolled. Recent lipid tests were reviewed and are high. Exacerbating diseases include chronic renal disease and diabetes. Pertinent negatives include no chest pain, myalgias or shortness of breath. Current antihyperlipidemic treatment includes statins. Risk factors for coronary artery disease include diabetes mellitus, dyslipidemia,  hypertension, male sex, a sedentary lifestyle and family history.  Hypertension This is a chronic problem. The current episode started more than 1 year ago. The problem is controlled. Pertinent negatives include no blurred vision, chest pain, headaches, neck pain, palpitations, shortness of breath or sweats. Risk factors for coronary artery disease include dyslipidemia, family history, diabetes mellitus, male gender and sedentary lifestyle. Past treatments include calcium channel blockers. Hypertensive end-organ damage includes kidney disease, PVD and retinopathy. Identifiable causes of hypertension include chronic renal disease.     Review of Systems  Constitutional:  Negative for chills, fatigue, fever and unexpected weight change.  HENT:  Negative for dental problem, mouth sores and trouble swallowing.   Eyes:  Negative for blurred vision and visual disturbance.  Respiratory:  Negative for cough, choking, chest tightness, shortness of breath and wheezing.   Cardiovascular:  Negative for chest pain, palpitations and leg swelling.  Gastrointestinal:  Negative for abdominal distention, abdominal pain, constipation, diarrhea, nausea and vomiting.  Endocrine: Negative for polydipsia, polyphagia and polyuria.  Genitourinary:  Positive for impotence. Negative for dysuria, flank pain, hematuria and urgency.  Musculoskeletal:  Negative for back pain, gait problem, myalgias and neck pain.  Skin:  Negative for pallor, rash and wound.  Neurological:  Negative for tremors, seizures, syncope, weakness, numbness and headaches.  Psychiatric/Behavioral:  Negative for confusion and dysphoric mood. The patient is not nervous/anxious.     Objective:       01/30/2022   10:53 AM 01/09/2022    9:28 AM 01/09/2022    8:53 AM  Vitals with BMI  Height $Remov'5\' 9"'nNSIar$   '5\' 9"'$   Weight 183 lbs 10 oz  184 lbs 2 oz  BMI 80.0  34.91  Systolic 791 505 697  Diastolic 82 948 016  Pulse 84  80    BP 122/82   Pulse 84   Ht 5'  9" (1.753 m)   Wt 183 lb 9.6 oz (83.3 kg)   BMI 27.11 kg/m   Wt Readings from Last 3 Encounters:  01/30/22 183 lb 9.6 oz (83.3 kg)  01/09/22 184 lb 1.9 oz (83.5 kg)  12/12/21 182 lb 6.4 oz (82.7 kg)       CMP ( most recent) CMP     Component Value Date/Time   NA 139 12/12/2021 0909   K  5.0 12/12/2021 0909   CL 102 12/12/2021 0909   CO2 23 12/12/2021 0909   GLUCOSE 195 (H) 12/12/2021 0909   GLUCOSE 256 (H) 12/30/2019 1218   BUN 22 12/12/2021 0909   CREATININE 1.24 12/12/2021 0909   CALCIUM 9.8 12/12/2021 0909   PROT 7.6 12/12/2021 0909   ALBUMIN 4.0 12/12/2021 0909   AST 22 12/12/2021 0909   ALT 27 12/12/2021 0909   ALKPHOS 76 12/12/2021 0909   BILITOT <0.2 12/12/2021 0909   GFRNONAA >60 12/30/2019 1218   GFRAA >60 12/30/2019 1218     Diabetic Labs (most recent): Lab Results  Component Value Date   HGBA1C 8.7 (A) 01/30/2022   HGBA1C 8.9 (A) 10/05/2021   HGBA1C 8.0 (A) 05/11/2021     Lipid Panel ( most recent) Lipid Panel     Component Value Date/Time   CHOL 226 (H) 12/12/2021 0909   TRIG 118 12/12/2021 0909   HDL 48 12/12/2021 0909   CHOLHDL 4.7 12/12/2021 0909   CHOLHDL 4.4 12/30/2019 1218   VLDL 15 12/30/2019 1218   LDLCALC 157 (H) 12/12/2021 0909   LABVLDL 21 12/12/2021 0909      Assessment & Plan:   1. Type 2 diabetes, uncontrolled, with retinopathy with macular edema (HCC)  - Chad Kirk has currently uncontrolled symptomatic type 2 DM since  61 years of age.  Chad Kirk presents with his CGM device showing continued improvement in his glycemic profile.  Most recently his average blood glucose 188 over the last 14 days.  His point-of-care A1c is 8.7%, generally improving.  No hypoglycemia.  49% time range, 50% above range.    - I had a long discussion with him about the progressive nature of diabetes and the pathology behind its complications. -his diabetes is complicated by retinopathy, peripheral arterial disease, peripheral neuropathy and he  remains at a high risk for more acute and chronic complications which include CAD, CVA, CKD, retinopathy, and neuropathy. These are all discussed in detail with him.  - I have counseled him on diet  and weight management  by adopting a carbohydrate restricted/protein rich diet. Patient is encouraged to switch to  unprocessed or minimally processed     complex starch and increased protein intake (animal or plant source), fruits, and vegetables. -  he is advised to stick to a routine mealtimes to eat 3 meals  a day and avoid unnecessary snacks ( to snack only to correct hypoglycemia).   The following Lifestyle Medicine recommendations according to [place]  [facility]) were discussed and and offered to patient and he  agrees to start the journey:   - he acknowledges that there is a room for improvement in his food and drink choices. - Suggestion is made for him to avoid simple carbohydrates  from his diet including Cakes, Sweet Desserts, Ice Cream, Soda (diet and regular), Sweet Tea, Candies, Chips, Cookies, Store Bought Juices, Alcohol , Artificial Sweeteners,  Coffee Creamer, and "Sugar-free" Products, Lemonade. This will help patient to have more stable blood glucose profile and potentially avoid unintended weight gain.  The following Lifestyle Medicine recommendations according to [place]  [facility]) were discussed and and offered to patient and he  agrees to start the journey:  A. Whole Foods, Plant-Based Nutrition comprising of fruits and vegetables, plant-based proteins, whole-grain carbohydrates was discussed in detail with the patient.   A list for source of those nutrients were also provided to the patient.  Patient  will use only water or unsweetened tea for hydration. B.  The need to stay away from risky substances including alcohol, smoking; obtaining 7 to 9 hours of restorative sleep, at least 150 minutes of moderate intensity exercise  weekly, the importance of healthy social connections,  and stress management techniques were discussed. C.  A full color page of  Calorie density of various food groups per pound showing examples of each food groups was provided to the patient.  - he will be scheduled with Jearld Fenton, RDN, CDE for diabetes education.  - I have approached him with the following individualized plan to manage  his diabetes and patient agrees:   -In light of his presentation with significantly better  glycemic profile, he will not need prandial  insulin for now. He is advised to increase Lantus to 24 units nightly.  He is advised to monitor glucose continuously. - he is encouraged to call clinic for blood glucose levels less than 70 or above 200 mg /dl.  - he is warned not to take insulin without proper monitoring per orders.  -He has responded and benefited from low-dose glipizide.  He is advised to continue glipizide 5 mg XL p.o. daily at breakfast.      - Specific targets for  A1c;  LDL, HDL,  and Triglycerides were discussed with the patient.  2) Blood Pressure /Hypertension: -His blood pressure is controlled to target. he is advised to continue his current medications including amlodipine 10 mg p.o.. daily with breakfast .  He will be considered for HCTZ/lisinopril next visit.   3) Lipids/Hyperlipidemia:   Review of his recent lipid panel showed un controlled LDL at 149.  He is advised to continue Crestor 10 mg p.o. daily at bedtime.   Side effects and precautions discussed with him.     4)  Weight/Diet:  Body mass index is 27.11 kg/m.  -   he is not a candidate for weight loss. Exercise, and detailed carbohydrates information provided  -  detailed on discharge instructions.  5) Chronic Care/Health Maintenance:  -he  is on  Statin medications and  is encouraged to initiate and continue to follow up with Ophthalmology, Dentist,  Podiatrist at least yearly or according to recommendations, and  advised to   stay away from smoking. I have recommended yearly flu vaccine and pneumonia vaccine at least every 5 years; moderate intensity exercise for up to 150 minutes weekly; and  sleep for at least 7 hours a day.  His screening ABI was normal in September 2021.  He has left lower extremity diabetic foot ulcer, under care with podiatry.    His next study is due in September 2027, or sooner if needed.  - he is  advised to maintain close follow up with his pcp for primary care needs, as well as his other providers for optimal and coordinated care   I spent 35 minutes in the care of the patient today including review of labs from [place], Lipids, Thyroid Function, Hematology (current and previous including abstractions from other facilities); face-to-face time discussing  his blood glucose readings/logs, discussing hypoglycemia and hyperglycemia episodes and symptoms, medications doses, his options of short and long term treatment based on the latest standards of care / guidelines;  discussion about incorporating lifestyle medicine;  and documenting the encounter.    Please refer to Patient Instructions for Blood Glucose Monitoring and Insulin/Medications Dosing Guide"  in media tab for additional information. Please  also refer to " Patient Self  Inventory" in the Media  tab for reviewed elements of pertinent patient history.  Chad Kirk participated in the discussions, expressed understanding, and voiced agreement with the above plans.  All questions were answered to his satisfaction. he is encouraged to contact clinic should he have any questions or concerns prior to his return visit.    Follow up plan: - Return in about 3 months (around 05/02/2022) for F/U with Pre-visit Labs, Meter/CGM/Logs, A1c here.  Glade Lloyd, MD [facility] [address] [place], [place] [address] Phone: [phone]  Fax: [phone]    01/30/2022, 12:49  PM  This note was partially dictated with voice recognition software. Similar sounding words can be transcribed inadequately or may not  be corrected upon review.

## 2022-01-30 NOTE — Patient Instructions (Signed)

## 2022-01-31 ENCOUNTER — Ambulatory Visit (INDEPENDENT_AMBULATORY_CARE_PROVIDER_SITE_OTHER): Payer: 59 | Admitting: Podiatry

## 2022-01-31 DIAGNOSIS — Z794 Long term (current) use of insulin: Secondary | ICD-10-CM | POA: Diagnosis not present

## 2022-01-31 DIAGNOSIS — M79674 Pain in right toe(s): Secondary | ICD-10-CM

## 2022-01-31 DIAGNOSIS — M79675 Pain in left toe(s): Secondary | ICD-10-CM | POA: Diagnosis not present

## 2022-01-31 DIAGNOSIS — B351 Tinea unguium: Secondary | ICD-10-CM

## 2022-01-31 DIAGNOSIS — S99922A Unspecified injury of left foot, initial encounter: Secondary | ICD-10-CM

## 2022-01-31 DIAGNOSIS — E1142 Type 2 diabetes mellitus with diabetic polyneuropathy: Secondary | ICD-10-CM

## 2022-01-31 DIAGNOSIS — L84 Corns and callosities: Secondary | ICD-10-CM | POA: Diagnosis not present

## 2022-01-31 LAB — PSA: Prostate Specific Ag, Serum: 10.1 ng/mL — ABNORMAL HIGH (ref 0.0–4.0)

## 2022-02-01 ENCOUNTER — Other Ambulatory Visit: Payer: Self-pay | Admitting: Urology

## 2022-02-01 ENCOUNTER — Telehealth: Payer: Self-pay

## 2022-02-01 DIAGNOSIS — R972 Elevated prostate specific antigen [PSA]: Secondary | ICD-10-CM

## 2022-02-01 MED ORDER — LEVOFLOXACIN 750 MG PO TABS: 750.0000 mg | ORAL_TABLET | Freq: Every day | ORAL | 0 refills | Status: AC

## 2022-02-01 NOTE — Telephone Encounter (Signed)
Patient called and made aware. Prostate biopsy instructions went over with patient via phone and sent in the mail.

## 2022-02-01 NOTE — Telephone Encounter (Signed)
-----   Message from Marcine Matar, MD sent at 02/01/2022 10:08 AM EDT ----- Let pt know that PSA is up a bit more to 10--I recommend TRUS/Bx--orders put in. Please sched ----- Message ----- From: Gustavus Messing, LPN Sent: 4/68/0321   8:06 AM EDT To: Marcine Matar, MD  Please review

## 2022-02-03 ENCOUNTER — Other Ambulatory Visit: Payer: Self-pay | Admitting: Family Medicine

## 2022-02-05 NOTE — Progress Notes (Signed)
Subjective:  Patient ID: Chad Kirk, male    DOB: Oct 19, 1960,  MRN: [id number]  Chad Kirk presents to clinic today for at risk foot care with history of diabetic neuropathy and callus(es) both feet and painful thick toenails that are difficult to trim. Painful toenails interfere with ambulation. Aggravating factors include wearing enclosed shoe gear. Pain is relieved with periodic professional debridement. Painful calluses are aggravated when weightbearing with and without shoegear. Pain is relieved with periodic professional debridement.  Patient states blood glucose was 223 mg/dl today.  Last known HgA1c was 7.5%.  New problem(s): with chief concern of injury to plantar aspect left heel. Injury occurred 01/12/2022. Injury recurred as a result of a laceration sustained while patient was vacationing in [place]. He was taking a shower not knowing the RV shower drain was not secured. He then noticed blood coming from the bottom of his foot as the shower drain lodged and caused a laceration to his heel. He did phone the office and I advised him to report to nearest ED or Urgent Care. Patient states he saw some EMTs responding to another call at the RV park and they treated his heel.  He states it is nearly healed now.   PCP is Anabel Halon, MD , and last visit was December 13, 2021.  No Known Allergies  Review of Systems: Negative except as noted in the HPI.  Objective:  Chad Kirk is a pleasant 61 y.o. male, WD, WN in NAD. AAO X 3.  Vascular Examination: CFT <3 seconds b/l LE. Faintly palpable DP pulses b/l. Nonpalpable PT pulses b/l. Pedal hair absent b/l. Skin temperature gradient WNL b/l. No pain with calf compression b/l. No edema b/l LE. No cyanosis or clubbing noted b/l LE.  Dermatological Examination: Pedal integument with normal turgor, texture and tone BLE. No open wounds b/l LE. No interdigital macerations noted b/l LE. Toenails 1-5  left, 1, 3-5 right elongated, discolored, dystrophic,  thickened, crumbly with subungual debris and tenderness to dorsal palpation. Preulcerative callus plantar left hallux with subdermal hemorrhage. No erythema, no edema, no drainage, no fluctuance.  Healed laceration plantar aspect of left heel from previous area of trauma. No erythema, no edema, no pain, no fluctuance, no drainage.  Musculoskeletal Examination: Normal muscle strength 5/5 to all lower extremity muscle groups bilaterally. HAV with bunion deformity noted b/l LE. Hammertoe deformity noted 2-5 b/l.Marland Kitchen No pain, crepitus or joint limitation noted with ROM b/l LE.  Patient ambulates independently without assistive aids.  Neurological Examination: Protective sensation diminished with 10g monofilament b/l.     Latest Ref Rng & Units 01/30/2022   11:05 AM 10/05/2021    1:22 PM 05/11/2021    8:57 AM 02/08/2021    9:06 AM  Hemoglobin A1C  Hemoglobin-A1c 0.0 - 7.0 % 8.7  8.9  8.0  8.9    Assessment/Plan: 1. Pain due to onychomycosis of toenails of both feet   2. Callus   3. Foot injury, left, initial encounter   4. Type 2 diabetes mellitus with diabetic polyneuropathy, with long-term current use of insulin (HCC)     -Patient was evaluated and treated. All patient's and/or POA's questions/concerns answered on today's visit. -Patient with h/o trauma to plantar aspect of left foot which has healed. -Continue foot and shoe inspections daily. Monitor blood glucose per PCP/Endocrinologist's recommendations. -Mycotic toenails 1-5 bilaterally were debrided in length and girth with sterile nail nippers and dremel without incident. -Callus(es) L hallux pared utilizing sterile scalpel blade without complication or  incident. Total number debrided =1. -Patient/POA to call should there be question/concern in the interim.   Return in about 3 months (around 05/03/2022).  Chad Kirk, DPM

## 2022-02-07 ENCOUNTER — Ambulatory Visit: Payer: 59 | Admitting: Podiatry

## 2022-02-23 ENCOUNTER — Ambulatory Visit (INDEPENDENT_AMBULATORY_CARE_PROVIDER_SITE_OTHER): Payer: 59 | Admitting: Internal Medicine

## 2022-02-23 ENCOUNTER — Encounter: Payer: Self-pay | Admitting: Internal Medicine

## 2022-02-23 VITALS — BP 142/94 | HR 79 | Resp 18 | Ht 69.0 in | Wt 186.6 lb

## 2022-02-23 DIAGNOSIS — E782 Mixed hyperlipidemia: Secondary | ICD-10-CM

## 2022-02-23 DIAGNOSIS — E113592 Type 2 diabetes mellitus with proliferative diabetic retinopathy without macular edema, left eye: Secondary | ICD-10-CM | POA: Diagnosis not present

## 2022-02-23 DIAGNOSIS — I10 Essential (primary) hypertension: Secondary | ICD-10-CM | POA: Diagnosis not present

## 2022-02-23 DIAGNOSIS — Z794 Long term (current) use of insulin: Secondary | ICD-10-CM

## 2022-02-23 MED ORDER — AMLODIPINE BESYLATE 10 MG PO TABS: 10.0000 mg | ORAL_TABLET | Freq: Every day | ORAL | 3 refills | Status: DC

## 2022-02-23 NOTE — Progress Notes (Signed)
Established Patient Office Visit  Subjective:  Patient ID: Chad Kirk, male    DOB: 10-02-1960  Age: 61 y.o. MRN: [id number]  CC:  Chief Complaint  Patient presents with   Follow-up    Follow up re eval HTN uncontrolled     HPI Chad Kirk is a 61 y.o. male with past medical history of diabetes mellitus type 2, PVD, hypertension, hyperlipidemia, diabetic macular edema, BPH and alcohol abuse who presents for f/u of his chronic medical conditions.  HTN: His BP was  elevated today.  His BP has been elevated during recent office visit. Takes medications regularly - Losartan 100 mg QD.  He was recently placed on amlodipine 5 mg daily, but has run out of it for the last 1 week.  Of note, he used to take amlodipine 10 mg QD in the past.  Patient denies headache, dizziness, chest pain, dyspnea or palpitations.  Type II DM: He follows up with Dr. Dorris Fetch for diabetes management.  He takes Lantus 24 units nightly now.  He also takes glipizide 5 mg QD. He follows up with ophthalmologist for diabetic macular edema and his vision has improved now. Denies any polyuria, polyphagia or fatigue.   Past Medical History:  Diagnosis Date   Diabetes mellitus without complication ([place])    DIAGNOSED AGE 29   Diabetic ulcer of left great toe ([place]) 01/25/2021   Healed diabetic foot ulcer 02/22/2021   Hypertension     Past Surgical History:  Procedure Laterality Date   ESOPHAGEAL DILATION N/A 12/07/2019   Procedure: ESOPHAGEAL OR PYLORIC DILATION;  Surgeon: Danie Binder, MD;  Location: [facility];  Service: Endoscopy;  Laterality: N/A;   ESOPHAGOGASTRODUODENOSCOPY N/A 12/07/2019   erosive gastritis, many non-bleeding cratered and superficial duodenal ulcers without stigmata of bleeding in duodenal bulb. Empiric dilation due to possible occult esophageal web. Negative H.pylori.    EYE SURGERY  03/02/2020   diabetic retinopathy, lens placement, cataract removal, right eye   HERNIA REPAIR Left 1990     Family History  Problem Relation Age of Onset   Stroke Mother    Hypertension Mother    Diabetes Mother    Hypertension Father    Diabetes Father    Colon cancer Neg Hx    Colon polyps Neg Hx     Social History   Socioeconomic History   Marital status: Married    Spouse name: Not on file   Number of children: Not on file   Years of education: Not on file   Highest education level: Not on file  Occupational History   Not on file  Tobacco Use   Smoking status: Former    Packs/day: 1.00    Years: 5.00    Total pack years: 5.00    Types: Cigarettes    Quit date: 08/20/1988    Years since quitting: 33.5   Smokeless tobacco: Never  Vaping Use   Vaping Use: Never used  Substance and Sexual Activity   Alcohol use: Not Currently    Comment: denied 03/15/20   Drug use: No   Sexual activity: Not on file  Other Topics Concern   Not on file  Social History Narrative   Not on file   Social Determinants of Health   Financial Resource Strain: Not on file  Food Insecurity: Not on file  Transportation Needs: Not on file  Physical Activity: Not on file  Stress: Not on file  Social Connections: Not on file  Intimate Partner Violence:  Not on file    Outpatient Medications Prior to Visit  Medication Sig Dispense Refill   atropine 1 % ophthalmic solution SMARTSIG:1 Drop(s) Left Eye Morning-Night     benzonatate (TESSALON) 100 MG capsule Take 1 capsule (100 mg total) by mouth 2 (two) times daily as needed for cough. 20 capsule 0   blood glucose meter kit and supplies KIT 1 each by Does not apply route 4 (four) times daily. Dispense based on patient and insurance preference. Use up to four times daily as directed. 1 each 0   Blood Glucose Monitoring Suppl (ACCU-CHEK GUIDE ME) w/Device KIT 1 Piece by Does not apply route as directed. 1 kit 0   Carboxymethylcellulose Sodium (LUBRICANT EYE DROPS OP) Apply 1 Container to eye as needed (dry eye). Left eye     Continuous Blood Gluc  Receiver (FREESTYLE LIBRE 2 READER) DEVI As directed 1 each 0   Continuous Blood Gluc Sensor (FREESTYLE LIBRE 2 SENSOR) MISC Change sensor every 14 days 2 each 2   dorzolamide (TRUSOPT) 2 % ophthalmic solution Place 1 drop into the left eye 3 (three) times daily.     finasteride (PROSCAR) 5 MG tablet Take 1 tablet (5 mg total) by mouth daily. 90 tablet 3   FUROSEMIDE PO Take by mouth daily.     glipiZIDE (GLIPIZIDE XL) 5 MG 24 hr tablet Take 1 tablet (5 mg total) by mouth daily with breakfast. 90 tablet 1   glucose blood (ONETOUCH VERIO) test strip USE 1 STRIP TO CHECK GLUCOSE UP TO 2 TIMES DAILY 100 each 2   Insulin Pen Needle (B-D ULTRAFINE III SHORT PEN) 31G X 8 MM MISC 1 each by Does not apply route as directed. 100 each 3   Lancets (ONETOUCH DELICA PLUS [id number]) MISC Apply topically.     LANTUS SOLOSTAR 100 UNIT/ML Solostar Pen INJECT 24 UNITS SUBCUTANEOUSLY AT BEDTIME 15 mL 2   losartan (COZAAR) 100 MG tablet Take 1 tablet (100 mg total) by mouth daily. 90 tablet 1   Multiple Vitamins-Minerals (QC DAILY MULTIVIT/MULTIMINERAL PO) Take 1 tablet by mouth daily.     prednisoLONE acetate (PRED FORTE) 1 % ophthalmic suspension 1 drop 4 (four) times daily.     Probiotic Product (PROBIOTIC PO) Take 1 tablet by mouth daily.     rosuvastatin (CRESTOR) 10 MG tablet Take 1 tablet (10 mg total) by mouth daily. 90 tablet 1   senna-docusate (SENOKOT-S) 8.6-50 MG tablet Take 1 tablet by mouth as needed.      tadalafil (CIALIS) 20 MG tablet Take 1 tablet (20 mg total) by mouth daily as needed for erectile dysfunction. 30 tablet 5   tamsulosin (FLOMAX) 0.4 MG CAPS capsule TAKE 1 CAPSULE BY MOUTH  DAILY 90 capsule 3   timolol (TIMOPTIC) 0.5 % ophthalmic solution Place 1 drop into both eyes 3 times daily.     UNABLE TO FIND Take 5 mLs by mouth every 4 (four) hours as needed. Med Name: Apothecary Cough Syrup Take 5 mL by mouth every 4 to 6 hours as needed for cough 120 mL 0   amLODipine (NORVASC) 5 MG tablet  Take 1 tablet by mouth once daily (Patient not taking: Reported on 02/23/2022) 30 tablet 0   doxycycline (VIBRA-TABS) 100 MG tablet Take 1 tablet (100 mg total) by mouth 2 (two) times daily. (Patient not taking: Reported on 02/23/2022) 20 tablet 0   No facility-administered medications prior to visit.    No Known Allergies  ROS Review of Systems  Constitutional:  Negative for chills and fever.  HENT:  Negative for congestion and sore throat.   Eyes:  Positive for visual disturbance. Negative for pain and discharge.  Respiratory:  Negative for cough and shortness of breath.   Cardiovascular:  Negative for chest pain and palpitations.  Gastrointestinal:  Negative for constipation, diarrhea, nausea and vomiting.  Endocrine: Negative for polydipsia and polyuria.  Genitourinary:  Negative for dysuria and hematuria.  Musculoskeletal:  Negative for neck pain and neck stiffness.  Skin:  Negative for rash.  Neurological:  Negative for dizziness, weakness, numbness and headaches.  Psychiatric/Behavioral:  Negative for agitation and behavioral problems.       Objective:    Physical Exam Vitals reviewed.  Constitutional:      General: He is not in acute distress.    Appearance: He is not diaphoretic.  HENT:     Head: Normocephalic and atraumatic.     Nose: Nose normal.     Mouth/Throat:     Mouth: Mucous membranes are moist.  Eyes:     General: No scleral icterus.    Extraocular Movements: Extraocular movements intact.  Cardiovascular:     Rate and Rhythm: Normal rate and regular rhythm.     Heart sounds: Normal heart sounds. No murmur heard. Pulmonary:     Breath sounds: Normal breath sounds. No wheezing or rales.  Musculoskeletal:     Cervical back: Neck supple. No tenderness.     Right lower leg: No edema.     Left lower leg: No edema.  Skin:    General: Skin is warm.     Findings: No rash.  Neurological:     General: No focal deficit present.     Mental Status: He is alert  and oriented to person, place, and time.     Sensory: No sensory deficit.     Motor: No weakness.  Psychiatric:        Mood and Affect: Mood normal.        Behavior: Behavior normal.     BP (!) 142/94 (BP Location: Right Arm)   Pulse 79   Resp 18   Ht '5\' 9"'  (1.753 m)   Wt 186 lb 9.6 oz (84.6 kg)   SpO2 99%   BMI 27.56 kg/m  Wt Readings from Last 3 Encounters:  02/23/22 186 lb 9.6 oz (84.6 kg)  01/30/22 183 lb 9.6 oz (83.3 kg)  01/09/22 184 lb 1.9 oz (83.5 kg)    Lab Results  Component Value Date   TSH 2.440 12/12/2021   Lab Results  Component Value Date   WBC 6.1 12/12/2021   HGB 14.0 12/12/2021   HCT 42.9 12/12/2021   MCV 81 12/12/2021   PLT 214 12/12/2021   Lab Results  Component Value Date   NA 139 12/12/2021   K 5.0 12/12/2021   CO2 23 12/12/2021   GLUCOSE 195 (H) 12/12/2021   BUN 22 12/12/2021   CREATININE 1.24 12/12/2021   BILITOT <0.2 12/12/2021   ALKPHOS 76 12/12/2021   AST 22 12/12/2021   ALT 27 12/12/2021   PROT 7.6 12/12/2021   ALBUMIN 4.0 12/12/2021   CALCIUM 9.8 12/12/2021   ANIONGAP 8 12/30/2019   EGFR 66 12/12/2021   Lab Results  Component Value Date   CHOL 226 (H) 12/12/2021   Lab Results  Component Value Date   HDL 48 12/12/2021   Lab Results  Component Value Date   LDLCALC 157 (H) 12/12/2021   Lab Results  Component  Value Date   TRIG 118 12/12/2021   Lab Results  Component Value Date   CHOLHDL 4.7 12/12/2021   Lab Results  Component Value Date   HGBA1C 8.7 (A) 01/30/2022      Assessment & Plan:   Problem List Items Addressed This Visit       Cardiovascular and Mediastinum   Essential hypertension - Primary    BP Readings from Last 1 Encounters:  02/23/22 (!) 142/94  Uncontrolled with Losartan 100 mg daily QD Started amlodipine 10 mg QD as 5 mg dose would not be adequate for him, BP goal < 130/70 Counseled for compliance with the medications Advised DASH diet and moderate exercise/walking, at least 150  mins/week      Relevant Medications   amLODipine (NORVASC) 10 MG tablet     Endocrine   Type 2 diabetes mellitus with ophthalmic complication (HCC)    Lab Results  Component Value Date   HGBA1C 8.7 (A) 01/30/2022  On Lantus 24 U qHS and Glipizide 5 mg QD F/u with Dr Dorris Fetch Advised to follow diabetic diet On statin F/u CMP and lipid panel in the next visit Diabetic eye exam: Advised to continue to follow up with Ophthalmology for diabetic eye exam        Other   Mixed hyperlipidemia    On Crestor Reviewed lipid profile - needs to be compliant with Crestor      Relevant Medications   amLODipine (NORVASC) 10 MG tablet    Meds ordered this encounter  Medications   amLODipine (NORVASC) 10 MG tablet    Sig: Take 1 tablet (10 mg total) by mouth daily.    Dispense:  90 tablet    Refill:  3    Follow-up: Return in about 3 months (around 05/26/2022) for HTN and DM.    [name], MD

## 2022-02-23 NOTE — Assessment & Plan Note (Signed)
BP Readings from Last 1 Encounters:  02/23/22 (!) 142/94   Uncontrolled with Losartan 100 mg daily QD Started amlodipine 10 mg QD as 5 mg dose would not be adequate for him, BP goal < 130/70 Counseled for compliance with the medications Advised DASH diet and moderate exercise/walking, at least 150 mins/week

## 2022-02-23 NOTE — Assessment & Plan Note (Addendum)
On Crestor Reviewed lipid profile - needs to be compliant with Crestor 

## 2022-02-23 NOTE — Patient Instructions (Signed)
Please start taking Amlodipine as prescribed.  Please continue taking other medications as prescribed.  Please continue to follow DASH diet and perform moderate exercise/walking at least 150 mins/week.

## 2022-02-23 NOTE — Assessment & Plan Note (Signed)
Lab Results  Component Value Date   HGBA1C 8.7 (A) 01/30/2022   On Lantus 24 U qHS and Glipizide 5 mg QD F/u with Dr [name] Advised to follow diabetic diet On statin F/u CMP and lipid panel in the next visit Diabetic eye exam: Advised to continue to follow up with Ophthalmology for diabetic eye exam

## 2022-02-26 ENCOUNTER — Other Ambulatory Visit: Payer: Self-pay | Admitting: Internal Medicine

## 2022-02-26 NOTE — Progress Notes (Incomplete)
Mr. Chad Kirk presents for TRUS/Bx for further investigation of elevated PSA.  Risks, benefits, and some of the potential complications of a transrectal ultrasounds of the prostate (TRUSP) with biopsies were discussed at length with the patient including gross hematuria, blood in the bowel movements, hematospermia, bacteremia, infection, voiding discomfort, urinary retention, fever, chills, sepsis, blood transfusion, death, and others. All questions were answered. Informed consent was obtained. The patient confirmed that he had taken his pre-procedure antibiotic. All anticoagulants were discontinued prior to the procedure. The patient emptied his bladder. He was positioned in a comfortable left lateral decubitus position with hips and knees acutely flexed.  The rectal probe was inserted into the rectum without difficulty. 10cc of 2% Lidocaine without epinephrine was instilled with a spinal needle using ultrasound guidance near the junction of each seminal vesicle and the prostate.  Sequential transverse (axial) scans were made in small increments beginning at the seminal vesicles and ending at the prostatic apex. Sequential longitudinal (saggital) scans were made in small increments beginning at the right lateral prostate and ending at the left lateral prostate. Excellent anatomical imaging was obtained. The peripheral, transitional, and central zones were well-defined. The seminal vesicles were normal.  Prostate volume *** ml.  There were no hypoechoic areas. 12 needle core biopsies were performed. 1 biopsy each was taken from the following areas:  Right lateral base, right medial base, right lateral mid prostate, right medial mid prostate, right lateral apical prostate, right medial apical prostate, left lateral base, left medial base, left lateral mid prostate, left medial mid prostate, left lateral apical prostate, left medial apical prostate.. Minimal prostatic calcifications were noted. Excellent  biopsy specimens were obtained.  Follow-up rectal examination was unremarkable. The procedure was well-tolerated and without complications. Antibiotic instructions were given. The patient was told that:  For several days:  he should increase his fluid intake and limit strenuous activity  he might have mild discomfort at the base of his penis or in his rectum  he might have blood in his urine or blood in his bowel movements  For 2-3 months:  he might have blood in his ejaculate (semen)  Instructions were given to call the office immedicately for blood clots in the urine or bowel movements, difficulty urinating, inability to urinate, urinary retention, painful or frequent urination, fever, chills, nausea, vomiting, or other illness. The patient stated that he understood these instructions and would comply with them. We told the patient that prostate biopsy pathology reports are usually available within 3-5 working days, unless a pathologic second opinion is required, which may take 7-14 days. We told him to contact us to check on the status of his biopsy if he has not heard from [place] within 7 days. The patient left the ultrasound examination room in stable condition.

## 2022-02-27 ENCOUNTER — Other Ambulatory Visit: Payer: 59 | Admitting: Urology

## 2022-02-27 ENCOUNTER — Encounter: Payer: Self-pay | Admitting: *Deleted

## 2022-02-27 ENCOUNTER — Other Ambulatory Visit (HOSPITAL_COMMUNITY): Admission: RE | Admit: 2022-02-27 | Payer: 59 | Source: Ambulatory Visit

## 2022-02-27 ENCOUNTER — Telehealth: Payer: Self-pay

## 2022-02-27 ENCOUNTER — Ambulatory Visit (HOSPITAL_COMMUNITY): Admission: RE | Admit: 2022-02-27 | Payer: 59 | Source: Ambulatory Visit

## 2022-02-27 DIAGNOSIS — R972 Elevated prostate specific antigen [PSA]: Secondary | ICD-10-CM

## 2022-02-27 MED ORDER — LEVOFLOXACIN 750 MG PO TABS: 750.0000 mg | ORAL_TABLET | Freq: Once | ORAL | 0 refills | Status: AC

## 2022-02-27 NOTE — Telephone Encounter (Signed)
Patient was scheduled today for prostate biopsy for [facility] for Dr. Retta Diones.  Unfortunately, patient was scheduled incorrectly at wrong location and patient was unable to have procedure completed at [facility] as requested.   Rescheduled biopsy with patient for 07/18 for [facility] again.  New levaquin sent back to pharmacy and prep instructions reviewed again. Wife and patient voiced understanding

## 2022-03-05 NOTE — Progress Notes (Signed)
61 yo male presents for TUS/Bx for elevated PSA.  Risks, benefits, and some of the potential complications of a transrectal ultrasounds of the prostate (TRUSP) with biopsies were discussed at length with the patient including gross hematuria, blood in the bowel movements, hematospermia, bacteremia, infection, voiding discomfort, urinary retention, fever, chills, sepsis, blood transfusion, death, and others. All questions were answered. Informed consent was obtained. The patient confirmed that he had taken his pre-procedure antibiotic. All anticoagulants were discontinued prior to the procedure. The patient emptied his bladder. He was positioned in a comfortable left lateral decubitus position with hips and knees acutely flexed.  The rectal probe was inserted into the rectum without difficulty. 10cc of 2% Lidocaine without epinephrine was instilled with a spinal needle using ultrasound guidance near the junction of each seminal vesicle and the prostate.  Sequential transverse (axial) scans were made in small increments beginning at the seminal vesicles and ending at the prostatic apex. Sequential longitudinal (saggital) scans were made in small increments beginning at the right lateral prostate and ending at the left lateral prostate. Excellent anatomical imaging was obtained. The peripheral, transitional, and central zones were well-defined. The seminal vesicles were normal.  Prostate volume 35 ml.  There were no hypoechoic areas. 12 needle core biopsies were performed. 1 biopsy each was taken from the following areas:  Right lateral base, right medial base, right lateral mid prostate, right medial mid prostate, right lateral apical prostate, right medial apical prostate, left lateral base, left medial base, left lateral mid prostate, left medial mid prostate, left lateral apical prostate, left medial apical prostate.. Minimal prostatic calcifications were noted. Excellent biopsy specimens were  obtained.  Follow-up rectal examination was unremarkable. The procedure was well-tolerated and without complications. Antibiotic instructions were given. The patient was told that:  For several days:  he should increase his fluid intake and limit strenuous activity  he might have mild discomfort at the base of his penis or in his rectum  he might have blood in his urine or blood in his bowel movements  For 2-3 months:  he might have blood in his ejaculate (semen)  Instructions were given to call the office immedicately for blood clots in the urine or bowel movements, difficulty urinating, inability to urinate, urinary retention, painful or frequent urination, fever, chills, nausea, vomiting, or other illness. The patient stated that he understood these instructions and would comply with them. We told the patient that prostate biopsy pathology reports are usually available within 3-5 working days, unless a pathologic second opinion is required, which may take 7-14 days. We told him to contact us to check on the status of his biopsy if he has not heard from [place] within 7 days. The patient left the ultrasound examination room in stable condition.

## 2022-03-06 ENCOUNTER — Ambulatory Visit (HOSPITAL_BASED_OUTPATIENT_CLINIC_OR_DEPARTMENT_OTHER): Payer: 59 | Admitting: Urology

## 2022-03-06 ENCOUNTER — Other Ambulatory Visit: Payer: Self-pay | Admitting: Urology

## 2022-03-06 ENCOUNTER — Encounter (HOSPITAL_COMMUNITY): Payer: Self-pay

## 2022-03-06 ENCOUNTER — Ambulatory Visit (HOSPITAL_COMMUNITY)
Admission: RE | Admit: 2022-03-06 | Discharge: 2022-03-06 | Disposition: A | Discharge status: Home / Self Care | Payer: 59 | Source: Ambulatory Visit | Attending: Urology | Admitting: Urology

## 2022-03-06 DIAGNOSIS — R972 Elevated prostate specific antigen [PSA]: Secondary | ICD-10-CM | POA: Insufficient documentation

## 2022-03-06 MED ORDER — GENTAMICIN SULFATE 40 MG/ML IJ SOLN: 160.0000 mg | Freq: Once | INTRAMUSCULAR | Status: AC

## 2022-03-06 MED ORDER — LIDOCAINE HCL (PF) 2 % IJ SOLN
INTRAMUSCULAR | Status: AC
  Filled 2022-03-06: qty 10

## 2022-03-06 MED ORDER — GENTAMICIN SULFATE 40 MG/ML IJ SOLN
INTRAMUSCULAR | Status: AC
  Administered 2022-03-06: 160 mg via INTRAMUSCULAR
  Filled 2022-03-06: qty 4

## 2022-03-09 ENCOUNTER — Telehealth: Payer: Self-pay

## 2022-03-09 DIAGNOSIS — R972 Elevated prostate specific antigen [PSA]: Secondary | ICD-10-CM

## 2022-03-09 NOTE — Telephone Encounter (Signed)
Spoke to patient and gave him results per MD and informed him I had mailed apt reminders along with results.

## 2022-03-09 NOTE — Telephone Encounter (Signed)
-----   Message from Marcine Matar, MD sent at 03/09/2022  1:42 PM EDT ----- Please call pt--good news --all of biopsies were benign. I'd like to see him back in 6 mos for repeat PSA--can you put order in? Thanks ----- Message ----- From: Grier Rocher, CMA Sent: 03/07/2022   3:57 PM EDT To: Marcine Matar, MD  Please review.

## 2022-03-09 NOTE — Telephone Encounter (Signed)
Letter sent informing patient of results and his next lab apt for PSA on 09/07/22 and office visit on 09/11/22 for results.

## 2022-03-20 ENCOUNTER — Other Ambulatory Visit: Payer: Self-pay | Admitting: "Endocrinology

## 2022-03-20 DIAGNOSIS — E113592 Type 2 diabetes mellitus with proliferative diabetic retinopathy without macular edema, left eye: Secondary | ICD-10-CM

## 2022-04-13 ENCOUNTER — Ambulatory Visit: Payer: 59 | Admitting: Internal Medicine

## 2022-05-02 ENCOUNTER — Ambulatory Visit: Payer: 59 | Admitting: "Endocrinology

## 2022-05-09 ENCOUNTER — Ambulatory Visit (INDEPENDENT_AMBULATORY_CARE_PROVIDER_SITE_OTHER): Payer: 59 | Admitting: Podiatry

## 2022-05-09 ENCOUNTER — Encounter: Payer: Self-pay | Admitting: Podiatry

## 2022-05-09 DIAGNOSIS — M79675 Pain in left toe(s): Secondary | ICD-10-CM | POA: Diagnosis not present

## 2022-05-09 DIAGNOSIS — E782 Mixed hyperlipidemia: Secondary | ICD-10-CM

## 2022-05-09 DIAGNOSIS — B351 Tinea unguium: Secondary | ICD-10-CM

## 2022-05-09 DIAGNOSIS — E1142 Type 2 diabetes mellitus with diabetic polyneuropathy: Secondary | ICD-10-CM | POA: Diagnosis not present

## 2022-05-09 DIAGNOSIS — M79674 Pain in right toe(s): Secondary | ICD-10-CM | POA: Diagnosis not present

## 2022-05-09 DIAGNOSIS — Z794 Long term (current) use of insulin: Secondary | ICD-10-CM | POA: Diagnosis not present

## 2022-05-09 DIAGNOSIS — I10 Essential (primary) hypertension: Secondary | ICD-10-CM | POA: Diagnosis not present

## 2022-05-09 DIAGNOSIS — L84 Corns and callosities: Secondary | ICD-10-CM

## 2022-05-09 DIAGNOSIS — I739 Peripheral vascular disease, unspecified: Secondary | ICD-10-CM

## 2022-05-09 NOTE — Progress Notes (Addendum)
  Subjective:  Patient ID: Chad Kirk, Chad Kirk    DOB: [birth year],  MRN: [id number]  Chad Kirk presents to clinic today for at risk foot care with history of diabetic neuropathy and callus(es) left lower extremity and painful thick toenails that are difficult to trim. Painful toenails interfere with ambulation. Aggravating factors include wearing enclosed shoe gear. Pain is relieved with periodic professional debridement. Painful calluses are aggravated when weightbearing with and without shoegear. Pain is relieved with periodic professional debridement.  Patient states blood glucose was 109 mg/dl today. Last known  HgA1c was nearly 7%.    New problem(s): None.   PCP is [name], MD , and last visit was  February 23, 2022.  No Known Allergies  Review of Systems: Negative except as noted in the HPI.  Objective: No changes noted in today's physical examination. Chad Kirk is a pleasant 61 y.o. Chad Kirk in NAD. AAO x 3.  Vascular Examination: CFT <3 seconds b/l LE. Faintly palpable DP pulses b/l. Nonpalpable PT pulses b/l. Pedal hair absent b/l. Skin temperature gradient WNL b/l. No pain with calf compression b/l. No edema b/l LE. No cyanosis or clubbing noted b/l LE.  Dermatological Examination: Pedal integument with normal turgor, texture and tone BLE. No open wounds b/l LE. No interdigital macerations noted b/l LE. Toenails 1-5  left, 1, 3-5 right elongated, discolored, dystrophic, thickened, crumbly with subungual debris and tenderness to dorsal palpation. Preulcerative callus distal tip left hallux with subdermal hemorrhage. Hyperkeratotic lesion medial IPJ right great toe.No erythema, no edema, no drainage, no fluctuance.  Healed laceration plantar aspect of left heel from previous area of trauma. No erythema, no edema, no pain, no fluctuance, no drainage.  Musculoskeletal Examination: Normal muscle strength 5/5 to all lower extremity muscle groups bilaterally. HAV with bunion deformity  noted b/l LE. Hammertoe deformity noted 2-5 b/l.Marland Kitchen No pain, crepitus or joint limitation noted with ROM b/l LE.  Patient ambulates independently without assistive aids.  Neurological Examination: Protective sensation diminished with 10g monofilament b/l.  Assessment/Plan: 1. Pain due to onychomycosis of toenails of both feet   2. Callus   3. Pre-ulcerative calluses   4. Mixed hyperlipidemia   5. Essential hypertension   6. Type 2 diabetes mellitus with diabetic polyneuropathy, with long-term current use of insulin ([place])   -Examined patient. -Patient flagged in [place] for Limb at Risk. Ordered noninvasive arterial studies ABIs with and without TBIs for b/l lower extremities. -Stressed the importance of good glycemic control and the detriment of not  controlling glucose levels in relation to the foot. -Patient to continue soft, supportive shoe gear daily. -Toenails 1-5 b/l were debrided in length and girth with sterile nail nippers and dremel without iatrogenic bleeding.  -Callus(es) right great toe pared utilizing sterile scalpel blade without complication or incident. Total number debrided =1. -Preulcerative lesion pared distal tip of left great toe utilizing sterile scalpel blade. Total number pared=1. -Patient/POA to call should there be question/concern in the interim.   Return in about 3 months (around 08/08/2022).  Marzetta Board, DPM

## 2022-05-12 ENCOUNTER — Other Ambulatory Visit: Payer: Self-pay | Admitting: Internal Medicine

## 2022-05-12 DIAGNOSIS — N400 Enlarged prostate without lower urinary tract symptoms: Secondary | ICD-10-CM

## 2022-05-16 ENCOUNTER — Ambulatory Visit: Payer: 59 | Admitting: "Endocrinology

## 2022-05-16 ENCOUNTER — Ambulatory Visit (HOSPITAL_COMMUNITY)
Admission: RE | Admit: 2022-05-16 | Discharge: 2022-05-16 | Disposition: A | Discharge status: Home / Self Care | Payer: 59 | Source: Ambulatory Visit | Attending: Podiatry | Admitting: Podiatry

## 2022-05-16 DIAGNOSIS — I10 Essential (primary) hypertension: Secondary | ICD-10-CM | POA: Insufficient documentation

## 2022-05-16 DIAGNOSIS — E1142 Type 2 diabetes mellitus with diabetic polyneuropathy: Secondary | ICD-10-CM | POA: Diagnosis present

## 2022-05-16 DIAGNOSIS — Z794 Long term (current) use of insulin: Secondary | ICD-10-CM | POA: Diagnosis present

## 2022-05-16 DIAGNOSIS — E782 Mixed hyperlipidemia: Secondary | ICD-10-CM | POA: Diagnosis present

## 2022-05-17 LAB — COMPREHENSIVE METABOLIC PANEL
ALT: 31 IU/L (ref 0–44)
AST: 24 IU/L (ref 0–40)
Albumin/Globulin Ratio: 1.5 (ref 1.2–2.2)
Albumin: 4.3 g/dL (ref 3.9–4.9)
Alkaline Phosphatase: 72 IU/L (ref 44–121)
BUN/Creatinine Ratio: 15 (ref 10–24)
BUN: 17 mg/dL (ref 8–27)
Bilirubin Total: 0.3 mg/dL (ref 0.0–1.2)
CO2: 24 mmol/L (ref 20–29)
Calcium: 9.6 mg/dL (ref 8.6–10.2)
Chloride: 103 mmol/L (ref 96–106)
Creatinine, Ser: 1.15 mg/dL (ref 0.76–1.27)
Globulin, Total: 2.9 g/dL (ref 1.5–4.5)
Glucose: 137 mg/dL — ABNORMAL HIGH (ref 70–99)
Potassium: 5 mmol/L (ref 3.5–5.2)
Sodium: 143 mmol/L (ref 134–144)
Total Protein: 7.2 g/dL (ref 6.0–8.5)
eGFR: 72 mL/min/{1.73_m2} (ref >59)

## 2022-05-17 LAB — LIPID PANEL
Chol/HDL Ratio: 3.4 ratio (ref 0.0–5.0)
Cholesterol, Total: 121 mg/dL (ref 100–199)
HDL: 36 mg/dL — ABNORMAL LOW (ref >39)
LDL Chol Calc (NIH): 72 mg/dL (ref 0–99)
Triglycerides: 60 mg/dL (ref 0–149)
VLDL Cholesterol Cal: 13 mg/dL (ref 5–40)

## 2022-05-21 ENCOUNTER — Encounter: Payer: Self-pay | Admitting: "Endocrinology

## 2022-05-21 ENCOUNTER — Ambulatory Visit (INDEPENDENT_AMBULATORY_CARE_PROVIDER_SITE_OTHER): Payer: 59 | Admitting: "Endocrinology

## 2022-05-21 VITALS — BP 143/88 | HR 84 | Ht 69.0 in | Wt 180.2 lb

## 2022-05-21 DIAGNOSIS — E559 Vitamin D deficiency, unspecified: Secondary | ICD-10-CM

## 2022-05-21 DIAGNOSIS — I10 Essential (primary) hypertension: Secondary | ICD-10-CM

## 2022-05-21 DIAGNOSIS — Z794 Long term (current) use of insulin: Secondary | ICD-10-CM | POA: Diagnosis not present

## 2022-05-21 DIAGNOSIS — E782 Mixed hyperlipidemia: Secondary | ICD-10-CM | POA: Diagnosis not present

## 2022-05-21 DIAGNOSIS — E113592 Type 2 diabetes mellitus with proliferative diabetic retinopathy without macular edema, left eye: Secondary | ICD-10-CM | POA: Diagnosis not present

## 2022-05-21 LAB — POCT GLYCOSYLATED HEMOGLOBIN (HGB A1C): HbA1c, POC (controlled diabetic range): 8.2 % — AB (ref 0.0–7.0)

## 2022-05-21 MED ORDER — LANTUS SOLOSTAR 100 UNIT/ML ~~LOC~~ SOPN: 28.0000 [IU] | PEN_INJECTOR | Freq: Every day | SUBCUTANEOUS | 1 refills | Status: DC

## 2022-05-21 MED ORDER — FREESTYLE LIBRE 2 SENSOR MISC: 2 refills | Status: DC

## 2022-05-21 MED ORDER — VITAMIN D3 125 MCG (5000 UT) PO CAPS: 5000.0000 [IU] | ORAL_CAPSULE | Freq: Every day | ORAL | 1 refills | Status: AC

## 2022-05-21 MED ORDER — LANTUS SOLOSTAR 100 UNIT/ML ~~LOC~~ SOPN
PEN_INJECTOR | SUBCUTANEOUS | 2 refills | Status: DC
Start: 2022-05-21 — End: 2022-05-21

## 2022-05-21 NOTE — Patient Instructions (Signed)

## 2022-05-21 NOTE — Progress Notes (Signed)
05/21/2022, 10:55 AM  Endocrinology follow-up note   Subjective:    Patient ID: Chad Kirk, male    DOB: Aug 11, 1961.  Chad Kirk is being seen in follow-up after he was seen in consultation for management of currently uncontrolled symptomatic diabetes requested by  Chad Spar, MD.   Past Medical History:  Diagnosis Date   Diabetes mellitus without complication ([place])    DIAGNOSED AGE 61   Diabetic ulcer of left great toe ([place]) 01/25/2021   Healed diabetic foot ulcer 02/22/2021   Hypertension     Past Surgical History:  Procedure Laterality Date   ESOPHAGEAL DILATION N/A 12/07/2019   Procedure: ESOPHAGEAL OR PYLORIC DILATION;  Surgeon: Danie Binder, MD;  Location: [facility];  Service: Endoscopy;  Laterality: N/A;   ESOPHAGOGASTRODUODENOSCOPY N/A 12/07/2019   erosive gastritis, many non-bleeding cratered and superficial duodenal ulcers without stigmata of bleeding in duodenal bulb. Empiric dilation due to possible occult esophageal web. Negative H.pylori.    EYE SURGERY  03/02/2020   diabetic retinopathy, lens placement, cataract removal, right eye   HERNIA REPAIR Left 1990    Social History   Socioeconomic History   Marital status: Married    Spouse name: Not on file   Number of children: Not on file   Years of education: Not on file   Highest education level: Not on file  Occupational History   Not on file  Tobacco Use   Smoking status: Former    Packs/day: 1.00    Years: 5.00    Total pack years: 5.00    Types: Cigarettes    Quit date: 08/20/1988    Years since quitting: 33.7   Smokeless tobacco: Never  Vaping Use   Vaping Use: Never used  Substance and Sexual Activity   Alcohol use: Not Currently    Comment: denied 03/15/20   Drug use: No   Sexual activity: Not on file  Other Topics Concern   Not on file  Social History Narrative   Not on file   Social  Determinants of Health   Financial Resource Strain: Not on file  Food Insecurity: Not on file  Transportation Needs: Not on file  Physical Activity: Not on file  Stress: Not on file  Social Connections: Not on file    Family History  Problem Relation Age of Onset   Stroke Mother    Hypertension Mother    Diabetes Mother    Hypertension Father    Diabetes Father    Colon cancer Neg Hx    Colon polyps Neg Hx     Outpatient Encounter Medications as of 05/21/2022  Medication Sig   Cholecalciferol (VITAMIN D3) 125 MCG (5000 UT) CAPS Take 1 capsule (5,000 Units total) by mouth daily.   amLODipine (NORVASC) 10 MG tablet Take 1 tablet (10 mg total) by mouth daily.   atropine 1 % ophthalmic solution SMARTSIG:1 Drop(s) Left Eye Morning-Night   benzonatate (TESSALON) 100 MG capsule Take 1 capsule (100 mg total) by mouth 2 (two) times daily as needed for cough.   blood glucose meter kit and supplies KIT 1  each by Does not apply route 4 (four) times daily. Dispense based on patient and insurance preference. Use up to four times daily as directed.   Blood Glucose Monitoring Suppl (ACCU-CHEK GUIDE ME) w/Device KIT 1 Piece by Does not apply route as directed.   Carboxymethylcellulose Sodium (LUBRICANT EYE DROPS OP) Apply 1 Container to eye as needed (dry eye). Left eye   Continuous Blood Gluc Receiver (FREESTYLE LIBRE 2 READER) DEVI As directed   Continuous Blood Gluc Sensor (FREESTYLE LIBRE 2 SENSOR) MISC Use to monitor glucose continuously   dorzolamide (TRUSOPT) 2 % ophthalmic solution Place 1 drop into the left eye 3 (three) times daily.   finasteride (PROSCAR) 5 MG tablet Take 1 tablet by mouth once daily   FUROSEMIDE PO Take by mouth daily.   glipiZIDE (GLIPIZIDE XL) 5 MG 24 hr tablet Take 1 tablet (5 mg total) by mouth daily with breakfast.   glucose blood (ONETOUCH VERIO) test strip USE 1 STRIP TO CHECK GLUCOSE UP TO 2 TIMES DAILY   Insulin Pen Needle (B-D ULTRAFINE III SHORT PEN) 31G X 8  MM MISC 1 each by Does not apply route as directed.   Lancets (ONETOUCH DELICA PLUS [id number]) MISC Apply topically.   LANTUS SOLOSTAR 100 UNIT/ML Solostar Pen Inject 28 Units into the skin at bedtime. INJECT 28 UNITS SUBCUTANEOUSLY AT BEDTIME   losartan (COZAAR) 100 MG tablet Take 1 tablet (100 mg total) by mouth daily.   Multiple Vitamins-Minerals (QC DAILY MULTIVIT/MULTIMINERAL PO) Take 1 tablet by mouth daily.   prednisoLONE acetate (PRED FORTE) 1 % ophthalmic suspension 1 drop 4 (four) times daily.   Probiotic Product (PROBIOTIC PO) Take 1 tablet by mouth daily.   rosuvastatin (CRESTOR) 10 MG tablet Take 1 tablet (10 mg total) by mouth daily.   senna-docusate (SENOKOT-S) 8.6-50 MG tablet Take 1 tablet by mouth as needed.    tadalafil (CIALIS) 20 MG tablet Take 1 tablet (20 mg total) by mouth daily as needed for erectile dysfunction.   tamsulosin (FLOMAX) 0.4 MG CAPS capsule TAKE 1 CAPSULE BY MOUTH  DAILY   timolol (TIMOPTIC) 0.5 % ophthalmic solution Place 1 drop into both eyes 3 times daily.   UNABLE TO FIND Take 5 mLs by mouth every 4 (four) hours as needed. Med Name: Apothecary Cough Syrup Take 5 mL by mouth every 4 to 6 hours as needed for cough   [DISCONTINUED] Continuous Blood Gluc Sensor (FREESTYLE LIBRE 2 SENSOR) MISC CHANGE SENSOR EVERY 14 DAYS   [DISCONTINUED] LANTUS SOLOSTAR 100 UNIT/ML Solostar Pen INJECT 24 UNITS SUBCUTANEOUSLY AT BEDTIME   [DISCONTINUED] LANTUS SOLOSTAR 100 UNIT/ML Solostar Pen INJECT 28 UNITS SUBCUTANEOUSLY AT BEDTIME   No facility-administered encounter medications on file as of 05/21/2022.    ALLERGIES: No Known Allergies  VACCINATION STATUS: Immunization History  Administered Date(s) Administered   Fluad Quad(high Dose 65+) 06/27/2021   PFIZER(Purple Top)SARS-COV-2 Vaccination 10/17/2019, 11/07/2019   Pneumococcal Conjugate-13 08/08/2021   Pneumococcal Polysaccharide-23 06/30/2020   Tdap 02/03/2012   Zoster Recombinat (Shingrix) 10/05/2021,  11/23/2021    Diabetes He presents for his follow-up diabetic visit. He has type 2 diabetes mellitus. Onset time: He was diagnosed at approximate age of 34 years. His disease course has been improving. Pertinent negatives for hypoglycemia include no confusion, headaches, nervousness/anxiousness, pallor, seizures, sweats or tremors. Pertinent negatives for diabetes include no blurred vision, no chest pain, no fatigue, no polydipsia, no polyphagia, no polyuria and no weakness. There are no hypoglycemic complications. Symptoms are improving. Diabetic complications include impotence,  nephropathy, peripheral neuropathy, PVD and retinopathy. (He is reporting legal blindness from diabetic retinopathy on both eyes.) Risk factors for coronary artery disease include dyslipidemia, diabetes mellitus, family history, male sex, hypertension and sedentary lifestyle. Current diabetic treatments: He is currently on Lantus 15-20 units 1-2 times a day. His weight is fluctuating minimally. He is following a generally unhealthy diet. When asked about meal planning, he reported none. His home blood glucose trend is decreasing steadily. His breakfast blood glucose range is generally 140-180 mg/dl. His lunch blood glucose range is generally 140-180 mg/dl. His dinner blood glucose range is generally 140-180 mg/dl. His bedtime blood glucose range is generally 140-180 mg/dl. His overall blood glucose range is 140-180 mg/dl. (Chad Kirk presents with his CGM device showing continued improvement in his glycemic profile.  His point-of-care A1c is 8.2%.  His CGM analysis shows AGP of 64% time in range, 23% level 1 hyperglycemia, 12% level 2 hyperglycemia.  His average blood glucose for the last 14 days is 169.  ) He does not see a podiatrist.Eye exam is current.  Hyperlipidemia This is a chronic problem. The current episode started more than 1 year ago. The problem is uncontrolled. Recent lipid tests were reviewed and are high. Exacerbating  diseases include chronic renal disease and diabetes. Pertinent negatives include no chest pain, myalgias or shortness of breath. Current antihyperlipidemic treatment includes statins. Risk factors for coronary artery disease include diabetes mellitus, dyslipidemia, hypertension, male sex, a sedentary lifestyle and family history.  Hypertension This is a chronic problem. The current episode started more than 1 year ago. The problem is controlled. Pertinent negatives include no blurred vision, chest pain, headaches, neck pain, palpitations, shortness of breath or sweats. Risk factors for coronary artery disease include dyslipidemia, family history, diabetes mellitus, male gender and sedentary lifestyle. Past treatments include calcium channel blockers. Hypertensive end-organ damage includes kidney disease, PVD and retinopathy. Identifiable causes of hypertension include chronic renal disease.     Review of Systems  Constitutional:  Negative for chills, fatigue, fever and unexpected weight change.  HENT:  Negative for dental problem, mouth sores and trouble swallowing.   Eyes:  Negative for blurred vision and visual disturbance.  Respiratory:  Negative for cough, choking, chest tightness, shortness of breath and wheezing.   Cardiovascular:  Negative for chest pain, palpitations and leg swelling.  Gastrointestinal:  Negative for abdominal distention, abdominal pain, constipation, diarrhea, nausea and vomiting.  Endocrine: Negative for polydipsia, polyphagia and polyuria.  Genitourinary:  Positive for impotence. Negative for dysuria, flank pain, hematuria and urgency.  Musculoskeletal:  Negative for back pain, gait problem, myalgias and neck pain.  Skin:  Negative for pallor, rash and wound.  Neurological:  Negative for tremors, seizures, syncope, weakness, numbness and headaches.  Psychiatric/Behavioral:  Negative for confusion and dysphoric mood. The patient is not nervous/anxious.     Objective:        05/21/2022   10:02 AM 03/06/2022    8:12 AM 02/23/2022    8:54 AM  Vitals with BMI  Height _0     Weight 180 lbs 3 oz    BMI 60.6    Systolic 301 601 093  Diastolic 88 235 94  Pulse 84 80     BP (!) 143/88   Pulse 84   Ht _1  (1.753 m)   Wt 180 lb 3.2 oz (81.7 kg)   BMI 26.61 kg/m   Wt Readings from Last 3 Encounters:  05/21/22 180 lb 3.2 oz (81.7 kg)  02/23/22  186 lb 9.6 oz (84.6 kg)  01/30/22 183 lb 9.6 oz (83.3 kg)       CMP ( most recent) CMP     Component Value Date/Time   NA 143 05/16/2022 1027   K 5.0 05/16/2022 1027   CL 103 05/16/2022 1027   CO2 24 05/16/2022 1027   GLUCOSE 137 (H) 05/16/2022 1027   GLUCOSE 256 (H) 12/30/2019 1218   BUN 17 05/16/2022 1027   CREATININE 1.15 05/16/2022 1027   CALCIUM 9.6 05/16/2022 1027   PROT 7.2 05/16/2022 1027   ALBUMIN 4.3 05/16/2022 1027   AST 24 05/16/2022 1027   ALT 31 05/16/2022 1027   ALKPHOS 72 05/16/2022 1027   BILITOT 0.3 05/16/2022 1027   GFRNONAA >60 12/30/2019 1218   GFRAA >60 12/30/2019 1218     Diabetic Labs (most recent): Lab Results  Component Value Date   HGBA1C 8.2 (A) 05/21/2022   HGBA1C 8.7 (A) 01/30/2022   HGBA1C 8.9 (A) 10/05/2021     Lipid Panel ( most recent) Lipid Panel     Component Value Date/Time   CHOL 121 05/16/2022 1027   TRIG 60 05/16/2022 1027   HDL 36 (L) 05/16/2022 1027   CHOLHDL 3.4 05/16/2022 1027   CHOLHDL 4.4 12/30/2019 1218   VLDL 15 12/30/2019 1218   LDLCALC 72 05/16/2022 1027   LABVLDL 13 05/16/2022 1027      Assessment & Plan:   1. Type 2 diabetes, uncontrolled, with retinopathy with macular edema ([facility])  - Chad Kirk has currently uncontrolled symptomatic type 2 DM since  61 years of age.  Chad Kirk presents with his CGM device showing continued improvement in his glycemic profile.  His point-of-care A1c is 8.2%.  His CGM analysis shows AGP of 64% time in range, 23% level 1 hyperglycemia, 12% level 2 hyperglycemia.  His average blood glucose  for the last 14 days is 169.    - I had a long discussion with him about the progressive nature of diabetes and the pathology behind its complications. -his diabetes is complicated by retinopathy, peripheral arterial disease, peripheral neuropathy and he remains at a high risk for more acute and chronic complications which include CAD, CVA, CKD, retinopathy, and neuropathy. These are all discussed in detail with him.  - I have counseled him on diet  and weight management  by adopting a carbohydrate restricted/protein rich diet. Patient is encouraged to switch to  unprocessed or minimally processed     complex starch and increased protein intake (animal or plant source), fruits, and vegetables. -  he is advised to stick to a routine mealtimes to eat 3 meals  a day and avoid unnecessary snacks ( to snack only to correct hypoglycemia).   The following Lifestyle Medicine recommendations according to [place]  [facility]) were discussed and and offered to patient and he  agrees to start the journey:   - he acknowledges that there is a room for improvement in his food and drink choices. - Suggestion is made for him to avoid simple carbohydrates  from his diet including Cakes, Sweet Desserts, Ice Cream, Soda (diet and regular), Sweet Tea, Candies, Chips, Cookies, Store Bought Juices, Alcohol , Artificial Sweeteners,  Coffee Creamer, and "Sugar-free" Products, Lemonade. This will help patient to have more stable blood glucose profile and potentially avoid unintended weight gain.  The following Lifestyle Medicine recommendations according to [place]  [facility]) were discussed and and offered to patient and  he  agrees to start the journey:  A. Whole Foods, Plant-Based Nutrition comprising of fruits and vegetables, plant-based proteins, whole-grain carbohydrates was discussed in detail with the patient.   A list for source of those nutrients were also provided  to the patient.  Patient will use only water or unsweetened tea for hydration. B.  The need to stay away from risky substances including alcohol, smoking; obtaining 7 to 9 hours of restorative sleep, at least 150 minutes of moderate intensity exercise weekly, the importance of healthy social connections,  and stress management techniques were discussed. C.  A full color page of  Calorie density of various food groups per pound showing examples of each food groups was provided to the patient.    - he will be scheduled with Chad Kirk, Chad Kirk, Chad Kirk for diabetes education.  - I have approached him with the following individualized plan to manage  his diabetes and patient agrees:   -In light of his presentation with significantly better  glycemic profile, he will not need prandial  insulin for now. He is advised to increase Lantus to 28 units nightly.   He is advised to monitor glucose continuously. - he is encouraged to call clinic for blood glucose levels less than 70 or above 200 mg /dl.  - he is warned not to take insulin without proper monitoring per orders.  -He has responded and benefited from low-dose glipizide.  He is advised to continue glipizide 5 mg XL p.o. daily at breakfast.      - Specific targets for  A1c;  LDL, HDL,  and Triglycerides were discussed with the patient.  2) Blood Pressure /Hypertension: -His blood pressure is controlled to target. he is advised to continue his current medications including amlodipine 10 mg p.o.. daily with breakfast .  He will be considered for HCTZ/lisinopril next visit.   3) Lipids/Hyperlipidemia:   Review of his recent lipid panel showed significantly improved LDL at 72 from 149.  Is mainly due to his dietary change.  He is advised to continue Crestor 10 mg p.o. daily at bedtime   Side effects and precautions discussed with him.     4)  Weight/Diet:  Body mass index is 26.61 kg/m.  -   he is not a candidate for weight loss. Exercise, and  detailed carbohydrates information provided  -  detailed on discharge instructions.  5) Chronic Care/Health Maintenance:  -he  is on  Statin medications and  is encouraged to initiate and continue to follow up with Ophthalmology, Dentist,  Podiatrist at least yearly or according to recommendations, and advised to   stay away from smoking. I have recommended yearly flu vaccine and pneumonia vaccine at least every 5 years; moderate intensity exercise for up to 150 minutes weekly; and  sleep for at least 7 hours a day.  6) vitamin D deficiency: He is being initiated on vitamin D3 5000 units daily for the next 6 months.  His screening ABI was normal in September 2021.  He has left lower extremity diabetic foot ulcer, under care with podiatry.    His next study is due in September 2027, or sooner if needed.  - he is  advised to maintain close follow up with his pcp for primary care needs, as well as his other providers for optimal and coordinated care   I spent 41 minutes in the care of the patient today including review of labs from CMP, Lipids, Thyroid Function, Hematology (current and previous including abstractions  from other facilities); face-to-face time discussing  his blood glucose readings/logs, discussing hypoglycemia and hyperglycemia episodes and symptoms, medications doses, his options of short and long term treatment based on the latest standards of care / guidelines;  discussion about incorporating lifestyle medicine;  and documenting the encounter. Risk reduction counseling performed per USPSTF guidelines to reduce obesity and cardiovascular risk factors.     Please refer to Patient Instructions for Blood Glucose Monitoring and Insulin/Medications Dosing Guide"  in media tab for additional information. Please  also refer to " Patient Self Inventory" in the Media  tab for reviewed elements of pertinent patient history.  Chad Kirk participated in the discussions, expressed  understanding, and voiced agreement with the above plans.  All questions were answered to his satisfaction. he is encouraged to contact clinic should he have any questions or concerns prior to his return visit.   Follow up plan: - Return in about 4 months (around 09/21/2022) for Bring Meter/CGM Device/Logs- A1c in Office.  Glade Lloyd, MD [facility] [address] [place], [place] [address] Phone: [phone]  Fax: [phone]    05/21/2022, 10:55 AM  This note was partially dictated with voice recognition software. Similar sounding words can be transcribed inadequately or may not  be corrected upon review.

## 2022-05-22 ENCOUNTER — Other Ambulatory Visit: Payer: Self-pay | Admitting: Internal Medicine

## 2022-05-22 ENCOUNTER — Telehealth: Payer: Self-pay | Admitting: Internal Medicine

## 2022-05-22 DIAGNOSIS — N529 Male erectile dysfunction, unspecified: Secondary | ICD-10-CM

## 2022-05-22 MED ORDER — SILDENAFIL CITRATE 100 MG PO TABS: 100.0000 mg | ORAL_TABLET | Freq: Every day | ORAL | 2 refills | Status: DC | PRN

## 2022-05-22 NOTE — Telephone Encounter (Signed)
Pt advised with verbal understanding  °

## 2022-05-22 NOTE — Telephone Encounter (Signed)
Patient called in regard to tadalafil (CIALIS) 20 MG tablet    Pharm only has 10mg     Can switch to SILDENAFIL if needed.  Patient wants a call back in regard

## 2022-05-29 ENCOUNTER — Ambulatory Visit (INDEPENDENT_AMBULATORY_CARE_PROVIDER_SITE_OTHER): Payer: 59 | Admitting: Internal Medicine

## 2022-05-29 ENCOUNTER — Encounter: Payer: Self-pay | Admitting: Internal Medicine

## 2022-05-29 VITALS — BP 144/88 | HR 84 | Resp 16 | Ht 70.0 in | Wt 182.4 lb

## 2022-05-29 DIAGNOSIS — I10 Essential (primary) hypertension: Secondary | ICD-10-CM

## 2022-05-29 DIAGNOSIS — E113592 Type 2 diabetes mellitus with proliferative diabetic retinopathy without macular edema, left eye: Secondary | ICD-10-CM | POA: Diagnosis not present

## 2022-05-29 DIAGNOSIS — E782 Mixed hyperlipidemia: Secondary | ICD-10-CM | POA: Diagnosis not present

## 2022-05-29 DIAGNOSIS — Z23 Encounter for immunization: Secondary | ICD-10-CM | POA: Diagnosis not present

## 2022-05-29 DIAGNOSIS — Z794 Long term (current) use of insulin: Secondary | ICD-10-CM

## 2022-05-29 NOTE — Progress Notes (Unsigned)
Established Patient Office Visit  Subjective:  Patient ID: Chad Kirk, male    DOB: 11/03/1960  Age: 61 y.o. MRN: [id number]  CC:  Chief Complaint  Patient presents with   Hypertension   Diabetes    Follow up visit     HPI Chad Kirk is a 61 y.o. male with past medical history of diabetes mellitus type 2, PVD, hypertension, hyperlipidemia, diabetic macular edema, BPH and alcohol abuse who presents for f/u of his chronic medical conditions.  HTN: His BP was  elevated today.  His BP has been elevated during recent office visit. Takes medications regularly - Losartan 100 mg QD.  He was recently placed on amlodipine 5 mg daily, but has run out of it for the last 1 week.  Of note, he used to take amlodipine 10 mg QD in the past.  Patient denies headache, dizziness, chest pain, dyspnea or palpitations.   Type II DM: He follows up with Dr. Dorris Fetch for diabetes management.  He takes Lantus 24 units nightly now.  He also takes glipizide 5 mg QD. He follows up with ophthalmologist for diabetic macular edema and his vision has improved now. Denies any polyuria, polyphagia or fatigue.    Past Medical History:  Diagnosis Date   Diabetes mellitus without complication ([place])    DIAGNOSED AGE 1   Diabetic ulcer of left great toe ([place]) 01/25/2021   Healed diabetic foot ulcer 02/22/2021   Hypertension     Past Surgical History:  Procedure Laterality Date   ESOPHAGEAL DILATION N/A 12/07/2019   Procedure: ESOPHAGEAL OR PYLORIC DILATION;  Surgeon: Danie Binder, MD;  Location[facility];  Service: Endoscopy;  Laterality: N/A;   ESOPHAGOGASTRODUODENOSCOPY N/A 12/07/2019   erosive gastritis, many non-bleeding cratered and superficial duodenal ulcers without stigmata of bleeding in duodenal bulb. Empiric dilation due to possible occult esophageal web. Negative H.pylori.    EYE SURGERY  03/02/2020   diabetic retinopathy, lens placement, cataract removal, right eye   HERNIA REPAIR Left 1990     Family History  Problem Relation Age of Onset   Stroke Mother    Hypertension Mother    Diabetes Mother    Hypertension Father    Diabetes Father    Colon cancer Neg Hx    Colon polyps Neg Hx     Social History   Socioeconomic History   Marital status: Married    Spouse name: Not on file   Number of children: Not on file   Years of education: Not on file   Highest education level: Not on file  Occupational History   Not on file  Tobacco Use   Smoking status: Former    Packs/day: 1.00    Years: 5.00    Total pack years: 5.00    Types: Cigarettes    Quit date: 08/20/1988    Years since quitting: 33.7   Smokeless tobacco: Never  Vaping Use   Vaping Use: Never used  Substance and Sexual Activity   Alcohol use: Not Currently    Comment: denied 03/15/20   Drug use: No   Sexual activity: Not on file  Other Topics Concern   Not on file  Social History Narrative   Not on file   Social Determinants of Health   Financial Resource Strain: Not on file  Food Insecurity: Not on file  Transportation Needs: Not on file  Physical Activity: Not on file  Stress: Not on file  Social Connections: Not on file  Intimate  Partner Violence: Not on file    Outpatient Medications Prior to Visit  Medication Sig Dispense Refill   amLODipine (NORVASC) 10 MG tablet Take 1 tablet (10 mg total) by mouth daily. 90 tablet 3   atropine 1 % ophthalmic solution SMARTSIG:1 Drop(s) Left Eye Morning-Night     benzonatate (TESSALON) 100 MG capsule Take 1 capsule (100 mg total) by mouth 2 (two) times daily as needed for cough. 20 capsule 0   blood glucose meter kit and supplies KIT 1 each by Does not apply route 4 (four) times daily. Dispense based on patient and insurance preference. Use up to four times daily as directed. 1 each 0   Blood Glucose Monitoring Suppl (ACCU-CHEK GUIDE ME) w/Device KIT 1 Piece by Does not apply route as directed. 1 kit 0   Carboxymethylcellulose Sodium (LUBRICANT EYE  DROPS OP) Apply 1 Container to eye as needed (dry eye). Left eye     Cholecalciferol (VITAMIN D3) 125 MCG (5000 UT) CAPS Take 1 capsule (5,000 Units total) by mouth daily. 90 capsule 1   Continuous Blood Gluc Receiver (FREESTYLE LIBRE 2 READER) DEVI As directed 1 each 0   Continuous Blood Gluc Sensor (FREESTYLE LIBRE 2 SENSOR) MISC Use to monitor glucose continuously 2 each 2   dorzolamide (TRUSOPT) 2 % ophthalmic solution Place 1 drop into the left eye 3 (three) times daily.     finasteride (PROSCAR) 5 MG tablet Take 1 tablet by mouth once daily 30 tablet 0   FUROSEMIDE PO Take by mouth daily.     glipiZIDE (GLIPIZIDE XL) 5 MG 24 hr tablet Take 1 tablet (5 mg total) by mouth daily with breakfast. 90 tablet 1   glucose blood (ONETOUCH VERIO) test strip USE 1 STRIP TO CHECK GLUCOSE UP TO 2 TIMES DAILY 100 each 2   Insulin Pen Needle (B-D ULTRAFINE III SHORT PEN) 31G X 8 MM MISC 1 each by Does not apply route as directed. 100 each 3   Lancets (ONETOUCH DELICA PLUS [id number]) MISC Apply topically.     LANTUS SOLOSTAR 100 UNIT/ML Solostar Pen Inject 28 Units into the skin at bedtime. INJECT 28 UNITS SUBCUTANEOUSLY AT BEDTIME 15 mL 1   losartan (COZAAR) 100 MG tablet Take 1 tablet (100 mg total) by mouth daily. 90 tablet 1   Multiple Vitamins-Minerals (QC DAILY MULTIVIT/MULTIMINERAL PO) Take 1 tablet by mouth daily.     prednisoLONE acetate (PRED FORTE) 1 % ophthalmic suspension 1 drop 4 (four) times daily.     Probiotic Product (PROBIOTIC PO) Take 1 tablet by mouth daily.     rosuvastatin (CRESTOR) 10 MG tablet Take 1 tablet (10 mg total) by mouth daily. 90 tablet 1   senna-docusate (SENOKOT-S) 8.6-50 MG tablet Take 1 tablet by mouth as needed.      sildenafil (VIAGRA) 100 MG tablet Take 1 tablet (100 mg total) by mouth daily as needed for erectile dysfunction. 10 tablet 2   tamsulosin (FLOMAX) 0.4 MG CAPS capsule TAKE 1 CAPSULE BY MOUTH  DAILY 90 capsule 3   timolol (TIMOPTIC) 0.5 % ophthalmic  solution Place 1 drop into both eyes 3 times daily.     UNABLE TO FIND Take 5 mLs by mouth every 4 (four) hours as needed. Med Name: Apothecary Cough Syrup Take 5 mL by mouth every 4 to 6 hours as needed for cough 120 mL 0   No facility-administered medications prior to visit.    No Known Allergies  ROS Review of Systems  Objective:    Physical Exam  BP (!) 148/90   Pulse 84   Resp 16   Ht '5\' 10"'  (1.778 m)   Wt 182 lb 6.4 oz (82.7 kg)   SpO2 97%   BMI 26.17 kg/m  Wt Readings from Last 3 Encounters:  05/29/22 182 lb 6.4 oz (82.7 kg)  05/21/22 180 lb 3.2 oz (81.7 kg)  02/23/22 186 lb 9.6 oz (84.6 kg)    Lab Results  Component Value Date   TSH 2.440 12/12/2021   Lab Results  Component Value Date   WBC 6.1 12/12/2021   HGB 14.0 12/12/2021   HCT 42.9 12/12/2021   MCV 81 12/12/2021   PLT 214 12/12/2021   Lab Results  Component Value Date   NA 143 05/16/2022   K 5.0 05/16/2022   CO2 24 05/16/2022   GLUCOSE 137 (H) 05/16/2022   BUN 17 05/16/2022   CREATININE 1.15 05/16/2022   BILITOT 0.3 05/16/2022   ALKPHOS 72 05/16/2022   AST 24 05/16/2022   ALT 31 05/16/2022   PROT 7.2 05/16/2022   ALBUMIN 4.3 05/16/2022   CALCIUM 9.6 05/16/2022   ANIONGAP 8 12/30/2019   EGFR 72 05/16/2022   Lab Results  Component Value Date   CHOL 121 05/16/2022   Lab Results  Component Value Date   HDL 36 (L) 05/16/2022   Lab Results  Component Value Date   LDLCALC 72 05/16/2022   Lab Results  Component Value Date   TRIG 60 05/16/2022   Lab Results  Component Value Date   CHOLHDL 3.4 05/16/2022   Lab Results  Component Value Date   HGBA1C 8.2 (A) 05/21/2022      Assessment & Plan:   Problem List Items Addressed This Visit   None Visit Diagnoses     Need for immunization against influenza    -  Primary   Relevant Orders   Flu Vaccine QUAD 23moIM (Fluarix, Fluzone & Alfiuria Quad PF) (Completed)       No orders of the defined types were placed in this  encounter.   Follow-up: No follow-ups on file.    RLindell Spar MD

## 2022-05-29 NOTE — Patient Instructions (Signed)
Please continue taking medications as prescribed.  Please bring medications to the office in the next visit.  Please check BP at once a day and if it runs above 140/90 on 3 consecutive readings, please contact us.  Please continue to follow low carb and low salt diet and ambulate as tolerated.

## 2022-05-30 ENCOUNTER — Encounter: Payer: Self-pay | Admitting: Internal Medicine

## 2022-05-30 NOTE — Assessment & Plan Note (Addendum)
BP Readings from Last 1 Encounters:  05/29/22 (!) 144/88   Uncontrolled with Losartan 100 mg daily QD and amlodipine 10 mg QD, but has not had medications today Reports taking Lasix, but no further details available - advised to bring home meds for review As current home regimen is unclear, would avoid changing the medicine for now Counseled for compliance with the medications Advised DASH diet and moderate exercise/walking, at least 150 mins/week

## 2022-05-30 NOTE — Assessment & Plan Note (Addendum)
Lab Results  Component Value Date   HGBA1C 8.2 (A) 05/21/2022   Uncontrolled On Lantus 28 U qHS and Glipizide 5 mg QD F/u with Dr Nida Advised to follow diabetic diet On statin F/u CMP and lipid panel in the next visit Diabetic eye exam: Advised to continue to follow up with Ophthalmology for diabetic eye exam 

## 2022-05-30 NOTE — Assessment & Plan Note (Signed)
On Crestor Reviewed lipid profile - needs to be compliant with Crestor 

## 2022-06-11 ENCOUNTER — Other Ambulatory Visit: Payer: Self-pay | Admitting: Internal Medicine

## 2022-06-11 DIAGNOSIS — N400 Enlarged prostate without lower urinary tract symptoms: Secondary | ICD-10-CM

## 2022-07-06 ENCOUNTER — Telehealth: Payer: Self-pay | Admitting: Internal Medicine

## 2022-07-06 ENCOUNTER — Other Ambulatory Visit: Payer: Self-pay

## 2022-07-06 DIAGNOSIS — N400 Enlarged prostate without lower urinary tract symptoms: Secondary | ICD-10-CM

## 2022-07-06 DIAGNOSIS — I10 Essential (primary) hypertension: Secondary | ICD-10-CM

## 2022-07-06 DIAGNOSIS — N529 Male erectile dysfunction, unspecified: Secondary | ICD-10-CM

## 2022-07-06 MED ORDER — LOSARTAN POTASSIUM 100 MG PO TABS: 100.0000 mg | ORAL_TABLET | Freq: Every day | ORAL | 1 refills | Status: DC

## 2022-07-06 MED ORDER — SILDENAFIL CITRATE 100 MG PO TABS: 100.0000 mg | ORAL_TABLET | Freq: Every day | ORAL | 2 refills | Status: DC | PRN

## 2022-07-06 MED ORDER — FINASTERIDE 5 MG PO TABS: 5.0000 mg | ORAL_TABLET | Freq: Every day | ORAL | 1 refills | Status: DC

## 2022-07-06 NOTE — Telephone Encounter (Signed)
Patient need med refill finasteride (PROSCAR) 5 MG tablet [[id number]]  Asking why does he have to call every month for a refill when he calls his pharmacy. Please call patient and explain.  losartan (COZAAR) 100 MG tablet [[id number]]    sildenafil (VIAGRA) 100 MG tablet [[id number]]  (pt request an increase number of pills to last longer)     Pharmacy: [facility]

## 2022-07-06 NOTE — Telephone Encounter (Signed)
Refilled meds

## 2022-07-15 ENCOUNTER — Other Ambulatory Visit: Payer: Self-pay | Admitting: Internal Medicine

## 2022-07-15 DIAGNOSIS — N529 Male erectile dysfunction, unspecified: Secondary | ICD-10-CM

## 2022-07-15 MED ORDER — SILDENAFIL CITRATE 100 MG PO TABS: 100.0000 mg | ORAL_TABLET | Freq: Every day | ORAL | 2 refills | Status: DC | PRN

## 2022-07-16 NOTE — Telephone Encounter (Signed)
Pt aware.

## 2022-08-10 ENCOUNTER — Other Ambulatory Visit: Payer: Self-pay | Admitting: "Endocrinology

## 2022-08-10 ENCOUNTER — Other Ambulatory Visit: Payer: Self-pay | Admitting: Internal Medicine

## 2022-08-22 ENCOUNTER — Ambulatory Visit (INDEPENDENT_AMBULATORY_CARE_PROVIDER_SITE_OTHER): Payer: 59 | Admitting: Podiatry

## 2022-08-22 VITALS — BP 154/96

## 2022-08-22 DIAGNOSIS — E1142 Type 2 diabetes mellitus with diabetic polyneuropathy: Secondary | ICD-10-CM

## 2022-08-22 DIAGNOSIS — I739 Peripheral vascular disease, unspecified: Secondary | ICD-10-CM

## 2022-08-22 DIAGNOSIS — M79675 Pain in left toe(s): Secondary | ICD-10-CM | POA: Diagnosis not present

## 2022-08-22 DIAGNOSIS — Z794 Long term (current) use of insulin: Secondary | ICD-10-CM

## 2022-08-22 DIAGNOSIS — M79674 Pain in right toe(s): Secondary | ICD-10-CM | POA: Diagnosis not present

## 2022-08-22 DIAGNOSIS — M2011 Hallux valgus (acquired), right foot: Secondary | ICD-10-CM

## 2022-08-22 DIAGNOSIS — B351 Tinea unguium: Secondary | ICD-10-CM

## 2022-08-22 DIAGNOSIS — M2012 Hallux valgus (acquired), left foot: Secondary | ICD-10-CM

## 2022-08-22 DIAGNOSIS — E119 Type 2 diabetes mellitus without complications: Secondary | ICD-10-CM

## 2022-08-22 DIAGNOSIS — L84 Corns and callosities: Secondary | ICD-10-CM

## 2022-08-22 NOTE — Progress Notes (Addendum)
ANNUAL DIABETIC FOOT EXAM  Subjective: Chad Kirk presents today for annual diabetic foot examination, at risk foot care. Pt has h/o NIDDM with PAD, and preulcerative lesion(s) left foot and painful mycotic toenails that limit ambulation. Painful toenails interfere with ambulation. Aggravating factors include wearing enclosed shoe gear. Pain is relieved with periodic professional debridement. Painful porokeratotic lesions are aggravated when weightbearing with and without shoegear. Pain is relieved with periodic professional debridement..  Chief Complaint  Patient presents with   Nail Problem    DFC BS-102 A1C-7.0 PCP-[facility] PCP VST-04/2022   Patient confirms h/o diabetes.  Patient relates {Numbers; 0-100:15068} year h/o diabetes.  Patient has h/o foot ulcer of {jgPodToeLocator:23637}, which healed via help of ***.  Patient has been diagnosed with neuropathy.  Risk factors: diabetes, history of foot/leg ulcer, PAD, HTN, hyperlipidemia, h/o tobacco use in remission.  Chad Spar, MD is patient's PCP.   Past Medical History:  Diagnosis Date   Diabetes mellitus without complication ([place])    DIAGNOSED AGE 25   Diabetic ulcer of left great toe ([place]) 01/25/2021   Healed diabetic foot ulcer 02/22/2021   Hypertension    Patient Active Problem List   Diagnosis Date Noted   Vitamin D deficiency 05/21/2022   Skin tag of perineum in male 01/09/2022   Cough 01/09/2022   Encounter for general adult medical examination with abnormal findings 12/12/2021   Essential hypertension, benign 12/12/2021   Erectile dysfunction 10/28/2020   PVD (peripheral vascular disease) ([place]) 06/30/2020   Pseudophakia of right eye 05/24/2020   Duodenal ulcer 03/15/2020   GERD (gastroesophageal reflux disease) 02/29/2020   Traction retinal detachment involving macula of left eye 11/16/2019   Diabetic macular edema ([place]) 08/10/2019   Left eye affected by proliferative diabetic retinopathy with traction  retinal detachment involving macula, associated with type 2 diabetes mellitus ([place]) 08/10/2019   Retinal edema 07/03/2019   Nuclear sclerotic cataract of left eye 07/03/2019   Type 2 diabetes mellitus with ophthalmic complication ([place]) 70/26/3785   BPH (benign prostatic hyperplasia) 10/19/2018   Alcohol dependence ([place]) 10/19/2018   Meralgia paresthetica of right side 10/18/2018   Mixed hyperlipidemia 10/28/2015   Past Surgical History:  Procedure Laterality Date   ESOPHAGEAL DILATION N/A 12/07/2019   Procedure: ESOPHAGEAL OR PYLORIC DILATION;  Surgeon: Danie Binder, MD;  [facility];  Service: Endoscopy;  Laterality: N/A;   ESOPHAGOGASTRODUODENOSCOPY N/A 12/07/2019   erosive gastritis, many non-bleeding cratered and superficial duodenal ulcers without stigmata of bleeding in duodenal bulb. Empiric dilation due to possible occult esophageal web. Negative H.pylori.    EYE SURGERY  03/02/2020   diabetic retinopathy, lens placement, cataract removal, right eye   HERNIA REPAIR Left 1990   Current Outpatient Medications on File Prior to Visit  Medication Sig Dispense Refill   amLODipine (NORVASC) 10 MG tablet Take 1 tablet (10 mg total) by mouth daily. 90 tablet 3   atropine 1 % ophthalmic solution SMARTSIG:1 Drop(s) Left Eye Morning-Night     benzonatate (TESSALON) 100 MG capsule Take 1 capsule (100 mg total) by mouth 2 (two) times daily as needed for cough. 20 capsule 0   blood glucose meter kit and supplies KIT 1 each by Does not apply route 4 (four) times daily. Dispense based on patient and insurance preference. Use up to four times daily as directed. 1 each 0   Blood Glucose Monitoring Suppl (ACCU-CHEK GUIDE ME) w/Device KIT 1 Piece by Does not apply route as directed. 1 kit 0  Carboxymethylcellulose Sodium (LUBRICANT EYE DROPS OP) Apply 1 Container to eye as needed (dry eye). Left eye     Cholecalciferol (VITAMIN D3) 125 MCG (5000 UT) CAPS Take 1 capsule (5,000 Units total)  by mouth daily. 90 capsule 1   Continuous Blood Gluc Receiver (FREESTYLE LIBRE 2 READER) DEVI As directed 1 each 0   Continuous Blood Gluc Sensor (FREESTYLE LIBRE 2 SENSOR) MISC Use to monitor glucose continuously 2 each 2   dorzolamide (TRUSOPT) 2 % ophthalmic solution Place 1 drop into the left eye 3 (three) times daily.     finasteride (PROSCAR) 5 MG tablet Take 1 tablet (5 mg total) by mouth daily. 90 tablet 1   FUROSEMIDE PO Take by mouth daily.     glipiZIDE (GLUCOTROL XL) 5 MG 24 hr tablet Take 1 tablet by mouth once daily with breakfast 90 tablet 0   glucose blood (ONETOUCH VERIO) test strip USE 1 STRIP TO CHECK GLUCOSE UP TO 2 TIMES DAILY 100 each 2   Insulin Pen Needle (B-D ULTRAFINE III SHORT PEN) 31G X 8 MM MISC 1 each by Does not apply route as directed. 100 each 3   Lancets (ONETOUCH DELICA PLUS [id number]) MISC Apply topically.     LANTUS SOLOSTAR 100 UNIT/ML Solostar Pen INJECT 28 UNITS SUBCUTANEOUSLY AT BEDTIME 30 mL 0   losartan (COZAAR) 100 MG tablet Take 1 tablet (100 mg total) by mouth daily. 90 tablet 1   Multiple Vitamins-Minerals (QC DAILY MULTIVIT/MULTIMINERAL PO) Take 1 tablet by mouth daily.     prednisoLONE acetate (PRED FORTE) 1 % ophthalmic suspension 1 drop 4 (four) times daily.     Probiotic Product (PROBIOTIC PO) Take 1 tablet by mouth daily.     rosuvastatin (CRESTOR) 10 MG tablet Take 1 tablet (10 mg total) by mouth daily. 90 tablet 1   senna-docusate (SENOKOT-S) 8.6-50 MG tablet Take 1 tablet by mouth as needed.      sildenafil (VIAGRA) 100 MG tablet Take 1 tablet (100 mg total) by mouth daily as needed for erectile dysfunction. 30 tablet 2   tamsulosin (FLOMAX) 0.4 MG CAPS capsule TAKE 1 CAPSULE BY MOUTH  DAILY 90 capsule 3   timolol (TIMOPTIC) 0.5 % ophthalmic solution Place 1 drop into both eyes 3 times daily.     UNABLE TO FIND Take 5 mLs by mouth every 4 (four) hours as needed. Med Name: Apothecary Cough Syrup Take 5 mL by mouth every 4 to 6 hours as  needed for cough 120 mL 0   No current facility-administered medications on file prior to visit.    No Known Allergies Social History   Occupational History   Not on file  Tobacco Use   Smoking status: Former    Packs/day: 1.00    Years: 5.00    Total pack years: 5.00    Types: Cigarettes    Quit date: 08/20/1988    Years since quitting: 34.0   Smokeless tobacco: Never  Vaping Use   Vaping Use: Never used  Substance and Sexual Activity   Alcohol use: Not Currently    Comment: denied 03/15/20   Drug use: No   Sexual activity: Not on file   Family History  Problem Relation Age of Onset   Stroke Mother    Hypertension Mother    Diabetes Mother    Hypertension Father    Diabetes Father    Colon cancer Neg Hx    Colon polyps Neg Hx    Immunization History  Administered Date(s)  Administered   Fluad Quad(high Dose 65+) 06/27/2021   Influenza,inj,Quad PF,6+ Mos 05/29/2022   PFIZER(Purple Top)SARS-COV-2 Vaccination 10/17/2019, 11/07/2019   Pneumococcal Conjugate-13 08/08/2021   Pneumococcal Polysaccharide-23 06/30/2020   Tdap 02/03/2012   Zoster Recombinat (Shingrix) 10/05/2021, 11/23/2021     Review of Systems: Negative except as noted in the HPI.   Objective: There were no vitals filed for this visit.  Orell Hurtado is a pleasant 62 y.o. male in NAD. AAO X 3.  Vascular Examination: {jgvascular:23595}  Dermatological Examination: {jgderm:23598}  Neurological Examination: {jgneuro:23601::"Protective sensation intact 5/5 intact bilaterally with 10g monofilament b/l.","Vibratory sensation intact b/l.","Proprioception intact bilaterally."}  Musculoskeletal Examination: {jgmsk:23600}  Footwear Assessment: Does the patient wear appropriate shoes? {Yes,No}. Does the patient need inserts/orthotics? {Yes,No}.  ADA Risk Categorization: High Risk  Patient has one or more of the following: Loss of protective sensation Absent pedal pulses Severe Foot  deformity History of foot ulcer  Assessment: 1. Pain due to onychomycosis of toenails of both feet   2. Callus   3. Pre-ulcerative calluses   4. Type 2 diabetes mellitus with diabetic polyneuropathy, with long-term current use of insulin ([place])      Plan: No orders of the defined types were placed in this encounter.   No orders of the defined types were placed in this encounter.   None  -Patient was evaluated and treated. All patient's and/or POA's questions/concerns answered on today's visit. -Diabetic foot examination performed today. -Discussed and educated patient on diabetic foot care, especially with  regards to the vascular, neurological and musculoskeletal systems. -Patient to continue soft, supportive shoe gear daily. -Toenails 1-5 b/l were debrided in length and girth with sterile nail nippers and dremel without iatrogenic bleeding.  -Patient/POA to call should there be question/concern in the interim. Return in about 3 months (around 11/21/2022).  Marzetta Board, DPM

## 2022-08-23 ENCOUNTER — Encounter: Payer: Self-pay | Admitting: Podiatry

## 2022-08-30 ENCOUNTER — Ambulatory Visit (INDEPENDENT_AMBULATORY_CARE_PROVIDER_SITE_OTHER): Payer: 59 | Admitting: Internal Medicine

## 2022-08-30 ENCOUNTER — Encounter: Payer: Self-pay | Admitting: Internal Medicine

## 2022-08-30 VITALS — BP 134/78 | HR 80 | Ht 70.0 in | Wt 183.6 lb

## 2022-08-30 DIAGNOSIS — I10 Essential (primary) hypertension: Secondary | ICD-10-CM

## 2022-08-30 DIAGNOSIS — Z23 Encounter for immunization: Secondary | ICD-10-CM | POA: Diagnosis not present

## 2022-08-30 DIAGNOSIS — Z794 Long term (current) use of insulin: Secondary | ICD-10-CM

## 2022-08-30 DIAGNOSIS — E113592 Type 2 diabetes mellitus with proliferative diabetic retinopathy without macular edema, left eye: Secondary | ICD-10-CM | POA: Diagnosis not present

## 2022-08-30 DIAGNOSIS — E782 Mixed hyperlipidemia: Secondary | ICD-10-CM | POA: Diagnosis not present

## 2022-08-30 DIAGNOSIS — R059 Cough, unspecified: Secondary | ICD-10-CM

## 2022-08-30 DIAGNOSIS — F1021 Alcohol dependence, in remission: Secondary | ICD-10-CM

## 2022-08-30 MED ORDER — BENZONATATE 200 MG PO CAPS: 200.0000 mg | ORAL_CAPSULE | Freq: Two times a day (BID) | ORAL | 0 refills | Status: DC | PRN

## 2022-08-30 NOTE — Progress Notes (Signed)
Established Patient Office Visit  Subjective:  Patient ID: Chad Kirk, male    DOB: 1960-11-17  Age: 62 y.o. MRN: [id number]  CC:  Chief Complaint  Patient presents with   Hypertension    Patient is here for a three month follow up on his hypertension and diabetes    HPI Chad Kirk is a 62 y.o. male with past medical history of diabetes mellitus type 2, PVD, hypertension, hyperlipidemia, diabetic macular edema, BPH and alcohol abuse who presents for f/u of his chronic medical conditions.  HTN: His BP was  elevated today initially, but improved later. His BP is around 120s/80s at home.  His BP has been elevated during recent office visit. Takes medications regularly - Losartan 100 mg QD and Amlodipine 10 mg daily. Patient denies headache, dizziness, chest pain, dyspnea or palpitations.  Type II DM: He follows up with Dr. [name] for diabetes management.  He takes Lantus 28 units nightly now.  He also takes glipizide 5 mg QD. He follows up with ophthalmologist for diabetic macular edema and his vision has improved now. Denies any polyuria, polyphagia or fatigue.  He reports cough for last 4 weeks. Denies any nasal congestion, sinus pressure or dyspnea. He has soreness in throat in the morning.   Past Medical History:  Diagnosis Date   Diabetes mellitus without complication (HCC)    DIAGNOSED AGE 61   Diabetic ulcer of left great toe (HCC) 01/25/2021   Healed diabetic foot ulcer 02/22/2021   Hypertension     Past Surgical History:  Procedure Laterality Date   ESOPHAGEAL DILATION N/A 12/07/2019   Procedure: ESOPHAGEAL OR PYLORIC DILATION;  Surgeon: West Bali, MD;  Location: [facility];  Service: Endoscopy;  Laterality: N/A;   ESOPHAGOGASTRODUODENOSCOPY N/A 12/07/2019   erosive gastritis, many non-bleeding cratered and superficial duodenal ulcers without stigmata of bleeding in duodenal bulb. Empiric dilation due to possible occult esophageal web. Negative H.pylori.    EYE  SURGERY  03/02/2020   diabetic retinopathy, lens placement, cataract removal, right eye   HERNIA REPAIR Left 1990    Family History  Problem Relation Age of Onset   Stroke Mother    Hypertension Mother    Diabetes Mother    Hypertension Father    Diabetes Father    Colon cancer Neg Hx    Colon polyps Neg Hx     Social History   Socioeconomic History   Marital status: Married    Spouse name: Not on file   Number of children: Not on file   Years of education: Not on file   Highest education level: Not on file  Occupational History   Not on file  Tobacco Use   Smoking status: Former    Packs/day: 1.00    Years: 5.00    Total pack years: 5.00    Types: Cigarettes    Quit date: 08/20/1988    Years since quitting: 34.0   Smokeless tobacco: Never  Vaping Use   Vaping Use: Never used  Substance and Sexual Activity   Alcohol use: Not Currently    Comment: denied 03/15/20   Drug use: No   Sexual activity: Not on file  Other Topics Concern   Not on file  Social History Narrative   Not on file   Social Determinants of Health   Financial Resource Strain: Not on file  Food Insecurity: Not on file  Transportation Needs: Not on file  Physical Activity: Not on file  Stress: Not on  file  Social Connections: Not on file  Intimate Partner Violence: Not on file    Outpatient Medications Prior to Visit  Medication Sig Dispense Refill   amLODipine (NORVASC) 10 MG tablet Take 1 tablet (10 mg total) by mouth daily. 90 tablet 3   atropine 1 % ophthalmic solution SMARTSIG:1 Drop(s) Left Eye Morning-Night     blood glucose meter kit and supplies KIT 1 each by Does not apply route 4 (four) times daily. Dispense based on patient and insurance preference. Use up to four times daily as directed. 1 each 0   Blood Glucose Monitoring Suppl (ACCU-CHEK GUIDE ME) w/Device KIT 1 Piece by Does not apply route as directed. 1 kit 0   Carboxymethylcellulose Sodium (LUBRICANT EYE DROPS OP) Apply 1  Container to eye as needed (dry eye). Left eye     Cholecalciferol (VITAMIN D3) 125 MCG (5000 UT) CAPS Take 1 capsule (5,000 Units total) by mouth daily. 90 capsule 1   Continuous Blood Gluc Receiver (FREESTYLE LIBRE 2 READER) DEVI As directed 1 each 0   Continuous Blood Gluc Sensor (FREESTYLE LIBRE 2 SENSOR) MISC Use to monitor glucose continuously 2 each 2   dorzolamide (TRUSOPT) 2 % ophthalmic solution Place 1 drop into the left eye 3 (three) times daily.     finasteride (PROSCAR) 5 MG tablet Take 1 tablet (5 mg total) by mouth daily. 90 tablet 1   FUROSEMIDE PO Take by mouth daily.     glipiZIDE (GLUCOTROL XL) 5 MG 24 hr tablet Take 1 tablet by mouth once daily with breakfast 90 tablet 0   glucose blood (ONETOUCH VERIO) test strip USE 1 STRIP TO CHECK GLUCOSE UP TO 2 TIMES DAILY 100 each 2   Insulin Pen Needle (B-D ULTRAFINE III SHORT PEN) 31G X 8 MM MISC 1 each by Does not apply route as directed. 100 each 3   Lancets (ONETOUCH DELICA PLUS [id number]) MISC Apply topically.     LANTUS SOLOSTAR 100 UNIT/ML Solostar Pen INJECT 28 UNITS SUBCUTANEOUSLY AT BEDTIME 30 mL 0   losartan (COZAAR) 100 MG tablet Take 1 tablet (100 mg total) by mouth daily. 90 tablet 1   Multiple Vitamins-Minerals (QC DAILY MULTIVIT/MULTIMINERAL PO) Take 1 tablet by mouth daily.     prednisoLONE acetate (PRED FORTE) 1 % ophthalmic suspension 1 drop 4 (four) times daily.     Probiotic Product (PROBIOTIC PO) Take 1 tablet by mouth daily.     rosuvastatin (CRESTOR) 10 MG tablet Take 1 tablet (10 mg total) by mouth daily. 90 tablet 1   senna-docusate (SENOKOT-S) 8.6-50 MG tablet Take 1 tablet by mouth as needed.      sildenafil (VIAGRA) 100 MG tablet Take 1 tablet (100 mg total) by mouth daily as needed for erectile dysfunction. 30 tablet 2   tamsulosin (FLOMAX) 0.4 MG CAPS capsule TAKE 1 CAPSULE BY MOUTH  DAILY 90 capsule 3   timolol (TIMOPTIC) 0.5 % ophthalmic solution Place 1 drop into both eyes 3 times daily.     UNABLE  TO FIND Take 5 mLs by mouth every 4 (four) hours as needed. Med Name: Apothecary Cough Syrup Take 5 mL by mouth every 4 to 6 hours as needed for cough 120 mL 0   benzonatate (TESSALON) 100 MG capsule Take 1 capsule (100 mg total) by mouth 2 (two) times daily as needed for cough. 20 capsule 0   No facility-administered medications prior to visit.    No Known Allergies  ROS Review of Systems  Constitutional:  Negative for chills and fever.  HENT:  Negative for congestion and sore throat.   Eyes:  Positive for visual disturbance. Negative for pain and discharge.  Respiratory:  Negative for cough and shortness of breath.   Cardiovascular:  Negative for chest pain and palpitations.  Gastrointestinal:  Negative for constipation, diarrhea, nausea and vomiting.  Endocrine: Negative for polydipsia and polyuria.  Genitourinary:  Negative for dysuria and hematuria.  Musculoskeletal:  Negative for neck pain and neck stiffness.  Skin:  Negative for rash.  Neurological:  Negative for dizziness, weakness, numbness and headaches.  Psychiatric/Behavioral:  Negative for agitation and behavioral problems.       Objective:    Physical Exam Vitals reviewed.  Constitutional:      General: He is not in acute distress.    Appearance: He is not diaphoretic.  HENT:     Head: Normocephalic and atraumatic.     Nose: Nose normal.     Mouth/Throat:     Mouth: Mucous membranes are moist.  Eyes:     General: No scleral icterus.    Extraocular Movements: Extraocular movements intact.  Cardiovascular:     Rate and Rhythm: Normal rate and regular rhythm.     Heart sounds: Normal heart sounds. No murmur heard. Pulmonary:     Breath sounds: Normal breath sounds. No wheezing or rales.  Musculoskeletal:     Cervical back: Neck supple. No tenderness.     Right lower leg: No edema.     Left lower leg: No edema.  Skin:    General: Skin is warm.     Findings: No rash.  Neurological:     General: No focal  deficit present.     Mental Status: He is alert and oriented to person, place, and time.     Sensory: No sensory deficit.     Motor: No weakness.  Psychiatric:        Mood and Affect: Mood normal.        Behavior: Behavior normal.     BP 134/78 (BP Location: Left Arm, Cuff Size: Normal)   Pulse 80   Ht 5\' 10"  (1.778 m)   Wt 183 lb 9.6 oz (83.3 kg)   SpO2 96%   BMI 26.34 kg/m  Wt Readings from Last 3 Encounters:  08/30/22 183 lb 9.6 oz (83.3 kg)  05/29/22 182 lb 6.4 oz (82.7 kg)  05/21/22 180 lb 3.2 oz (81.7 kg)    Lab Results  Component Value Date   TSH 2.440 12/12/2021   Lab Results  Component Value Date   WBC 6.1 12/12/2021   HGB 14.0 12/12/2021   HCT 42.9 12/12/2021   MCV 81 12/12/2021   PLT 214 12/12/2021   Lab Results  Component Value Date   NA 143 05/16/2022   K 5.0 05/16/2022   CO2 24 05/16/2022   GLUCOSE 137 (H) 05/16/2022   BUN 17 05/16/2022   CREATININE 1.15 05/16/2022   BILITOT 0.3 05/16/2022   ALKPHOS 72 05/16/2022   AST 24 05/16/2022   ALT 31 05/16/2022   PROT 7.2 05/16/2022   ALBUMIN 4.3 05/16/2022   CALCIUM 9.6 05/16/2022   ANIONGAP 8 12/30/2019   EGFR 72 05/16/2022   Lab Results  Component Value Date   CHOL 121 05/16/2022   Lab Results  Component Value Date   HDL 36 (L) 05/16/2022   Lab Results  Component Value Date   LDLCALC 72 05/16/2022   Lab Results  Component Value Date   TRIG  60 05/16/2022   Lab Results  Component Value Date   CHOLHDL 3.4 05/16/2022   Lab Results  Component Value Date   HGBA1C 8.2 (A) 05/21/2022      Assessment & Plan:   Problem List Items Addressed This Visit       Cardiovascular and Mediastinum   Essential hypertension, benign - Primary    BP Readings from Last 1 Encounters:  08/30/22 134/78  Uncontrolled with Losartan 100 mg daily QD and amlodipine 10 mg QD Lasix PRN for leg swelling Counseled for compliance with the medications Advised DASH diet and moderate exercise/walking, at least  150 mins/week        Endocrine   Type 2 diabetes mellitus with ophthalmic complication (HCC)    Lab Results  Component Value Date   HGBA1C 8.2 (A) 05/21/2022  Uncontrolled On Lantus 28 U qHS and Glipizide 5 mg QD F/u with Dr Dorris Fetch Advised to follow diabetic diet On statin F/u CMP and lipid panel in the next visit Diabetic eye exam: Advised to continue to follow up with Ophthalmology for diabetic eye exam        Other   Mixed hyperlipidemia    On Crestor Reviewed lipid profile - needs to be compliant with Crestor      Alcohol dependence ([place])    In remission      Cough    Likely due to throat irritation from dry air Advised to use humidifier at nighttime Tessalon PRN for cough      Relevant Medications   benzonatate (TESSALON) 200 MG capsule   Other Visit Diagnoses     Need for tetanus booster       Relevant Orders   Tdap vaccine greater than or equal to 7yo IM (Completed)      Meds ordered this encounter  Medications   benzonatate (TESSALON) 200 MG capsule    Sig: Take 1 capsule (200 mg total) by mouth 2 (two) times daily as needed for cough.    Dispense:  30 capsule    Refill:  0    Follow-up: Return in about 4 months (around 12/29/2022) for Annual physical.    [name], MD

## 2022-08-30 NOTE — Patient Instructions (Signed)
Please continue taking medications as prescribed.  Please continue to follow low carb diet and perform moderate exercise/walking at least 150 mins/week. 

## 2022-08-30 NOTE — Assessment & Plan Note (Signed)
Lab Results  Component Value Date   HGBA1C 8.2 (A) 05/21/2022   Uncontrolled On Lantus 28 U qHS and Glipizide 5 mg QD F/u with Dr Dorris Fetch Advised to follow diabetic diet On statin F/u CMP and lipid panel in the next visit Diabetic eye exam: Advised to continue to follow up with Ophthalmology for diabetic eye exam

## 2022-08-30 NOTE — Assessment & Plan Note (Signed)
On Crestor Reviewed lipid profile - needs to be compliant with Crestor

## 2022-08-30 NOTE — Assessment & Plan Note (Signed)
BP Readings from Last 1 Encounters:  08/30/22 134/78   Uncontrolled with Losartan 100 mg daily QD and amlodipine 10 mg QD Lasix PRN for leg swelling Counseled for compliance with the medications Advised DASH diet and moderate exercise/walking, at least 150 mins/week

## 2022-08-30 NOTE — Assessment & Plan Note (Signed)
Likely due to throat irritation from dry air Advised to use humidifier at nighttime Tessalon PRN for cough

## 2022-08-30 NOTE — Assessment & Plan Note (Signed)
In remission.

## 2022-09-04 ENCOUNTER — Other Ambulatory Visit: Payer: Self-pay | Admitting: "Endocrinology

## 2022-09-04 DIAGNOSIS — E782 Mixed hyperlipidemia: Secondary | ICD-10-CM

## 2022-09-05 ENCOUNTER — Other Ambulatory Visit: Payer: 59

## 2022-09-06 LAB — PSA: Prostate Specific Ag, Serum: 9.4 ng/mL — ABNORMAL HIGH (ref 0.0–4.0)

## 2022-09-07 ENCOUNTER — Other Ambulatory Visit: Payer: 59

## 2022-09-10 NOTE — Progress Notes (Signed)
History of Present Illness: HEre for followup of elevated PSA.  7.18.2023: TRUS/Bx. PSA 10.1, prostate volume 35 mL, PSAD 0.28. All 12 cores benign.  He is on finasteride  1.23.2024: PSA 9.4.  He continues on tamsulosin and finasteride.  IPSS 7, quality-of-life score 1.  Past Medical History:  Diagnosis Date   Diabetes mellitus without complication ([place])    DIAGNOSED AGE 62   Diabetic ulcer of left great toe ([place]) 01/25/2021   Healed diabetic foot ulcer 02/22/2021   Hypertension     Past Surgical History:  Procedure Laterality Date   ESOPHAGEAL DILATION N/A 12/07/2019   Procedure: ESOPHAGEAL OR PYLORIC DILATION;  Surgeon: Danie Binder, MD;  Location: [facility];  Service: Endoscopy;  Laterality: N/A;   ESOPHAGOGASTRODUODENOSCOPY N/A 12/07/2019   erosive gastritis, many non-bleeding cratered and superficial duodenal ulcers without stigmata of bleeding in duodenal bulb. Empiric dilation due to possible occult esophageal web. Negative H.pylori.    EYE SURGERY  03/02/2020   diabetic retinopathy, lens placement, cataract removal, right eye   HERNIA REPAIR Left 1990    Home Medications:  Allergies as of 09/11/2022   No Known Allergies      Medication List        Accurate as of September 10, 2022 11:59 AM. If you have any questions, ask your nurse or doctor.          Accu-Chek Guide Me w/Device Kit 1 Piece by Does not apply route as directed.   amLODipine 10 MG tablet Commonly known as: NORVASC Take 1 tablet (10 mg total) by mouth daily.   atropine 1 % ophthalmic solution SMARTSIG:1 Drop(s) Left Eye Morning-Night   B-D ULTRAFINE III SHORT PEN 31G X 8 MM Misc Generic drug: Insulin Pen Needle 1 each by Does not apply route as directed.   benzonatate 200 MG capsule Commonly known as: TESSALON Take 1 capsule (200 mg total) by mouth 2 (two) times daily as needed for cough.   blood glucose meter kit and supplies Kit 1 each by Does not apply route 4 (four) times daily.  Dispense based on patient and insurance preference. Use up to four times daily as directed.   dorzolamide 2 % ophthalmic solution Commonly known as: TRUSOPT Place 1 drop into the left eye 3 (three) times daily.   finasteride 5 MG tablet Commonly known as: PROSCAR Take 1 tablet (5 mg total) by mouth daily.   FreeStyle Libre 2 Reader Kerrin Mo As directed   [employer] 2 Sensor Misc Use to monitor glucose continuously   FUROSEMIDE PO Take by mouth daily.   glipiZIDE 5 MG 24 hr tablet Commonly known as: GLUCOTROL XL Take 1 tablet by mouth once daily with breakfast   Lantus SoloStar 100 UNIT/ML Solostar Pen Generic drug: insulin glargine INJECT 28 UNITS SUBCUTANEOUSLY AT BEDTIME   losartan 100 MG tablet Commonly known as: COZAAR Take 1 tablet (100 mg total) by mouth daily.   LUBRICANT EYE DROPS OP Apply 1 Container to eye as needed (dry eye). Left eye   OneTouch Delica Plus [id number] Misc Apply topically.   OneTouch Verio test strip Generic drug: glucose blood USE 1 STRIP TO CHECK GLUCOSE UP TO 2 TIMES DAILY   prednisoLONE acetate 1 % ophthalmic suspension Commonly known as: PRED FORTE 1 drop 4 (four) times daily.   PROBIOTIC PO Take 1 tablet by mouth daily.   QC DAILY MULTIVIT/MULTIMINERAL PO Take 1 tablet by mouth daily.   rosuvastatin 10 MG tablet Commonly known as: CRESTOR  Take 1 tablet by mouth once daily   senna-docusate 8.6-50 MG tablet Commonly known as: Senokot-S Take 1 tablet by mouth as needed.   sildenafil 100 MG tablet Commonly known as: Viagra Take 1 tablet (100 mg total) by mouth daily as needed for erectile dysfunction.   tamsulosin 0.4 MG Caps capsule Commonly known as: FLOMAX TAKE 1 CAPSULE BY MOUTH  DAILY   timolol 0.5 % ophthalmic solution Commonly known as: TIMOPTIC Place 1 drop into both eyes 3 times daily.   UNABLE TO FIND Take 5 mLs by mouth every 4 (four) hours as needed. Med Name: Apothecary Cough Syrup Take 5 mL by mouth  every 4 to 6 hours as needed for cough   Vitamin D3 125 MCG (5000 UT) Caps Take 1 capsule (5,000 Units total) by mouth daily.        Allergies: No Known Allergies  Family History  Problem Relation Age of Onset   Stroke Mother    Hypertension Mother    Diabetes Mother    Hypertension Father    Diabetes Father    Colon cancer Neg Hx    Colon polyps Neg Hx     Social History:  reports that he quit smoking about 34 years ago. His smoking use included cigarettes. He has a 5.00 pack-year smoking history. He has never used smokeless tobacco. He reports that he does not currently use alcohol. He reports that he does not use drugs.  ROS: A complete review of systems was performed.  All systems are negative except for pertinent findings as noted.  Physical Exam:  Vital signs in last 24 hours: There were no vitals taken for this visit. Constitutional:  Alert and oriented, No acute distress Cardiovascular: Regular rate  Respiratory: Normal respiratory effort Neurologic: Grossly intact, no focal deficits Psychiatric: Normal mood and affect  I have reviewed prior pt notes  I have reviewed urinalysis results  I have independently reviewed prior imaging-prostate volume 35 mL on ultrasound  I have reviewed prior PSA and pathology results  I reviewed IPSS sheet   Impression/Assessment:  1.  BPH, symptoms acceptable on dual medical therapy  2.  Elevated PSA.  Patient on finasteride.  Negative biopsy in 2023.  Plan:  1.  I provided him with results of PSA as well as biopsy  2.  I will see back in 6 months following PSA  3.  Continue dual medical therapy

## 2022-09-11 ENCOUNTER — Ambulatory Visit (INDEPENDENT_AMBULATORY_CARE_PROVIDER_SITE_OTHER): Payer: 59 | Admitting: Urology

## 2022-09-11 ENCOUNTER — Encounter: Payer: Self-pay | Admitting: Urology

## 2022-09-11 VITALS — BP 155/96 | HR 86

## 2022-09-11 DIAGNOSIS — N401 Enlarged prostate with lower urinary tract symptoms: Secondary | ICD-10-CM | POA: Diagnosis not present

## 2022-09-11 DIAGNOSIS — N400 Enlarged prostate without lower urinary tract symptoms: Secondary | ICD-10-CM

## 2022-09-11 DIAGNOSIS — R972 Elevated prostate specific antigen [PSA]: Secondary | ICD-10-CM | POA: Diagnosis not present

## 2022-09-11 LAB — URINALYSIS, ROUTINE W REFLEX MICROSCOPIC
Bilirubin, UA: NEGATIVE
Ketones, UA: NEGATIVE
Leukocytes,UA: NEGATIVE
Nitrite, UA: NEGATIVE
RBC, UA: NEGATIVE
Specific Gravity, UA: 1.02 (ref 1.005–1.030)
Urobilinogen, Ur: 1 mg/dL (ref 0.2–1.0)
pH, UA: 6.5 (ref 5.0–7.5)

## 2022-09-11 LAB — MICROSCOPIC EXAMINATION: Bacteria, UA: NONE SEEN

## 2022-09-11 MED ORDER — TAMSULOSIN HCL 0.4 MG PO CAPS: 0.4000 mg | ORAL_CAPSULE | Freq: Every day | ORAL | 3 refills | Status: DC

## 2022-09-21 ENCOUNTER — Encounter: Payer: Self-pay | Admitting: "Endocrinology

## 2022-09-21 ENCOUNTER — Ambulatory Visit (INDEPENDENT_AMBULATORY_CARE_PROVIDER_SITE_OTHER): Payer: 59 | Admitting: "Endocrinology

## 2022-09-21 VITALS — BP 126/80 | HR 76 | Ht 70.0 in | Wt 180.4 lb

## 2022-09-21 DIAGNOSIS — E113592 Type 2 diabetes mellitus with proliferative diabetic retinopathy without macular edema, left eye: Secondary | ICD-10-CM | POA: Diagnosis not present

## 2022-09-21 DIAGNOSIS — Z794 Long term (current) use of insulin: Secondary | ICD-10-CM | POA: Diagnosis not present

## 2022-09-21 DIAGNOSIS — E782 Mixed hyperlipidemia: Secondary | ICD-10-CM | POA: Diagnosis not present

## 2022-09-21 DIAGNOSIS — E559 Vitamin D deficiency, unspecified: Secondary | ICD-10-CM

## 2022-09-21 DIAGNOSIS — I10 Essential (primary) hypertension: Secondary | ICD-10-CM

## 2022-09-21 LAB — POCT GLYCOSYLATED HEMOGLOBIN (HGB A1C): HbA1c, POC (controlled diabetic range): 8.7 % — AB (ref 0.0–7.0)

## 2022-09-21 MED ORDER — GLIPIZIDE 5 MG PO TABS: 5.0000 mg | ORAL_TABLET | Freq: Two times a day (BID) | ORAL | 1 refills | Status: DC

## 2022-09-21 MED ORDER — LANTUS SOLOSTAR 100 UNIT/ML ~~LOC~~ SOPN: 30.0000 [IU] | PEN_INJECTOR | Freq: Every day | SUBCUTANEOUS | 1 refills | Status: DC

## 2022-09-21 NOTE — Progress Notes (Unsigned)
09/21/2022, 12:22 PM  Endocrinology follow-up note   Subjective:    Patient ID: Chad Kirk, male    DOB: 02-20-1961.  Chad Kirk is being seen in follow-up after he was seen in consultation for management of currently uncontrolled symptomatic diabetes requested by  [name], MD.   Past Medical History:  Diagnosis Date   Diabetes mellitus without complication ([place])    DIAGNOSED AGE 62   Diabetic ulcer of left great toe ([place]) 01/25/2021   Healed diabetic foot ulcer 02/22/2021   Hypertension     Past Surgical History:  Procedure Laterality Date   ESOPHAGEAL DILATION N/A 12/07/2019   Procedure: ESOPHAGEAL OR PYLORIC DILATION;  Surgeon: Danie Binder, MD;  Location: [facility];  Service: Endoscopy;  Laterality: N/A;   ESOPHAGOGASTRODUODENOSCOPY N/A 12/07/2019   erosive gastritis, many non-bleeding cratered and superficial duodenal ulcers without stigmata of bleeding in duodenal bulb. Empiric dilation due to possible occult esophageal web. Negative H.pylori.    EYE SURGERY  03/02/2020   diabetic retinopathy, lens placement, cataract removal, right eye   HERNIA REPAIR Left 1990    Social History   Socioeconomic History   Marital status: Married    Spouse name: Not on file   Number of children: Not on file   Years of education: Not on file   Highest education level: Not on file  Occupational History   Not on file  Tobacco Use   Smoking status: Former    Packs/day: 1.00    Years: 5.00    Total pack years: 5.00    Types: Cigarettes    Quit date: 08/20/1988    Years since quitting: 34.1   Smokeless tobacco: Never  Vaping Use   Vaping Use: Never used  Substance and Sexual Activity   Alcohol use: Not Currently    Comment: denied 03/15/20   Drug use: No   Sexual activity: Not on file  Other Topics Concern   Not on file  Social History Narrative   Not on file   Social  Determinants of Health   Financial Resource Strain: Not on file  Food Insecurity: Not on file  Transportation Needs: Not on file  Physical Activity: Not on file  Stress: Not on file  Social Connections: Not on file    Family History  Problem Relation Age of Onset   Stroke Mother    Hypertension Mother    Diabetes Mother    Hypertension Father    Diabetes Father    Colon cancer Neg Hx    Colon polyps Neg Hx     Outpatient Encounter Medications as of 09/21/2022  Medication Sig   glipiZIDE (GLUCOTROL) 5 MG tablet Take 1 tablet (5 mg total) by mouth 2 (two) times daily before a meal.   amLODipine (NORVASC) 10 MG tablet Take 1 tablet (10 mg total) by mouth daily.   benzonatate (TESSALON) 200 MG capsule Take 1 capsule (200 mg total) by mouth 2 (two) times daily as needed for cough.   blood glucose meter kit and supplies KIT 1 each by Does not apply route 4 (four) times  daily. Dispense based on patient and insurance preference. Use up to four times daily as directed.   Blood Glucose Monitoring Suppl (ACCU-CHEK GUIDE ME) w/Device KIT 1 Piece by Does not apply route as directed.   Carboxymethylcellulose Sodium (LUBRICANT EYE DROPS OP) Apply 1 Container to eye as needed (dry eye). Left eye   Cholecalciferol (VITAMIN D3) 125 MCG (5000 UT) CAPS Take 1 capsule (5,000 Units total) by mouth daily.   Continuous Blood Gluc Receiver (FREESTYLE LIBRE 2 READER) DEVI As directed   Continuous Blood Gluc Sensor (FREESTYLE LIBRE 2 SENSOR) MISC Use to monitor glucose continuously   dorzolamide (TRUSOPT) 2 % ophthalmic solution Place 1 drop into the left eye 3 (three) times daily.   finasteride (PROSCAR) 5 MG tablet Take 1 tablet (5 mg total) by mouth daily.   FUROSEMIDE PO Take by mouth daily.   glucose blood (ONETOUCH VERIO) test strip USE 1 STRIP TO CHECK GLUCOSE UP TO 2 TIMES DAILY   Insulin Pen Needle (B-D ULTRAFINE III SHORT PEN) 31G X 8 MM MISC 1 each by Does not apply route as directed.   Lancets  (ONETOUCH DELICA PLUS LANCET33G) MISC Apply topically.   LANTUS SOLOSTAR 100 UNIT/ML Solostar Pen Inject 30 Units into the skin at bedtime.   losartan (COZAAR) 100 MG tablet Take 1 tablet (100 mg total) by mouth daily.   Multiple Vitamins-Minerals (QC DAILY MULTIVIT/MULTIMINERAL PO) Take 1 tablet by mouth daily.   prednisoLONE acetate (PRED FORTE) 1 % ophthalmic suspension 1 drop 4 (four) times daily.   Probiotic Product (PROBIOTIC PO) Take 1 tablet by mouth daily.   rosuvastatin (CRESTOR) 10 MG tablet Take 1 tablet by mouth once daily   senna-docusate (SENOKOT-S) 8.6-50 MG tablet Take 1 tablet by mouth as needed.    sildenafil (VIAGRA) 100 MG tablet Take 1 tablet (100 mg total) by mouth daily as needed for erectile dysfunction.   tamsulosin (FLOMAX) 0.4 MG CAPS capsule Take 1 capsule (0.4 mg total) by mouth daily.   timolol (TIMOPTIC) 0.5 % ophthalmic solution Place 1 drop into both eyes 3 times daily.   UNABLE TO FIND Take 5 mLs by mouth every 4 (four) hours as needed. Med Name: Apothecary Cough Syrup Take 5 mL by mouth every 4 to 6 hours as needed for cough   [DISCONTINUED] atropine 1 % ophthalmic solution SMARTSIG:1 Drop(s) Left Eye Morning-Night   [DISCONTINUED] glipiZIDE (GLUCOTROL XL) 5 MG 24 hr tablet Take 1 tablet by mouth once daily with breakfast   [DISCONTINUED] LANTUS SOLOSTAR 100 UNIT/ML Solostar Pen INJECT 28 UNITS SUBCUTANEOUSLY AT BEDTIME   No facility-administered encounter medications on file as of 09/21/2022.    ALLERGIES: No Known Allergies  VACCINATION STATUS: Immunization History  Administered Date(s) Administered   Fluad Quad(high Dose 65+) 06/27/2021   Influenza,inj,Quad PF,6+ Mos 05/29/2022   PFIZER(Purple Top)SARS-COV-2 Vaccination 10/17/2019, 11/07/2019   Pneumococcal Conjugate-13 08/08/2021   Pneumococcal Polysaccharide-23 06/30/2020   Tdap 02/03/2012, 08/30/2022   Zoster Recombinat (Shingrix) 10/05/2021, 11/23/2021    Diabetes He presents for his  follow-up diabetic visit. He has type 2 diabetes mellitus. Onset time: He was diagnosed at approximate age of 39 years. His disease course has been worsening. Pertinent negatives for hypoglycemia include no confusion, headaches, nervousness/anxiousness, pallor, seizures, sweats or tremors. Pertinent negatives for diabetes include no blurred vision, no chest pain, no fatigue, no polydipsia, no polyphagia, no polyuria and no weakness. There are no hypoglycemic complications. Symptoms are improving. Diabetic complications include impotence, nephropathy, peripheral neuropathy, PVD and retinopathy. (He  is reporting legal blindness from diabetic retinopathy on both eyes.) Risk factors for coronary artery disease include dyslipidemia, diabetes mellitus, family history, male sex, hypertension and sedentary lifestyle. Current diabetic treatments: He is currently on Lantus 15-20 units 1-2 times a day. His weight is fluctuating minimally. He is following a generally unhealthy diet. When asked about meal planning, he reported none. His home blood glucose trend is increasing steadily. His breakfast blood glucose range is generally 180-200 mg/dl. His lunch blood glucose range is generally 180-200 mg/dl. His dinner blood glucose range is generally 180-200 mg/dl. His bedtime blood glucose range is generally 180-200 mg/dl. His overall blood glucose range is 180-200 mg/dl. (Chad Kirk presents with his CGM device showing above target glycemic profile. His POC A1c is increasing to 8.7% from 8.2%.   His CGM analysis shows AGP of 51% time in range, 49% level 1 hyperglycemia. He has no hypoglycemia. ) He does not see a podiatrist.Eye exam is current.  Hyperlipidemia This is a chronic problem. The current episode started more than 1 year ago. The problem is uncontrolled. Recent lipid tests were reviewed and are high. Exacerbating diseases include chronic renal disease and diabetes. Pertinent negatives include no chest pain, myalgias or  shortness of breath. Current antihyperlipidemic treatment includes statins. Risk factors for coronary artery disease include diabetes mellitus, dyslipidemia, hypertension, male sex, a sedentary lifestyle and family history.  Hypertension This is a chronic problem. The current episode started more than 1 year ago. The problem is controlled. Pertinent negatives include no blurred vision, chest pain, headaches, neck pain, palpitations, shortness of breath or sweats. Risk factors for coronary artery disease include dyslipidemia, family history, diabetes mellitus, male gender and sedentary lifestyle. Past treatments include calcium channel blockers. Hypertensive end-organ damage includes kidney disease, PVD and retinopathy. Identifiable causes of hypertension include chronic renal disease.       Objective:       09/21/2022    9:17 AM 09/11/2022   10:56 AM 08/30/2022   10:47 AM  Vitals with BMI  Height 5\' 10"     Weight 180 lbs 6 oz    BMI 67.34    Systolic 193 790 240  Diastolic 80 96 78  Pulse 76 86     BP 126/80   Pulse 76   Ht 5\' 10"  (1.778 m)   Wt 180 lb 6.4 oz (81.8 kg)   BMI 25.88 kg/m   Wt Readings from Last 3 Encounters:  09/21/22 180 lb 6.4 oz (81.8 kg)  08/30/22 183 lb 9.6 oz (83.3 kg)  05/29/22 182 lb 6.4 oz (82.7 kg)       CMP ( most recent) CMP     Component Value Date/Time   NA 143 05/16/2022 1027   K 5.0 05/16/2022 1027   CL 103 05/16/2022 1027   CO2 24 05/16/2022 1027   GLUCOSE 137 (H) 05/16/2022 1027   GLUCOSE 256 (H) 12/30/2019 1218   BUN 17 05/16/2022 1027   CREATININE 1.15 05/16/2022 1027   CALCIUM 9.6 05/16/2022 1027   PROT 7.2 05/16/2022 1027   ALBUMIN 4.3 05/16/2022 1027   AST 24 05/16/2022 1027   ALT 31 05/16/2022 1027   ALKPHOS 72 05/16/2022 1027   BILITOT 0.3 05/16/2022 1027   GFRNONAA >60 12/30/2019 1218   GFRAA >60 12/30/2019 1218     Diabetic Labs (most recent): Lab Results  Component Value Date   HGBA1C 8.7 (A) 09/21/2022   HGBA1C  8.2 (A) 05/21/2022   HGBA1C 8.7 (A) 01/30/2022  Lipid Panel ( most recent) Lipid Panel     Component Value Date/Time   CHOL 121 05/16/2022 1027   TRIG 60 05/16/2022 1027   HDL 36 (L) 05/16/2022 1027   CHOLHDL 3.4 05/16/2022 1027   CHOLHDL 4.4 12/30/2019 1218   VLDL 15 12/30/2019 1218   LDLCALC 72 05/16/2022 1027   LABVLDL 13 05/16/2022 1027      Assessment & Plan:   1. Type 2 diabetes, uncontrolled, with retinopathy with macular edema (HCC)  - Chad Kirk has currently uncontrolled symptomatic type 2 DM since  62 years of age.  Chad Kirk presents with his CGM device showing above target glycemic profile. His POC A1c is increasing to 8.7% from 8.2%.   His CGM analysis shows AGP of 51% time in range, 49% level 1 hyperglycemia. He has no hypoglycemia.   - I had a long discussion with him about the progressive nature of diabetes and the pathology behind its complications. -his diabetes is complicated by retinopathy, peripheral arterial disease, peripheral neuropathy and he remains at a high risk for more acute and chronic complications which include CAD, CVA, CKD, retinopathy, and neuropathy. These are all discussed in detail with him.  - I have counseled him on diet  and weight management  by adopting a carbohydrate restricted/protein rich diet. Patient is encouraged to switch to  unprocessed or minimally processed     complex starch and increased protein intake (animal or plant source), fruits, and vegetables. -  he is advised to stick to a routine mealtimes to eat 3 meals  a day and avoid unnecessary snacks ( to snack only to correct hypoglycemia).   The following Lifestyle Medicine recommendations according to [place]  [facility]) were discussed and and offered to patient and he  agrees to start the journey:   - he acknowledges that there is a room for improvement in his food and drink choices. - Suggestion is made for him to avoid simple  carbohydrates  from his diet including Cakes, Sweet Desserts, Ice Cream, Soda (diet and regular), Sweet Tea, Candies, Chips, Cookies, Store Bought Juices, Alcohol , Artificial Sweeteners,  Coffee Creamer, and "Sugar-free" Products, Lemonade. This will help patient to have more stable blood glucose profile and potentially avoid unintended weight gain.  The following Lifestyle Medicine recommendations according to [place]  [facility]) were discussed and and offered to patient and he  agrees to start the journey:  A. Whole Foods, Plant-Based Nutrition comprising of fruits and vegetables, plant-based proteins, whole-grain carbohydrates was discussed in detail with the patient.   A list for source of those nutrients were also provided to the patient.  Patient will use only water or unsweetened tea for hydration. B.  The need to stay away from risky substances including alcohol, smoking; obtaining 7 to 9 hours of restorative sleep, at least 150 minutes of moderate intensity exercise weekly, the importance of healthy social connections,  and stress management techniques were discussed. C.  A full color page of  Calorie density of various food groups per pound showing examples of each food groups was provided to the patient.  He is following with Jearld Fenton for [place]. - I have approached him with the following individualized plan to manage  his diabetes and patient agrees:   -In light of his presentation with above target   glycemic profile, he  may need multiple daily injections of insullin once he is comfortable to do so. In  the meantime , he is advised to increase Lantus to 30 units qhs. He will be considered for premixed insulin Novolog 70/30 or Humalog 75/25 I if he still presents with hyperglycemia during next visit.   He is advised to monitor glucose continuously. - he is encouraged to call clinic for blood glucose levels less than 70 or above 200 mg /dl.  - he is warned  not to take insulin without proper monitoring per orders.  -He has responded and benefited from low-dose glipizide.  He is advised to continue glipizide 5 mg XL p.o. daily at breakfast.      - Specific targets for  A1c;  LDL, HDL,  and Triglycerides were discussed with the patient.  2) Blood Pressure /Hypertension: -His blood pressure is controlled to target. he is advised to continue his current medications including amlodipine 10 mg p.o.. daily with breakfast .  He will be considered for HCTZ/lisinopril next visit.   3) Lipids/Hyperlipidemia:   Review of his recent lipid panel showed significantly improved LDL at 72 from 149.  This is mainly due to his dietary change.  He is advised to continue Crestor 10 mg p.o. daily at bedtime   Side effects and precautions discussed with him.     4)  Weight/Diet:  Body mass index is 25.88 kg/m.  -   he is not a candidate for weight loss. Exercise, and detailed carbohydrates information provided  -  detailed on discharge instructions.  5) Chronic Care/Health Maintenance:  -he  is on  Statin medications and  is encouraged to initiate and continue to follow up with Ophthalmology, Dentist,  Podiatrist at least yearly or according to recommendations, and advised to   stay away from smoking. I have recommended yearly flu vaccine and pneumonia vaccine at least every 5 years; moderate intensity exercise for up to 150 minutes weekly; and  sleep for at least 7 hours a day.  6) vitamin D deficiency: He is being initiated on vitamin D3 5000 units daily for the next 6 months.  His screening ABI was normal in September 2021.  He has left lower extremity diabetic foot ulcer, under care with podiatry.    His next study is due in September 2027, or sooner if needed.  - he is  advised to maintain close follow up with his pcp for primary care needs, as well as his other providers for optimal and coordinated care    I spent  26 minutes in the care of the patient  today including review of labs from [place], Lipids, Thyroid Function, Hematology (current and previous including abstractions from other facilities); face-to-face time discussing  his blood glucose readings/logs, discussing hypoglycemia and hyperglycemia episodes and symptoms, medications doses, his options of short and long term treatment based on the latest standards of care / guidelines;  discussion about incorporating lifestyle medicine;  and documenting the encounter. Risk reduction counseling performed per USPSTF guidelines to reduce  cardiovascular risk factors.     Please refer to Patient Instructions for Blood Glucose Monitoring and Insulin/Medications Dosing Guide"  in media tab for additional information. Please  also refer to " Patient Self Inventory" in the Media  tab for reviewed elements of pertinent patient history.  Chad Kirk participated in the discussions, expressed understanding, and voiced agreement with the above plans.  All questions were answered to his satisfaction. he is encouraged to contact clinic should he have any questions or concerns prior to his return visit.   Follow up plan: -  Return in about 3 months (around 12/20/2022) for F/U with Pre-visit Labs, Meter/CGM/Logs, A1c here.  Glade Lloyd, MD [facility] [address] [place], [place] [address] Phone: [phone]  Fax: [phone]    09/21/2022, 12:22 PM  This note was partially dictated with voice recognition software. Similar sounding words can be transcribed inadequately or may not  be corrected upon review.

## 2022-09-21 NOTE — Patient Instructions (Signed)

## 2022-09-25 ENCOUNTER — Telehealth: Payer: Self-pay | Admitting: Internal Medicine

## 2022-09-25 NOTE — Telephone Encounter (Signed)
Patient is requesting refill on   benzonatate (TESSALON) 200 MG capsule   Still experiencing  cough

## 2022-09-26 ENCOUNTER — Other Ambulatory Visit: Payer: Self-pay | Admitting: Internal Medicine

## 2022-09-26 DIAGNOSIS — R059 Cough, unspecified: Secondary | ICD-10-CM

## 2022-09-26 MED ORDER — BENZONATATE 200 MG PO CAPS: 200.0000 mg | ORAL_CAPSULE | Freq: Two times a day (BID) | ORAL | 1 refills | Status: DC | PRN

## 2022-09-26 NOTE — Telephone Encounter (Signed)
Patient advised.

## 2022-09-30 ENCOUNTER — Other Ambulatory Visit: Payer: Self-pay | Admitting: "Endocrinology

## 2022-09-30 DIAGNOSIS — Z794 Long term (current) use of insulin: Secondary | ICD-10-CM

## 2022-10-11 ENCOUNTER — Ambulatory Visit (INDEPENDENT_AMBULATORY_CARE_PROVIDER_SITE_OTHER): Payer: 59 | Admitting: Podiatry

## 2022-10-11 DIAGNOSIS — L97512 Non-pressure chronic ulcer of other part of right foot with fat layer exposed: Secondary | ICD-10-CM

## 2022-10-11 MED ORDER — CEPHALEXIN 500 MG PO CAPS: 500.0000 mg | ORAL_CAPSULE | Freq: Three times a day (TID) | ORAL | 0 refills | Status: AC

## 2022-10-11 NOTE — Progress Notes (Signed)
  Subjective:  Patient ID: Chad Kirk, male    DOB: 1960/10/20,  MRN: [id number]  Chief Complaint  Patient presents with   Diabetic Ulcer    Patient cut his toe he not really sure what happened. His great toe started bleeding so he concerned. BS-101.    62 y.o. male presents with new open wound on his right great toe.  He thinks that some skin callus area caught on some sheets and pulled off there creating the wound.  He does have a history of diabetes.  Does have neuropathy and takes insulin as well.  Past Medical History:  Diagnosis Date   Diabetes mellitus without complication ([place])    DIAGNOSED AGE 12   Diabetic ulcer of left great toe ([place]) 01/25/2021   Healed diabetic foot ulcer 02/22/2021   Hypertension     No Known Allergies  ROS: Negative except as per HPI above  Objective:  General: AAO x3, NAD  Dermatological: Ulceration present at the medial aspect of the distal phalanx of the right hallux.  There is healthy granular tissue present in the wound base.  Probes to subcutaneous fat tissue very superficial.  Measures approximately 0.5 x 0.4 cm.  No significant erythema no malodor no drainage.  Vascular:  Dorsalis Pedis artery and Posterior Tibial artery pedal pulses are 1/4 bilateral.  Capillary fill time < 3 sec to all digits.   Neruologic: Grossly diminished to light touch and protective sensation is absent.   Musculoskeletal: No gross boney pedal deformities bilateral. No pain, crepitus, or limitation noted with foot and ankle range of motion bilateral. Muscular strength 5/5 in all groups tested bilateral.  Gait: Unassisted, Nonantalgic.     Radiographs:  Deferred Assessment:   1. Skin ulcer of right great toe with fat layer exposed ([place])      Plan:  Patient was evaluated and treated and all questions answered.  Ulcer medial aspect of the right hallux distal phalanx to subcutaneous fat tissue with healthy granular tissue -We discussed the etiology and  factors that are a part of the wound healing process.  We also discussed the risk of infection both soft tissue and osteomyelitis from open ulceration.  Discussed the risk of limb loss if this happens or worsens. -No debridement indicated due to healthy wound bed -Dressed with Betadine, DSD. -Continue home dressing changes daily with Betadine and Ace bandage -Continue off-loading with felt padding. -Vascular testing deferred but will consider if wound fails to heal in the future -HgbA1c: 8.7 -Last antibiotics: E Rx for 500 mg cephalexin 3 times daily for 5 days for prophylaxis as patient does have a history of diabetes. -Imaging: Deferred as very superficial ulceration  Return in about 3 weeks (around 11/01/2022) for F/u R hallux ulcer.          Everitt Amber, DPM [facility]

## 2022-11-01 ENCOUNTER — Ambulatory Visit (INDEPENDENT_AMBULATORY_CARE_PROVIDER_SITE_OTHER): Payer: 59 | Admitting: Podiatry

## 2022-11-01 DIAGNOSIS — Z91199 Patient's noncompliance with other medical treatment and regimen due to unspecified reason: Secondary | ICD-10-CM

## 2022-11-01 NOTE — Progress Notes (Signed)
Pt was a no show for apt, charge generated 

## 2022-11-16 ENCOUNTER — Other Ambulatory Visit: Payer: Self-pay | Admitting: "Endocrinology

## 2022-11-28 ENCOUNTER — Ambulatory Visit (INDEPENDENT_AMBULATORY_CARE_PROVIDER_SITE_OTHER): Payer: 59

## 2022-11-28 ENCOUNTER — Ambulatory Visit (INDEPENDENT_AMBULATORY_CARE_PROVIDER_SITE_OTHER): Payer: 59 | Admitting: Podiatry

## 2022-11-28 ENCOUNTER — Encounter: Payer: Self-pay | Admitting: Podiatry

## 2022-11-28 DIAGNOSIS — M79675 Pain in left toe(s): Secondary | ICD-10-CM | POA: Diagnosis not present

## 2022-11-28 DIAGNOSIS — L84 Corns and callosities: Secondary | ICD-10-CM

## 2022-11-28 DIAGNOSIS — Z794 Long term (current) use of insulin: Secondary | ICD-10-CM

## 2022-11-28 DIAGNOSIS — L0291 Cutaneous abscess, unspecified: Secondary | ICD-10-CM

## 2022-11-28 DIAGNOSIS — M79674 Pain in right toe(s): Secondary | ICD-10-CM | POA: Diagnosis not present

## 2022-11-28 DIAGNOSIS — E1142 Type 2 diabetes mellitus with diabetic polyneuropathy: Secondary | ICD-10-CM

## 2022-11-28 DIAGNOSIS — Z1881 Retained glass fragments: Secondary | ICD-10-CM

## 2022-11-28 DIAGNOSIS — B351 Tinea unguium: Secondary | ICD-10-CM

## 2022-11-28 DIAGNOSIS — T148XXA Other injury of unspecified body region, initial encounter: Secondary | ICD-10-CM | POA: Diagnosis not present

## 2022-11-28 LAB — HM DIABETES EYE EXAM

## 2022-11-28 MED ORDER — GENTAMICIN SULFATE 0.1 % EX CREA
TOPICAL_CREAM | CUTANEOUS | 0 refills | Status: AC
Start: 2022-11-28 — End: ?

## 2022-11-28 NOTE — Patient Instructions (Signed)
DRESSING CHANGES LEFT FOOT:   PHARMACY SHOPPING LIST: Saline or Wound Cleanser for cleaning wound 2 x 2 inch sterile gauze for cleaning wound GENTAMICIN CREAM  A. IF DISPENSED, WEAR SURGICAL SHOE OR WALKING BOOT AT ALL TIMES.  B. IF PRESCRIBED ORAL ANTIBIOTICS, TAKE ALL MEDICATION AS PRESCRIBED UNTIL ALL ARE GONE.  C. IF DOCTOR HAS DESIGNATED NONWEIGHTBEARING STATUS, PLEASE ADHERE TO INSTRUCTIONS.  1. KEEP LEFT FOOT DRY AT ALL TIMES!!!!  2. CLEANSE ULCER WITH SALINE OR WOUND CLEANSER.  3. DAB DRY WITH GAUZE SPONGE.  4. APPLY A LIGHT AMOUNT OF GENTAMICIN CREAM TO BASE OF ULCER.  5. APPLY OUTER DRESSING AS INSTRUCTED.  6. WEAR SURGICAL SHOE/BOOT DAILY AT ALL TIMES. IF SUPPLIED, WEAR HEEL PROTECTORS AT ALL TIMES WHEN IN BED.  7. DO NOT WALK BAREFOOT!!!  8.  IF YOU EXPERIENCE ANY FEVER, CHILLS, NIGHTSWEATS, NAUSEA OR VOMITING, ELEVATED OR LOW BLOOD SUGARS, REPORT TO EMERGENCY ROOM.  9. IF YOU EXPERIENCE INCREASED REDNESS, PAIN, SWELLING, DISCOLORATION, ODOR, PUS, DRAINAGE OR WARMTH OF YOUR FOOT, REPORT TO EMERGENCY ROOM.

## 2022-11-28 NOTE — Progress Notes (Signed)
Subjective:  Patient ID: Chad Kirk, male    DOB: [birth year],  MRN: [id number]  Chad Kirk presents to clinic today for at risk foot care with history of diabetic neuropathy and preulcerative lesion(s) left great toe and painful mycotic toenails that limit ambulation. Painful toenails interfere with ambulation. Aggravating factors include wearing enclosed shoe gear. Pain is relieved with periodic professional debridement. Painful porokeratotic lesions are aggravated when weightbearing with and without shoegear. Pain is relieved with periodic professional debridement.  Chief Complaint  Patient presents with   Diabetes    DFC BS - 69 A1C - 7.SOMETHING LVPCP - 08/2022   New problem(s): Patient notes none.  He was unaware of lesion on left heel. States he doesn't remember any trauma or broken glass he encountered    PCP is Anabel Halon, MD.  No Known Allergies  Review of Systems: Negative except as noted in the HPI.  Objective: No changes noted in today's physical examination. There were no vitals filed for this visit.  Chad Kirk is a pleasant 62 y.o. male WD, WN in NAD. AAO x 3.  Vascular Examination: CFT <3 seconds b/l. DP pulses faintly palpable b/l. PT pulses nonpalpable b/l. Digital hair absent. Skin temperature gradient warm to warm b/l. No pain with calf compression. No ischemia or gangrene. No cyanosis or clubbing noted b/l. No edema b/l LE.   Neurological Examination: Protective sensation diminished with 10g monofilament b/l. Vibratory sensation intact b/l.  Dermatological Examination: Pedal skin warm and supple b/l. No open wounds b/l. No interdigital macerations.   Toenails 1-5 b/l thick, discolored, elongated with subungual debris and pain on dorsal palpation.    Preulcerative lesion noted plantar aspect left hallux. There is visible subdermal hemorrhage. There is no surrounding erythema, no edema, no drainage, no odor, no fluctuance.  Evidence of laceration  plantar left heel with overlying hyperkeratosis with postinflammatory hyperpigmentation revealing underlying abscess with less than 1 cc of creamy, white drainage. Probing revealed intact shard of glass. No tracking or tunneling into deeper tissues. No odor, no surrounding erythema or edema. No warmth. (Note: Picture with pus did not cross over to EPIC).    Musculoskeletal Examination: Muscle strength 5/5 to b/l LE. HAV with bunion bilaterally and hammertoes 2-5 b/l. Patient ambulates independent of any assistive aids.  Xray findings left foot s/p FBO excision: No gas in tissues left foot. No evidence of bone erosion at location of known FBO. No evidence of fracture left foot. No foreign body evident left foot.  Assessment/Plan: 1. Abscess   2. Pain due to onychomycosis of toenails of both feet   3. Superficial glass foreign body   4. Pre-ulcerative calluses   5. Type 2 diabetes mellitus with diabetic polyneuropathy, with long-term current use of insulin     Meds ordered this encounter  Medications   gentamicin cream (GARAMYCIN) 0.1 %    Sig: Apply to left heel once daily until healed.    Dispense:  30 g    Refill:  0    DG FOOT COMPLETE LEFT -Patient was evaluated and treated. All patient's and/or POA's questions/concerns answered on today's visit. -Superficial FBO left heel removed without complication. Wound culture obtained from left heel. Wound irrigated with wound cleanser. Betadine Ointment and light dressing applied. Instructions dispensed for daily local wound care with Gentamicin Cream. He has Rx filledfor cephalexin prescribed by Dr. Annamary Rummage on last visit which patient never took. He was advised to take cephalexin 500 mg po tid x 10 days. He  related understanding . -Patient to continue soft, supportive shoe gear daily. -Toenails 1-5 b/l were debrided in length and girth with sterile nail nippers and dremel without iatrogenic bleeding.  -Callus(es) L hallux pared  utilizing sharp debridement with sterile blade without complication or incident. Total number debrided =1. -Xray of left foot was performed and reviewed with patient and/or POA. -Patient scheduled to see Dr. Sharl Ma in one week for follow up of s/p excision of FBO/abscess left heel. -Patient/POA to call should there be question/concern in the interim.   Return in about 1 week (around 12/05/2022).  Freddie Breech, DPM

## 2022-11-29 LAB — WOUND CULTURE: SPECIMEN QUALITY:: ADEQUATE

## 2022-11-30 LAB — WOUND CULTURE: MICRO NUMBER:: 14806731

## 2022-12-01 LAB — WOUND CULTURE

## 2022-12-05 ENCOUNTER — Ambulatory Visit (INDEPENDENT_AMBULATORY_CARE_PROVIDER_SITE_OTHER): Payer: 59 | Admitting: Podiatry

## 2022-12-05 DIAGNOSIS — Z1881 Retained glass fragments: Secondary | ICD-10-CM | POA: Diagnosis not present

## 2022-12-05 DIAGNOSIS — T148XXA Other injury of unspecified body region, initial encounter: Secondary | ICD-10-CM | POA: Diagnosis not present

## 2022-12-05 NOTE — Progress Notes (Signed)
  Subjective:  Patient ID: Diron Haddon, male    DOB: 1961-08-07,  MRN: [id number]  Chief Complaint  Patient presents with   Wound Check    foreign body object left heel with abscess    62 y.o. male presents with the above complaint. History confirmed with patient.  He says it is feeling much better not having any pain or drainage noted  Objective:  Physical Exam: warm, good capillary refill, normal DP and PT pulses, and puncture site has healed and fully epithelialized.  Assessment:   1. Superficial glass foreign body      Plan:  Patient was evaluated and treated and all questions answered.  Doing much better and appears to be healing well.  I do not see any evidence of residual foreign body, infection abscess or ulceration.  He completed the cephalexin course that was previously prescribed.  Can leave open to air and continue the gentamicin cream until the skin and scab goes away.  I will see him back as needed  Return if symptoms worsen or fail to improve.

## 2022-12-11 ENCOUNTER — Other Ambulatory Visit: Payer: Self-pay | Admitting: Internal Medicine

## 2022-12-11 DIAGNOSIS — N529 Male erectile dysfunction, unspecified: Secondary | ICD-10-CM

## 2022-12-12 ENCOUNTER — Other Ambulatory Visit: Payer: Self-pay

## 2022-12-12 DIAGNOSIS — E782 Mixed hyperlipidemia: Secondary | ICD-10-CM

## 2022-12-12 DIAGNOSIS — E113592 Type 2 diabetes mellitus with proliferative diabetic retinopathy without macular edema, left eye: Secondary | ICD-10-CM

## 2022-12-12 MED ORDER — FREESTYLE LIBRE 2 SENSOR MISC
3 refills | Status: DC
Start: 2022-12-12 — End: 2023-04-10

## 2022-12-12 MED ORDER — ROSUVASTATIN CALCIUM 10 MG PO TABS
10.0000 mg | ORAL_TABLET | Freq: Every day | ORAL | 0 refills | Status: DC
Start: 2022-12-12 — End: 2023-05-13

## 2022-12-12 MED ORDER — LANTUS SOLOSTAR 100 UNIT/ML ~~LOC~~ SOPN: 30.0000 [IU] | PEN_INJECTOR | Freq: Every day | SUBCUTANEOUS | 2 refills | Status: DC

## 2022-12-15 LAB — COMPREHENSIVE METABOLIC PANEL
ALT: 38 IU/L (ref 0–44)
AST: 26 IU/L (ref 0–40)
Albumin/Globulin Ratio: 1.4 (ref 1.2–2.2)
Albumin: 4.2 g/dL (ref 3.9–4.9)
Alkaline Phosphatase: 69 IU/L (ref 44–121)
BUN/Creatinine Ratio: 14 (ref 10–24)
BUN: 18 mg/dL (ref 8–27)
Bilirubin Total: 0.4 mg/dL (ref 0.0–1.2)
CO2: 21 mmol/L (ref 20–29)
Calcium: 9.6 mg/dL (ref 8.6–10.2)
Chloride: 105 mmol/L (ref 96–106)
Creatinine, Ser: 1.28 mg/dL — ABNORMAL HIGH (ref 0.76–1.27)
Globulin, Total: 3 g/dL (ref 1.5–4.5)
Glucose: 79 mg/dL (ref 70–99)
Potassium: 4.4 mmol/L (ref 3.5–5.2)
Sodium: 140 mmol/L (ref 134–144)
Total Protein: 7.2 g/dL (ref 6.0–8.5)
eGFR: 63 mL/min/{1.73_m2} (ref >59)

## 2022-12-15 LAB — LIPID PANEL
Chol/HDL Ratio: 3.1 ratio (ref 0.0–5.0)
Cholesterol, Total: 117 mg/dL (ref 100–199)
HDL: 38 mg/dL — ABNORMAL LOW (ref >39)
LDL Chol Calc (NIH): 64 mg/dL (ref 0–99)
Triglycerides: 75 mg/dL (ref 0–149)
VLDL Cholesterol Cal: 15 mg/dL (ref 5–40)

## 2022-12-15 LAB — TSH: TSH: 2.01 u[IU]/mL (ref 0.450–4.500)

## 2022-12-15 LAB — VITAMIN D 25 HYDROXY (VIT D DEFICIENCY, FRACTURES): Vit D, 25-Hydroxy: 46.1 ng/mL (ref 30.0–100.0)

## 2022-12-15 LAB — T4, FREE: Free T4: 1.29 ng/dL (ref 0.82–1.77)

## 2022-12-21 ENCOUNTER — Encounter: Payer: Self-pay | Admitting: "Endocrinology

## 2022-12-21 ENCOUNTER — Ambulatory Visit (INDEPENDENT_AMBULATORY_CARE_PROVIDER_SITE_OTHER): Payer: 59 | Admitting: "Endocrinology

## 2022-12-21 VITALS — BP 116/78 | HR 84 | Ht 70.0 in | Wt 183.0 lb

## 2022-12-21 DIAGNOSIS — E782 Mixed hyperlipidemia: Secondary | ICD-10-CM | POA: Diagnosis not present

## 2022-12-21 DIAGNOSIS — E113592 Type 2 diabetes mellitus with proliferative diabetic retinopathy without macular edema, left eye: Secondary | ICD-10-CM | POA: Diagnosis not present

## 2022-12-21 DIAGNOSIS — Z794 Long term (current) use of insulin: Secondary | ICD-10-CM | POA: Diagnosis not present

## 2022-12-21 DIAGNOSIS — I10 Essential (primary) hypertension: Secondary | ICD-10-CM | POA: Diagnosis not present

## 2022-12-21 LAB — POCT GLYCOSYLATED HEMOGLOBIN (HGB A1C): HbA1c, POC (controlled diabetic range): 8.3 % — AB (ref 0.0–7.0)

## 2022-12-21 MED ORDER — DAPAGLIFLOZIN PROPANEDIOL 5 MG PO TABS
5.0000 mg | ORAL_TABLET | Freq: Every day | ORAL | 1 refills | Status: DC
Start: 2022-12-21 — End: 2023-04-26

## 2022-12-21 MED ORDER — LANTUS SOLOSTAR 100 UNIT/ML ~~LOC~~ SOPN: 26.0000 [IU] | PEN_INJECTOR | Freq: Every day | SUBCUTANEOUS | 1 refills | Status: DC

## 2022-12-21 NOTE — Progress Notes (Unsigned)
12/21/2022, 4:08 PM  Endocrinology follow-up note   Subjective:    Patient ID: Chad Kirk, male    DOB: 06/18/1961.  Chad Kirk is being seen in follow-up after he was seen in consultation for management of currently uncontrolled symptomatic diabetes requested by  Anabel Halon, MD.   Past Medical History:  Diagnosis Date   Diabetes mellitus without complication (HCC)    DIAGNOSED AGE 62   Diabetic ulcer of left great toe (HCC) 01/25/2021   Healed diabetic foot ulcer 02/22/2021   Hypertension     Past Surgical History:  Procedure Laterality Date   ESOPHAGEAL DILATION N/A 12/07/2019   Procedure: ESOPHAGEAL OR PYLORIC DILATION;  Surgeon: West Bali, MD;  Location: [facility];  Service: Endoscopy;  Laterality: N/A;   ESOPHAGOGASTRODUODENOSCOPY N/A 12/07/2019   erosive gastritis, many non-bleeding cratered and superficial duodenal ulcers without stigmata of bleeding in duodenal bulb. Empiric dilation due to possible occult esophageal web. Negative H.pylori.    EYE SURGERY  03/02/2020   diabetic retinopathy, lens placement, cataract removal, right eye   HERNIA REPAIR Left 1990    Social History   Socioeconomic History   Marital status: Married    Spouse name: Not on file   Number of children: Not on file   Years of education: Not on file   Highest education level: Not on file  Occupational History   Not on file  Tobacco Use   Smoking status: Former    Packs/day: 1.00    Years: 5.00    Additional pack years: 0.00    Total pack years: 5.00    Types: Cigarettes    Quit date: 08/20/1988    Years since quitting: 34.3   Smokeless tobacco: Never  Vaping Use   Vaping Use: Never used  Substance and Sexual Activity   Alcohol use: Not Currently    Comment: denied 03/15/20   Drug use: No   Sexual activity: Not on file  Other Topics Concern   Not on file  Social History Narrative    Not on file   Social Determinants of Health   Financial Resource Strain: Not on file  Food Insecurity: Not on file  Transportation Needs: Not on file  Physical Activity: Not on file  Stress: Not on file  Social Connections: Not on file    Family History  Problem Relation Age of Onset   Stroke Mother    Hypertension Mother    Diabetes Mother    Hypertension Father    Diabetes Father    Colon cancer Neg Hx    Colon polyps Neg Hx     Outpatient Encounter Medications as of 12/21/2022  Medication Sig   dapagliflozin propanediol (FARXIGA) 5 MG TABS tablet Take 1 tablet (5 mg total) by mouth daily before breakfast.   amLODipine (NORVASC) 10 MG tablet Take 1 tablet (10 mg total) by mouth daily.   benzonatate (TESSALON) 200 MG capsule Take 1 capsule (200 mg total) by mouth 2 (two) times daily as needed for cough.   blood glucose meter kit and supplies KIT 1 each by Does not  apply route 4 (four) times daily. Dispense based on patient and insurance preference. Use up to four times daily as directed.   Blood Glucose Monitoring Suppl (ACCU-CHEK GUIDE ME) w/Device KIT 1 Piece by Does not apply route as directed.   Carboxymethylcellulose Sodium (LUBRICANT EYE DROPS OP) Apply 1 Container to eye as needed (dry eye). Left eye   Cholecalciferol (VITAMIN D3) 125 MCG (5000 UT) CAPS Take 1 capsule (5,000 Units total) by mouth daily.   Continuous Blood Gluc Receiver (FREESTYLE LIBRE 2 READER) DEVI As directed   Continuous Glucose Sensor (FREESTYLE LIBRE 2 SENSOR) MISC Change sensor every 14 days   dorzolamide (TRUSOPT) 2 % ophthalmic solution Place 1 drop into the left eye 3 (three) times daily.   finasteride (PROSCAR) 5 MG tablet Take 1 tablet (5 mg total) by mouth daily.   FUROSEMIDE PO Take by mouth daily.   gentamicin cream (GARAMYCIN) 0.1 % Apply to left heel once daily until healed.   glucose blood (ONETOUCH VERIO) test strip USE 1 STRIP TO CHECK GLUCOSE UP TO 2 TIMES DAILY   Insulin Pen Needle  (B-D ULTRAFINE III SHORT PEN) 31G X 8 MM MISC 1 each by Does not apply route as directed.   Lancets (ONETOUCH DELICA PLUS LANCET33G) MISC Apply topically.   LANTUS SOLOSTAR 100 UNIT/ML Solostar Pen Inject 26 Units into the skin at bedtime.   losartan (COZAAR) 100 MG tablet Take 1 tablet (100 mg total) by mouth daily.   Multiple Vitamins-Minerals (QC DAILY MULTIVIT/MULTIMINERAL PO) Take 1 tablet by mouth daily.   prednisoLONE acetate (PRED FORTE) 1 % ophthalmic suspension 1 drop 4 (four) times daily.   Probiotic Product (PROBIOTIC PO) Take 1 tablet by mouth daily.   rosuvastatin (CRESTOR) 10 MG tablet Take 1 tablet (10 mg total) by mouth daily.   senna-docusate (SENOKOT-S) 8.6-50 MG tablet Take 1 tablet by mouth as needed.    sildenafil (VIAGRA) 100 MG tablet TAKE 1 TABLET BY MOUTH ONCE DAILY AS NEEDED FOR ERECTILE DYSFUNCTION   tamsulosin (FLOMAX) 0.4 MG CAPS capsule Take 1 capsule (0.4 mg total) by mouth daily.   timolol (TIMOPTIC) 0.5 % ophthalmic solution Place 1 drop into both eyes 3 times daily.   UNABLE TO FIND Take 5 mLs by mouth every 4 (four) hours as needed. Med Name: Apothecary Cough Syrup Take 5 mL by mouth every 4 to 6 hours as needed for cough   [DISCONTINUED] glipiZIDE (GLUCOTROL) 5 MG tablet Take 1 tablet (5 mg total) by mouth 2 (two) times daily before a meal.   [DISCONTINUED] LANTUS SOLOSTAR 100 UNIT/ML Solostar Pen Inject 30 Units into the skin at bedtime.   No facility-administered encounter medications on file as of 12/21/2022.    ALLERGIES: No Known Allergies  VACCINATION STATUS: Immunization History  Administered Date(s) Administered   Fluad Quad(high Dose 65+) 06/27/2021   Influenza,inj,Quad PF,6+ Mos 05/29/2022   PFIZER(Purple Top)SARS-COV-2 Vaccination 10/17/2019, 11/07/2019   Pneumococcal Conjugate-13 08/08/2021   Pneumococcal Polysaccharide-23 06/30/2020   Tdap 02/03/2012, 08/30/2022   Zoster Recombinat (Shingrix) 10/05/2021, 11/23/2021    Diabetes He  presents for his follow-up diabetic visit. He has type 2 diabetes mellitus. Onset time: He was diagnosed at approximate age of 39 years. His disease course has been worsening. Pertinent negatives for hypoglycemia include no confusion, headaches, nervousness/anxiousness, pallor, seizures, sweats or tremors. Pertinent negatives for diabetes include no blurred vision, no chest pain, no fatigue, no polydipsia, no polyphagia, no polyuria and no weakness. There are no hypoglycemic complications. Symptoms are improving.  Diabetic complications include impotence, nephropathy, peripheral neuropathy, PVD and retinopathy. (He is reporting legal blindness from diabetic retinopathy on both eyes.) Risk factors for coronary artery disease include dyslipidemia, diabetes mellitus, family history, male sex, hypertension and sedentary lifestyle. Current diabetic treatments: He is currently on Lantus 15-20 units 1-2 times a day. His weight is fluctuating minimally. He is following a generally unhealthy diet. When asked about meal planning, he reported none. His home blood glucose trend is increasing steadily. His breakfast blood glucose range is generally 180-200 mg/dl. His Kirk blood glucose range is generally 180-200 mg/dl. His dinner blood glucose range is generally 180-200 mg/dl. His bedtime blood glucose range is generally 180-200 mg/dl. His overall blood glucose range is 180-200 mg/dl. (Chad Kirk presents with his CGM device showing above target glycemic profile. His POC A1c is increasing to 8.7% from 8.2%.   His CGM analysis shows AGP of 51% time in range, 49% level 1 hyperglycemia. He has no hypoglycemia. ) He does not see a podiatrist.Eye exam is current.  Hyperlipidemia This is a chronic problem. The current episode started more than 1 year ago. The problem is uncontrolled. Recent lipid tests were reviewed and are high. Exacerbating diseases include chronic renal disease and diabetes. Pertinent negatives include no chest  pain, myalgias or shortness of breath. Current antihyperlipidemic treatment includes statins. Risk factors for coronary artery disease include diabetes mellitus, dyslipidemia, hypertension, male sex, a sedentary lifestyle and family history.  Hypertension This is a chronic problem. The current episode started more than 1 year ago. The problem is controlled. Pertinent negatives include no blurred vision, chest pain, headaches, neck pain, palpitations, shortness of breath or sweats. Risk factors for coronary artery disease include dyslipidemia, family history, diabetes mellitus, male gender and sedentary lifestyle. Past treatments include calcium channel blockers. Hypertensive end-organ damage includes kidney disease, PVD and retinopathy. Identifiable causes of hypertension include chronic renal disease.       Objective:       12/21/2022    9:03 AM 09/21/2022    9:17 AM 09/11/2022   10:56 AM  Vitals with BMI  Height 5\' 10"  5\' 10"    Weight 183 lbs 180 lbs 6 oz   BMI 26.26 25.88   Systolic 116 126 086  Diastolic 78 80 96  Pulse 84 76 86    BP 116/78   Pulse 84   Ht 5\' 10"  (1.778 m)   Wt 183 lb (83 kg)   BMI 26.26 kg/m   Wt Readings from Last 3 Encounters:  12/21/22 183 lb (83 kg)  09/21/22 180 lb 6.4 oz (81.8 kg)  08/30/22 183 lb 9.6 oz (83.3 kg)       CMP ( most recent) CMP     Component Value Date/Time   NA 140 12/14/2022 0841   K 4.4 12/14/2022 0841   CL 105 12/14/2022 0841   CO2 21 12/14/2022 0841   GLUCOSE 79 12/14/2022 0841   GLUCOSE 256 (H) 12/30/2019 1218   BUN 18 12/14/2022 0841   CREATININE 1.28 (H) 12/14/2022 0841   CALCIUM 9.6 12/14/2022 0841   PROT 7.2 12/14/2022 0841   ALBUMIN 4.2 12/14/2022 0841   AST 26 12/14/2022 0841   ALT 38 12/14/2022 0841   ALKPHOS 69 12/14/2022 0841   BILITOT 0.4 12/14/2022 0841   GFRNONAA >60 12/30/2019 1218   GFRAA >60 12/30/2019 1218     Diabetic Labs (most recent): Lab Results  Component Value Date   HGBA1C 8.3 (A)  12/21/2022   HGBA1C 8.7 (  A) 09/21/2022   HGBA1C 8.2 (A) 05/21/2022     Lipid Panel ( most recent) Lipid Panel     Component Value Date/Time   CHOL 117 12/14/2022 0841   TRIG 75 12/14/2022 0841   HDL 38 (L) 12/14/2022 0841   CHOLHDL 3.1 12/14/2022 0841   CHOLHDL 4.4 12/30/2019 1218   VLDL 15 12/30/2019 1218   LDLCALC 64 12/14/2022 0841   LABVLDL 15 12/14/2022 0841      Assessment & Plan:   1. Type 2 diabetes, uncontrolled, with retinopathy with macular edema (HCC)  - Chad Kirk has currently uncontrolled symptomatic type 2 DM since  62 years of age.  Chad Kirk presents with his CGM device showing above target glycemic profile. His POC A1c is increasing to 8.7% from 8.2%.   His CGM analysis shows AGP of 51% time in range, 49% level 1 hyperglycemia. He has no hypoglycemia.   - I had a long discussion with him about the progressive nature of diabetes and the pathology behind its complications. -his diabetes is complicated by retinopathy, peripheral arterial disease, peripheral neuropathy and he remains at a high risk for more acute and chronic complications which include CAD, CVA, CKD, retinopathy, and neuropathy. These are all discussed in detail with him.  - I have counseled him on diet  and weight management  by adopting a carbohydrate restricted/protein rich diet. Patient is encouraged to switch to  unprocessed or minimally processed     complex starch and increased protein intake (animal or plant source), fruits, and vegetables. -  he is advised to stick to a routine mealtimes to eat 3 meals  a day and avoid unnecessary snacks ( to snack only to correct hypoglycemia).   The following Lifestyle Medicine recommendations according to American College of Lifestyle Medicine  [facility]) were discussed and and offered to patient and he  agrees to start the journey:   - he acknowledges that there is a room for improvement in his food and drink choices. - Suggestion is made for him to  avoid simple carbohydrates  from his diet including Cakes, Sweet Desserts, Ice Cream, Soda (diet and regular), Sweet Tea, Candies, Chips, Cookies, Store Bought Juices, Alcohol , Artificial Sweeteners,  Coffee Creamer, and "Sugar-free" Products, Lemonade. This will help patient to have more stable blood glucose profile and potentially avoid unintended weight gain.  The following Lifestyle Medicine recommendations according to American College of Lifestyle Medicine  [facility]) were discussed and and offered to patient and he  agrees to start the journey:  A. Whole Foods, Plant-Based Nutrition comprising of fruits and vegetables, plant-based proteins, whole-grain carbohydrates was discussed in detail with the patient.   A list for source of those nutrients were also provided to the patient.  Patient will use only water or unsweetened tea for hydration. B.  The need to stay away from risky substances including alcohol, smoking; obtaining 7 to 9 hours of restorative sleep, at least 150 minutes of moderate intensity exercise weekly, the importance of healthy social connections,  and stress management techniques were discussed. C.  A full color page of  Calorie density of various food groups per pound showing examples of each food groups was provided to the patient.  He is following with Norm Salt for DE. - I have approached him with the following individualized plan to manage  his diabetes and patient agrees:   -In light of his presentation with above target   glycemic profile, he  may need multiple  daily injections of insullin once he is comfortable to do so. In the meantime , he is advised to increase Lantus to 30 units qhs. He will be considered for premixed insulin Novolog 70/30 or Humalog 75/25 I if he still presents with hyperglycemia during next visit.   He is advised to monitor glucose continuously. - he is encouraged to call clinic for blood glucose levels less than 70 or above 200 mg /dl.  -  he is warned not to take insulin without proper monitoring per orders.  -He has responded and benefited from low-dose glipizide.  He is advised to continue glipizide 5 mg XL p.o. daily at breakfast.      - Specific targets for  A1c;  LDL, HDL,  and Triglycerides were discussed with the patient.  2) Blood Pressure /Hypertension: -His blood pressure is controlled to target. he is advised to continue his current medications including amlodipine 10 mg p.o.. daily with breakfast .  He will be considered for HCTZ/lisinopril next visit.   3) Lipids/Hyperlipidemia:   Review of his recent lipid panel showed significantly improved LDL at 72 from 149.  This is mainly due to his dietary change.  He is advised to continue Crestor 10 mg p.o. daily at bedtime   Side effects and precautions discussed with him.     4)  Weight/Diet:  Body mass index is 26.26 kg/m.  -   he is not a candidate for weight loss. Exercise, and detailed carbohydrates information provided  -  detailed on discharge instructions.  5) Chronic Care/Health Maintenance:  -he  is on  Statin medications and  is encouraged to initiate and continue to follow up with Ophthalmology, Dentist,  Podiatrist at least yearly or according to recommendations, and advised to   stay away from smoking. I have recommended yearly flu vaccine and pneumonia vaccine at least every 5 years; moderate intensity exercise for up to 150 minutes weekly; and  sleep for at least 7 hours a day.  6) vitamin D deficiency: He is being initiated on vitamin D3 5000 units daily for the next 6 months.  His screening ABI was normal in September 2021.  He has left lower extremity diabetic foot ulcer, under care with podiatry.    His next study is due in September 2027, or sooner if needed.  - he is  advised to maintain close follow up with his pcp for primary care needs, as well as his other providers for optimal and coordinated care    I spent  26 minutes in the care  of the patient today including review of labs from CMP, Lipids, Thyroid Function, Hematology (current and previous including abstractions from other facilities); face-to-face time discussing  his blood glucose readings/logs, discussing hypoglycemia and hyperglycemia episodes and symptoms, medications doses, his options of short and long term treatment based on the latest standards of care / guidelines;  discussion about incorporating lifestyle medicine;  and documenting the encounter. Risk reduction counseling performed per USPSTF guidelines to reduce  cardiovascular risk factors.     Please refer to Patient Instructions for Blood Glucose Monitoring and Insulin/Medications Dosing Guide"  in media tab for additional information. Please  also refer to " Patient Self Inventory" in the Media  tab for reviewed elements of pertinent patient history.  Chad Kirk participated in the discussions, expressed understanding, and voiced agreement with the above plans.  All questions were answered to his satisfaction. he is encouraged to contact clinic should he have any questions or  concerns prior to his return visit.   Follow up plan: - Return in about 4 months (around 04/23/2023) for Bring Meter/CGM Device/Logs- A1c in Office.  Chad Lunch, MD [facility] [address] [place], [place] [address] Phone: [phone]  Fax: [phone]    12/21/2022, 4:08 PM  This note was partially dictated with voice recognition software. Similar sounding words can be transcribed inadequately or may not  be corrected upon review.

## 2022-12-21 NOTE — Patient Instructions (Signed)

## 2023-01-01 ENCOUNTER — Telehealth: Payer: Self-pay

## 2023-01-01 ENCOUNTER — Telehealth: Payer: Self-pay | Admitting: "Endocrinology

## 2023-01-01 ENCOUNTER — Other Ambulatory Visit (HOSPITAL_COMMUNITY): Payer: Self-pay

## 2023-01-01 ENCOUNTER — Other Ambulatory Visit: Payer: Self-pay | Admitting: Internal Medicine

## 2023-01-01 DIAGNOSIS — I10 Essential (primary) hypertension: Secondary | ICD-10-CM

## 2023-01-01 DIAGNOSIS — N400 Enlarged prostate without lower urinary tract symptoms: Secondary | ICD-10-CM

## 2023-01-01 NOTE — Telephone Encounter (Signed)
Patient Advocate Encounter   Received notification from pt msgs that prior authorization is required for Farxiga 5MG  tablets  Submitted: 01/01/23 Key [id number]  Status is pending

## 2023-01-01 NOTE — Telephone Encounter (Signed)
Pt said he has been waiting on his PA for about 2 weeks. Do you have a status of this?

## 2023-01-03 ENCOUNTER — Ambulatory Visit (INDEPENDENT_AMBULATORY_CARE_PROVIDER_SITE_OTHER): Payer: 59 | Admitting: Internal Medicine

## 2023-01-03 ENCOUNTER — Encounter: Payer: Self-pay | Admitting: Internal Medicine

## 2023-01-03 VITALS — BP 136/88 | HR 84 | Ht 70.0 in | Wt 183.2 lb

## 2023-01-03 DIAGNOSIS — I10 Essential (primary) hypertension: Secondary | ICD-10-CM | POA: Diagnosis not present

## 2023-01-03 DIAGNOSIS — Z0001 Encounter for general adult medical examination with abnormal findings: Secondary | ICD-10-CM | POA: Diagnosis not present

## 2023-01-03 DIAGNOSIS — E113522 Type 2 diabetes mellitus with proliferative diabetic retinopathy with traction retinal detachment involving the macula, left eye: Secondary | ICD-10-CM

## 2023-01-03 DIAGNOSIS — E113592 Type 2 diabetes mellitus with proliferative diabetic retinopathy without macular edema, left eye: Secondary | ICD-10-CM | POA: Diagnosis not present

## 2023-01-03 DIAGNOSIS — Z794 Long term (current) use of insulin: Secondary | ICD-10-CM

## 2023-01-03 NOTE — Assessment & Plan Note (Signed)
Followed by Ophthalmology.

## 2023-01-03 NOTE — Assessment & Plan Note (Signed)
Physical exam as documented. Counseling done  re healthy lifestyle involving commitment to 150 minutes exercise per week, heart healthy diet, and attaining healthy weight.The importance of adequate sleep also discussed. Changes in health habits are decided on by the patient with goals and time frames  set for achieving them. Immunization and cancer screening needs are specifically addressed at this visit.  Fasting blood tests reviewed and discussed with the patient.

## 2023-01-03 NOTE — Progress Notes (Signed)
Established Patient Office Visit  Subjective:  Patient ID: Chad Kirk, male    DOB: 10-23-1960  Age: 62 y.o. MRN: [id number]  CC:  Chief Complaint  Patient presents with   Annual Exam    HPI Chad Kirk is a 62 y.o. male with past medical history of diabetes mellitus type 2, PVD, hypertension, hyperlipidemia, diabetic macular edema, BPH and alcohol abuse who presents for annual physical.  HTN: BP is wnl today. Takes medications regularly - Losartan 100 mg QD and amlodipine 10 mg QD. Patient denies headache, dizziness, chest pain, dyspnea or palpitations.  DM: He follows up with Dr. [name] for diabetes management.  He takes Lantus 28 U qHS regularly and is planned to start Farxiga 5 mg QD instead of Glipizide 5 mg BID. He is advised to follow diabetic diet as instructed. He follows up with ophthalmologist for diabetic macular edema and his vision has improved now. Denies any polyuria, polyphagia or fatigue.  Past Medical History:  Diagnosis Date   Diabetes mellitus without complication (HCC)    DIAGNOSED AGE 24   Diabetic ulcer of left great toe (HCC) 01/25/2021   Healed diabetic foot ulcer 02/22/2021   Hypertension     Past Surgical History:  Procedure Laterality Date   ESOPHAGEAL DILATION N/A 12/07/2019   Procedure: ESOPHAGEAL OR PYLORIC DILATION;  Surgeon: West Bali, MD;  Location: [facility];  Service: Endoscopy;  Laterality: N/A;   ESOPHAGOGASTRODUODENOSCOPY N/A 12/07/2019   erosive gastritis, many non-bleeding cratered and superficial duodenal ulcers without stigmata of bleeding in duodenal bulb. Empiric dilation due to possible occult esophageal web. Negative H.pylori.    EYE SURGERY  03/02/2020   diabetic retinopathy, lens placement, cataract removal, right eye   HERNIA REPAIR Left 1990    Family History  Problem Relation Age of Onset   Stroke Mother    Hypertension Mother    Diabetes Mother    Hypertension Father    Diabetes Father    Colon cancer Neg Hx     Colon polyps Neg Hx     Social History   Socioeconomic History   Marital status: Married    Spouse name: Not on file   Number of children: Not on file   Years of education: Not on file   Highest education level: Not on file  Occupational History   Not on file  Tobacco Use   Smoking status: Former    Packs/day: 1.00    Years: 5.00    Additional pack years: 0.00    Total pack years: 5.00    Types: Cigarettes    Quit date: 08/20/1988    Years since quitting: 34.3   Smokeless tobacco: Never  Vaping Use   Vaping Use: Never used  Substance and Sexual Activity   Alcohol use: Not Currently    Comment: denied 03/15/20   Drug use: No   Sexual activity: Not on file  Other Topics Concern   Not on file  Social History Narrative   Not on file   Social Determinants of Health   Financial Resource Strain: Not on file  Food Insecurity: Not on file  Transportation Needs: Not on file  Physical Activity: Not on file  Stress: Not on file  Social Connections: Not on file  Intimate Partner Violence: Not on file    Outpatient Medications Prior to Visit  Medication Sig Dispense Refill   amLODipine (NORVASC) 10 MG tablet Take 1 tablet (10 mg total) by mouth daily. 90 tablet 3  benzonatate (TESSALON) 200 MG capsule Take 1 capsule (200 mg total) by mouth 2 (two) times daily as needed for cough. 30 capsule 1   blood glucose meter kit and supplies KIT 1 each by Does not apply route 4 (four) times daily. Dispense based on patient and insurance preference. Use up to four times daily as directed. 1 each 0   Blood Glucose Monitoring Suppl (ACCU-CHEK GUIDE ME) w/Device KIT 1 Piece by Does not apply route as directed. 1 kit 0   Carboxymethylcellulose Sodium (LUBRICANT EYE DROPS OP) Apply 1 Container to eye as needed (dry eye). Left eye     Cholecalciferol (VITAMIN D3) 125 MCG (5000 UT) CAPS Take 1 capsule (5,000 Units total) by mouth daily. 90 capsule 1   Continuous Blood Gluc Receiver (FREESTYLE  LIBRE 2 READER) DEVI As directed 1 each 0   Continuous Glucose Sensor (FREESTYLE LIBRE 2 SENSOR) MISC Change sensor every 14 days 2 each 3   dapagliflozin propanediol (FARXIGA) 5 MG TABS tablet Take 1 tablet (5 mg total) by mouth daily before breakfast. 90 tablet 1   dorzolamide (TRUSOPT) 2 % ophthalmic solution Place 1 drop into the left eye 3 (three) times daily.     finasteride (PROSCAR) 5 MG tablet Take 1 tablet by mouth once daily 90 tablet 0   gentamicin cream (GARAMYCIN) 0.1 % Apply to left heel once daily until healed. 30 g 0   glucose blood (ONETOUCH VERIO) test strip USE 1 STRIP TO CHECK GLUCOSE UP TO 2 TIMES DAILY 100 each 2   Insulin Pen Needle (B-D ULTRAFINE III SHORT PEN) 31G X 8 MM MISC 1 each by Does not apply route as directed. 100 each 3   Lancets (ONETOUCH DELICA PLUS LANCET33G) MISC Apply topically.     LANTUS SOLOSTAR 100 UNIT/ML Solostar Pen Inject 26 Units into the skin at bedtime. 15 mL 1   losartan (COZAAR) 100 MG tablet TAKE 1 TABLET BY MOUTH ONCE DAILY - DOSE CHANGE 90 tablet 0   Multiple Vitamins-Minerals (QC DAILY MULTIVIT/MULTIMINERAL PO) Take 1 tablet by mouth daily.     prednisoLONE acetate (PRED FORTE) 1 % ophthalmic suspension 1 drop 4 (four) times daily.     Probiotic Product (PROBIOTIC PO) Take 1 tablet by mouth daily.     rosuvastatin (CRESTOR) 10 MG tablet Take 1 tablet (10 mg total) by mouth daily. 90 tablet 0   senna-docusate (SENOKOT-S) 8.6-50 MG tablet Take 1 tablet by mouth as needed.      sildenafil (VIAGRA) 100 MG tablet TAKE 1 TABLET BY MOUTH ONCE DAILY AS NEEDED FOR ERECTILE DYSFUNCTION 30 tablet 0   tamsulosin (FLOMAX) 0.4 MG CAPS capsule Take 1 capsule (0.4 mg total) by mouth daily. 90 capsule 3   timolol (TIMOPTIC) 0.5 % ophthalmic solution Place 1 drop into both eyes 3 times daily.     UNABLE TO FIND Take 5 mLs by mouth every 4 (four) hours as needed. Med Name: [facility]othe[facility] Cough Syrup Take 5 mL by mouth every 4 to 6 hours as needed for cough 120  mL 0   FUROSEMIDE PO Take by mouth daily.     No facility-administered medications prior to visit.    No Known Allergies  ROS Review of Systems  Constitutional:  Negative for chills and fever.  HENT:  Negative for congestion and sore throat.   Eyes:  Positive for visual disturbance. Negative for pain and discharge.  Respiratory:  Negative for cough and shortness of breath.   Cardiovascular:  Negative  for chest pain and palpitations.  Gastrointestinal:  Negative for constipation, diarrhea, nausea and vomiting.  Endocrine: Negative for polydipsia and polyuria.  Genitourinary:  Negative for dysuria and hematuria.  Musculoskeletal:  Negative for neck pain and neck stiffness.  Skin:  Negative for rash.  Neurological:  Negative for dizziness, weakness, numbness and headaches.  Psychiatric/Behavioral:  Negative for agitation and behavioral problems.       Objective:    Physical Exam Vitals reviewed.  Constitutional:      General: He is not in acute distress.    Appearance: He is not diaphoretic.  HENT:     Head: Normocephalic and atraumatic.     Nose: Nose normal.     Mouth/Throat:     Mouth: Mucous membranes are moist.  Eyes:     General: No scleral icterus.    Extraocular Movements: Extraocular movements intact.  Cardiovascular:     Rate and Rhythm: Normal rate and regular rhythm.     Heart sounds: Normal heart sounds. No murmur heard.    Comments: DPA pulse 1+ b/l Pulmonary:     Breath sounds: Normal breath sounds. No wheezing or rales.  Abdominal:     Palpations: Abdomen is soft.     Tenderness: There is no abdominal tenderness.  Musculoskeletal:     Cervical back: Neck supple. No tenderness.     Right lower leg: No edema.     Left lower leg: No edema.  Skin:    General: Skin is warm.     Findings: No rash.     Comments: No ulcer or skin breakdown on feet  Neurological:     General: No focal deficit present.     Mental Status: He is alert and oriented to  person, place, and time.     Cranial Nerves: No cranial nerve deficit.     Sensory: No sensory deficit.     Motor: No weakness.  Psychiatric:        Mood and Affect: Mood normal.        Behavior: Behavior normal.     BP 136/88 (BP Location: Left Arm)   Pulse 84   Ht 5\' 10"  (1.778 m)   Wt 183 lb 3.2 oz (83.1 kg)   SpO2 99%   BMI 26.29 kg/m  Wt Readings from Last 3 Encounters:  01/03/23 183 lb 3.2 oz (83.1 kg)  12/21/22 183 lb (83 kg)  09/21/22 180 lb 6.4 oz (81.8 kg)    Lab Results  Component Value Date   TSH 2.010 12/14/2022   Lab Results  Component Value Date   WBC 6.1 12/12/2021   HGB 14.0 12/12/2021   HCT 42.9 12/12/2021   MCV 81 12/12/2021   PLT 214 12/12/2021   Lab Results  Component Value Date   NA 140 12/14/2022   K 4.4 12/14/2022   CO2 21 12/14/2022   GLUCOSE 79 12/14/2022   BUN 18 12/14/2022   CREATININE 1.28 (H) 12/14/2022   BILITOT 0.4 12/14/2022   ALKPHOS 69 12/14/2022   AST 26 12/14/2022   ALT 38 12/14/2022   PROT 7.2 12/14/2022   ALBUMIN 4.2 12/14/2022   CALCIUM 9.6 12/14/2022   ANIONGAP 8 12/30/2019   EGFR 63 12/14/2022   Lab Results  Component Value Date   CHOL 117 12/14/2022   Lab Results  Component Value Date   HDL 38 (L) 12/14/2022   Lab Results  Component Value Date   LDLCALC 64 12/14/2022   Lab Results  Component Value Date  TRIG 75 12/14/2022   Lab Results  Component Value Date   CHOLHDL 3.1 12/14/2022   Lab Results  Component Value Date   HGBA1C 8.3 (A) 12/21/2022      Assessment & Plan:   Problem List Items Addressed This Visit       Cardiovascular and Mediastinum   Essential hypertension, benign    BP Readings from Last 1 Encounters:  01/03/23 136/88  Well-controlled with Losartan 100 mg daily QD and amlodipine 10 mg QD Used to take Lasix PRN for leg swelling, now improved Counseled for compliance with the medications Advised DASH diet and moderate exercise/walking, at least 150 mins/week         Endocrine   Type 2 diabetes mellitus with ophthalmic complication (HCC)    Lab Results  Component Value Date   HGBA1C 8.3 (A) 12/21/2022  Uncontrolled On Lantus 28 U qHS and Glipizide 5 mg BID, but planned to start Farxiga 5 mg QD instead of Glipizide F/u with Dr [name] Advised to follow diabetic diet On statin F/u CMP and lipid panel Diabetic eye exam: Advised to continue to follow up with Ophthalmology for diabetic eye exam      Relevant Orders   Urine Microalbumin w/creat. ratio   Left eye affected by proliferative diabetic retinopathy with traction retinal detachment involving macula, associated with type 2 diabetes mellitus (HCC)    Followed by Ophthalmology        Other   Encounter for general adult medical examination with abnormal findings - Primary    Physical exam as documented. Counseling done  re healthy lifestyle involving commitment to 150 minutes exercise per week, heart healthy diet, and attaining healthy weight.The importance of adequate sleep also discussed. Changes in health habits are decided on by the patient with goals and time frames  set for achieving them. Immunization and cancer screening needs are specifically addressed at this visit.  Fasting blood tests reviewed and discussed with the patient.       No orders of the defined types were placed in this encounter.   Follow-up: Return in about 4 months (around 05/06/2023) for HTN and DM.    Anabel Halon, MD

## 2023-01-03 NOTE — Assessment & Plan Note (Addendum)
BP Readings from Last 1 Encounters:  01/03/23 136/88   Well-controlled with Losartan 100 mg daily QD and amlodipine 10 mg QD Used to take Lasix PRN for leg swelling, now improved Counseled for compliance with the medications Advised DASH diet and moderate exercise/walking, at least 150 mins/week

## 2023-01-03 NOTE — Patient Instructions (Signed)
Please continue to take medications as prescribed. ? ?Please continue to follow low carb diet and perform moderate exercise/walking at least 150 mins/week. ?

## 2023-01-03 NOTE — Assessment & Plan Note (Addendum)
Lab Results  Component Value Date   HGBA1C 8.3 (A) 12/21/2022   Uncontrolled On Lantus 28 U qHS and Glipizide 5 mg BID, but planned to start Farxiga 5 mg QD instead of Glipizide F/u with Dr [name] Advised to follow diabetic diet On statin F/u CMP and lipid panel Diabetic eye exam: Advised to continue to follow up with Ophthalmology for diabetic eye exam

## 2023-01-05 LAB — MICROALBUMIN / CREATININE URINE RATIO
Creatinine, Urine: 36.6 mg/dL
Microalb/Creat Ratio: 325 mg/g creat — ABNORMAL HIGH (ref 0–29)
Microalbumin, Urine: 119 ug/mL

## 2023-01-18 NOTE — Telephone Encounter (Signed)
PA has been DENIED due to:  This request was denied because you did not meet the following requirements: The requested medication is not covered because it is not on the listing or formulary of approved drugs for your plan benefit. Please discuss alternative drug therapy with your doctor. The request for coverage for FARXIGA TAB 5MG , use as directed (30 per month), is denied. This decision is based on health plan criteria for FARXIGA TAB 5MG . This medicine is covered only if:  You have a history of therapeutic failure after a three month trial, contraindication or intolerance to [place]. The information provided does not show that you meet the criteria listed above

## 2023-01-21 ENCOUNTER — Other Ambulatory Visit: Payer: Self-pay

## 2023-01-21 ENCOUNTER — Other Ambulatory Visit: Payer: Self-pay | Admitting: Internal Medicine

## 2023-01-21 DIAGNOSIS — E1165 Type 2 diabetes mellitus with hyperglycemia: Secondary | ICD-10-CM

## 2023-01-21 DIAGNOSIS — R059 Cough, unspecified: Secondary | ICD-10-CM

## 2023-01-21 MED ORDER — BENZONATATE 200 MG PO CAPS
200.0000 mg | ORAL_CAPSULE | Freq: Two times a day (BID) | ORAL | 1 refills | Status: DC | PRN
Start: 2023-01-21 — End: 2024-01-15

## 2023-01-21 NOTE — Telephone Encounter (Signed)
PA is showing denied, pt has called with questions about if there is something else that can be called instead.

## 2023-01-30 ENCOUNTER — Ambulatory Visit (INDEPENDENT_AMBULATORY_CARE_PROVIDER_SITE_OTHER): Payer: 59 | Admitting: Podiatry

## 2023-01-30 ENCOUNTER — Encounter: Payer: Self-pay | Admitting: Podiatry

## 2023-01-30 VITALS — BP 140/90 | HR 77

## 2023-01-30 DIAGNOSIS — B351 Tinea unguium: Secondary | ICD-10-CM

## 2023-01-30 DIAGNOSIS — E1142 Type 2 diabetes mellitus with diabetic polyneuropathy: Secondary | ICD-10-CM

## 2023-01-30 DIAGNOSIS — I739 Peripheral vascular disease, unspecified: Secondary | ICD-10-CM | POA: Diagnosis not present

## 2023-01-30 DIAGNOSIS — M79675 Pain in left toe(s): Secondary | ICD-10-CM

## 2023-01-30 DIAGNOSIS — Z794 Long term (current) use of insulin: Secondary | ICD-10-CM | POA: Diagnosis not present

## 2023-01-30 DIAGNOSIS — M79674 Pain in right toe(s): Secondary | ICD-10-CM

## 2023-01-30 DIAGNOSIS — L84 Corns and callosities: Secondary | ICD-10-CM

## 2023-01-30 NOTE — Progress Notes (Signed)
  Subjective:  Patient ID: Chad Kirk, male    DOB: Jun 01, 1961,  MRN: [id number]  Ken Cambray presents to clinic today for at risk foot care. Pt has h/o NIDDM with PAD and preulcerative lesion(s) left lower extremity and painful mycotic toenails that limit ambulation. Painful toenails interfere with ambulation. Aggravating factors include wearing enclosed shoe gear. Pain is relieved with periodic professional debridement. Painful preulcerative lesion(s) is/are aggravated when weightbearing with and without shoegear. Pain is relieved with periodic professional debridement.  Chief Complaint  Patient presents with   Diabetes    [facility] BS - 130 A1C - 8 LVPCP - 12/2022   New problem(s): None.   PCP is Anabel Halon, MD.  No Known Allergies  Review of Systems: Negative except as noted in the HPI.  Objective: No changes noted in today's physical examination. Vitals:   01/30/23 0837  BP: (!) 140/90  Pulse: 77  SpO2: 97%   Kacen Fossen is a pleasant 62 y.o. male WD, WN in NAD. AAO x 3.  Vascular Examination: CFT <4 seconds b/l. DP pulses faintly palpable b/l. PT pulses nonpalpable b/l. Digital hair absent. Skin temperature gradient warm to warm b/l. No pain with calf compression. No ischemia or gangrene. No cyanosis or clubbing noted b/l. No edema noted b/l LE.   Neurological Examination: Protective sensation diminished with 10g monofilament b/l.  Dermatological Examination: Pedal skin warm and supple b/l. No open wounds b/l. No interdigital macerations. Toenails 1-5 b/l thick, discolored, elongated with subungual debris and pain on dorsal palpation.  Hyperkeratotic lesion(s) distal tip of left great toe.  No erythema, no edema, no drainage, no fluctuance.  Musculoskeletal Examination: Normal muscle strength 5/5 to all lower extremity muscle groups bilaterally. HAV with bunion deformity noted b/l LE. Hammertoe deformity noted 2-5 b/l.Marland Kitchen No pain, crepitus or joint limitation noted with ROM  b/l LE.  Patient ambulates independently without assistive aids.  Radiographs: None  Last HgA1c:      Latest Ref Rng & Units 12/21/2022    9:16 AM 09/21/2022    9:31 AM 05/21/2022   10:12 AM  Hemoglobin A1C  Hemoglobin-A1c 0.0 - 7.0 % 8.3  8.7  8.2    Assessment/Plan: 1. Pain due to onychomycosis of toenails of both feet   2. Pre-ulcerative calluses   3. PAD (peripheral artery disease) (HCC)   4. Type 2 diabetes mellitus with diabetic polyneuropathy, with long-term current use of insulin (HCC)     -Consent given for treatment as described below: -Examined patient. -Continue foot and shoe inspections daily. Monitor blood glucose per PCP/Endocrinologist's recommendations. -Toenails 1-5 b/l were debrided in length and girth with sterile nail nippers and dremel without iatrogenic bleeding.  -Preulcerative lesion pared left great toe utilizing sterile scalpel blade. Total number pared=1. -Patient/POA to call should there be question/concern in the interim.   Return in about 9 weeks (around 04/03/2023).  Freddie Breech, DPM

## 2023-02-05 ENCOUNTER — Ambulatory Visit: Payer: 59 | Admitting: Urology

## 2023-02-05 DIAGNOSIS — R972 Elevated prostate specific antigen [PSA]: Secondary | ICD-10-CM

## 2023-02-05 DIAGNOSIS — N400 Enlarged prostate without lower urinary tract symptoms: Secondary | ICD-10-CM

## 2023-02-08 ENCOUNTER — Telehealth: Payer: Self-pay | Admitting: "Endocrinology

## 2023-02-08 ENCOUNTER — Other Ambulatory Visit: Payer: Self-pay | Admitting: Internal Medicine

## 2023-02-08 DIAGNOSIS — N529 Male erectile dysfunction, unspecified: Secondary | ICD-10-CM

## 2023-02-08 NOTE — Telephone Encounter (Signed)
Pt states he needs a new transmitter prescription for [place] 2 sent into [facility] in [place].

## 2023-02-11 NOTE — Telephone Encounter (Signed)
Left a message requesting pt return call to the office. 

## 2023-02-12 ENCOUNTER — Other Ambulatory Visit: Payer: Self-pay

## 2023-02-12 MED ORDER — FREESTYLE LIBRE 2 READER DEVI: 0 refills | Status: DC

## 2023-02-12 NOTE — Telephone Encounter (Signed)
Spoke with pt, he stated he received a coupon for a Libre reader and was able to go ahead and pick the reader up from the pharmacy.

## 2023-03-08 ENCOUNTER — Other Ambulatory Visit: Payer: Self-pay | Admitting: "Endocrinology

## 2023-03-08 ENCOUNTER — Other Ambulatory Visit: Payer: Self-pay | Admitting: Internal Medicine

## 2023-03-08 DIAGNOSIS — N529 Male erectile dysfunction, unspecified: Secondary | ICD-10-CM

## 2023-03-08 DIAGNOSIS — I10 Essential (primary) hypertension: Secondary | ICD-10-CM

## 2023-03-12 ENCOUNTER — Ambulatory Visit: Payer: 59 | Admitting: Urology

## 2023-03-26 ENCOUNTER — Other Ambulatory Visit (HOSPITAL_COMMUNITY): Payer: Self-pay | Admitting: Nephrology

## 2023-03-26 DIAGNOSIS — E1122 Type 2 diabetes mellitus with diabetic chronic kidney disease: Secondary | ICD-10-CM

## 2023-03-26 DIAGNOSIS — I129 Hypertensive chronic kidney disease with stage 1 through stage 4 chronic kidney disease, or unspecified chronic kidney disease: Secondary | ICD-10-CM

## 2023-03-26 DIAGNOSIS — I739 Peripheral vascular disease, unspecified: Secondary | ICD-10-CM

## 2023-04-04 ENCOUNTER — Ambulatory Visit (HOSPITAL_COMMUNITY): Payer: 59

## 2023-04-04 ENCOUNTER — Ambulatory Visit: Payer: 59 | Admitting: Podiatry

## 2023-04-10 ENCOUNTER — Other Ambulatory Visit: Payer: Self-pay | Admitting: "Endocrinology

## 2023-04-10 ENCOUNTER — Other Ambulatory Visit: Payer: Self-pay | Admitting: Internal Medicine

## 2023-04-10 ENCOUNTER — Other Ambulatory Visit: Payer: Self-pay

## 2023-04-10 ENCOUNTER — Telehealth: Payer: Self-pay | Admitting: Internal Medicine

## 2023-04-10 DIAGNOSIS — I10 Essential (primary) hypertension: Secondary | ICD-10-CM

## 2023-04-10 DIAGNOSIS — N529 Male erectile dysfunction, unspecified: Secondary | ICD-10-CM

## 2023-04-10 DIAGNOSIS — E113592 Type 2 diabetes mellitus with proliferative diabetic retinopathy without macular edema, left eye: Secondary | ICD-10-CM

## 2023-04-10 DIAGNOSIS — N400 Enlarged prostate without lower urinary tract symptoms: Secondary | ICD-10-CM

## 2023-04-10 MED ORDER — SILDENAFIL CITRATE 100 MG PO TABS: 100.0000 mg | ORAL_TABLET | Freq: Every day | ORAL | 0 refills | Status: DC | PRN

## 2023-04-10 NOTE — Telephone Encounter (Signed)
Patient called left a voicemail asked for his nurse to return his call has questions. Call back # [phone].

## 2023-04-10 NOTE — Telephone Encounter (Signed)
Spoke to patient

## 2023-04-11 ENCOUNTER — Encounter: Payer: Self-pay | Admitting: Podiatry

## 2023-04-11 ENCOUNTER — Ambulatory Visit (HOSPITAL_COMMUNITY)
Admission: RE | Admit: 2023-04-11 | Discharge: 2023-04-11 | Disposition: A | Discharge status: Home / Self Care | Payer: 59 | Source: Ambulatory Visit | Attending: Nephrology | Admitting: Nephrology

## 2023-04-11 ENCOUNTER — Ambulatory Visit (INDEPENDENT_AMBULATORY_CARE_PROVIDER_SITE_OTHER): Payer: 59 | Admitting: Podiatry

## 2023-04-11 DIAGNOSIS — I739 Peripheral vascular disease, unspecified: Secondary | ICD-10-CM

## 2023-04-11 DIAGNOSIS — L84 Corns and callosities: Secondary | ICD-10-CM

## 2023-04-11 DIAGNOSIS — E1142 Type 2 diabetes mellitus with diabetic polyneuropathy: Secondary | ICD-10-CM

## 2023-04-11 DIAGNOSIS — I129 Hypertensive chronic kidney disease with stage 1 through stage 4 chronic kidney disease, or unspecified chronic kidney disease: Secondary | ICD-10-CM | POA: Insufficient documentation

## 2023-04-11 DIAGNOSIS — M79675 Pain in left toe(s): Secondary | ICD-10-CM

## 2023-04-11 DIAGNOSIS — L97911 Non-pressure chronic ulcer of unspecified part of right lower leg limited to breakdown of skin: Secondary | ICD-10-CM

## 2023-04-11 DIAGNOSIS — M79674 Pain in right toe(s): Secondary | ICD-10-CM

## 2023-04-11 DIAGNOSIS — Z794 Long term (current) use of insulin: Secondary | ICD-10-CM

## 2023-04-11 DIAGNOSIS — B351 Tinea unguium: Secondary | ICD-10-CM

## 2023-04-11 DIAGNOSIS — E1122 Type 2 diabetes mellitus with diabetic chronic kidney disease: Secondary | ICD-10-CM | POA: Diagnosis present

## 2023-04-11 MED ORDER — MUPIROCIN 2 % EX OINT: 1.0000 | TOPICAL_OINTMENT | Freq: Every day | CUTANEOUS | 0 refills | Status: DC
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
## 2023-04-24 ENCOUNTER — Other Ambulatory Visit: Payer: Self-pay | Admitting: "Endocrinology

## 2023-04-26 ENCOUNTER — Ambulatory Visit (INDEPENDENT_AMBULATORY_CARE_PROVIDER_SITE_OTHER): Payer: 59 | Admitting: "Endocrinology

## 2023-04-26 ENCOUNTER — Encounter: Payer: Self-pay | Admitting: "Endocrinology

## 2023-04-26 VITALS — BP 118/80 | HR 80 | Ht 70.0 in | Wt 176.8 lb

## 2023-04-26 DIAGNOSIS — E782 Mixed hyperlipidemia: Secondary | ICD-10-CM

## 2023-04-26 DIAGNOSIS — E559 Vitamin D deficiency, unspecified: Secondary | ICD-10-CM

## 2023-04-26 DIAGNOSIS — Z794 Long term (current) use of insulin: Secondary | ICD-10-CM | POA: Insufficient documentation

## 2023-04-26 DIAGNOSIS — E113592 Type 2 diabetes mellitus with proliferative diabetic retinopathy without macular edema, left eye: Secondary | ICD-10-CM | POA: Diagnosis not present

## 2023-04-26 DIAGNOSIS — I10 Essential (primary) hypertension: Secondary | ICD-10-CM

## 2023-04-26 LAB — POCT GLYCOSYLATED HEMOGLOBIN (HGB A1C): HbA1c, POC (controlled diabetic range): 8.8 % — AB (ref 0.0–7.0)

## 2023-04-26 MED ORDER — GLIPIZIDE ER 5 MG PO TB24: 5.0000 mg | ORAL_TABLET | Freq: Every day | ORAL | 1 refills | Status: DC

## 2023-04-26 MED ORDER — LANTUS SOLOSTAR 100 UNIT/ML ~~LOC~~ SOPN: 20.0000 [IU] | PEN_INJECTOR | Freq: Every day | SUBCUTANEOUS | 1 refills | Status: DC

## 2023-04-26 NOTE — Progress Notes (Signed)
04/26/2023, 10:34 AM  Endocrinology follow-up note   Subjective:    Patient ID: Chad Kirk, male    DOB: 11-Nov-1960.  Chad Kirk is being seen in follow-up after he was seen in consultation for management of currently uncontrolled symptomatic diabetes requested by  Anabel Halon, MD.   Past Medical History:  Diagnosis Date   Diabetes mellitus without complication (HCC)    DIAGNOSED AGE 62   Diabetic ulcer of left great toe (HCC) 01/25/2021   Healed diabetic foot ulcer 02/22/2021   Hypertension     Past Surgical History:  Procedure Laterality Date   ESOPHAGEAL DILATION N/A 12/07/2019   Procedure: ESOPHAGEAL OR PYLORIC DILATION;  Surgeon: West Bali, MD;  Location: [facility];  Service: Endoscopy;  Laterality: N/A;   ESOPHAGOGASTRODUODENOSCOPY N/A 12/07/2019   erosive gastritis, many non-bleeding cratered and superficial duodenal ulcers without stigmata of bleeding in duodenal bulb. Empiric dilation due to possible occult esophageal web. Negative H.pylori.    EYE SURGERY  03/02/2020   diabetic retinopathy, lens placement, cataract removal, right eye   HERNIA REPAIR Left 1990    Social History   Socioeconomic History   Marital status: Married    Spouse name: Not on file   Number of children: Not on file   Years of education: Not on file   Highest education level: Not on file  Occupational History   Not on file  Tobacco Use   Smoking status: Former    Current packs/day: 0.00    Average packs/day: 1 pack/day for 5.0 years (5.0 ttl pk-yrs)    Types: Cigarettes    Start date: 08/21/1983    Quit date: 08/20/1988    Years since quitting: 34.7   Smokeless tobacco: Never  Vaping Use   Vaping status: Never Used  Substance and Sexual Activity   Alcohol use: Not Currently    Comment: denied 03/15/20   Drug use: No   Sexual activity: Not on file  Other Topics Concern   Not on file   Social History Narrative   Not on file   Social Determinants of Health   Financial Resource Strain: Not on file  Food Insecurity: Not on file  Transportation Needs: Not on file  Physical Activity: Not on file  Stress: Not on file  Social Connections: Not on file    Family History  Problem Relation Age of Onset   Stroke Mother    Hypertension Mother    Diabetes Mother    Hypertension Father    Diabetes Father    Colon cancer Neg Hx    Colon polyps Neg Hx     Outpatient Encounter Medications as of 04/26/2023  Medication Sig   glipiZIDE (GLUCOTROL XL) 5 MG 24 hr tablet Take 1 tablet (5 mg total) by mouth daily with breakfast.   amLODipine (NORVASC) 10 MG tablet Take 1 tablet by mouth once daily   benzonatate (TESSALON) 200 MG capsule Take 1 capsule (200 mg total) by mouth 2 (two) times daily as needed for cough.   blood glucose meter kit and supplies KIT 1 each by Does not  apply route 4 (four) times daily. Dispense based on patient and insurance preference. Use up to four times daily as directed.   Blood Glucose Monitoring Suppl (ACCU-CHEK GUIDE ME) w/Device KIT 1 Piece by Does not apply route as directed.   Carboxymethylcellulose Sodium (LUBRICANT EYE DROPS OP) Apply 1 Container to eye as needed (dry eye). Left eye   Cholecalciferol (VITAMIN D3) 125 MCG (5000 UT) CAPS Take 1 capsule (5,000 Units total) by mouth daily.   Continuous Glucose Receiver (FREESTYLE LIBRE 2 READER) DEVI Use to test BG as directed. E11.65   Continuous Glucose Sensor (FREESTYLE LIBRE 2 SENSOR) MISC CHANGE SENSOR EVERY 14 DAYS   dorzolamide (TRUSOPT) 2 % ophthalmic solution Place 1 drop into the left eye 3 (three) times daily.   finasteride (PROSCAR) 5 MG tablet Take 1 tablet by mouth once daily   gentamicin cream (GARAMYCIN) 0.1 % Apply to left heel once daily until healed.   glucose blood (ONETOUCH VERIO) test strip USE 1 STRIP TO CHECK GLUCOSE UP TO 2 TIMES DAILY   Insulin Pen Needle (B-D ULTRAFINE III  SHORT PEN) 31G X 8 MM MISC 1 each by Does not apply route as directed.   Lancets (ONETOUCH DELICA PLUS LANCET33G) MISC Apply topically.   LANTUS SOLOSTAR 100 UNIT/ML Solostar Pen Inject 20 Units into the skin at bedtime.   losartan (COZAAR) 100 MG tablet Take 1 tablet by mouth once daily   Multiple Vitamins-Minerals (QC DAILY MULTIVIT/MULTIMINERAL PO) Take 1 tablet by mouth daily.   mupirocin ointment (BACTROBAN) 2 % Apply 1 Application topically daily.   prednisoLONE acetate (PRED FORTE) 1 % ophthalmic suspension 1 drop 4 (four) times daily.   Probiotic Product (PROBIOTIC PO) Take 1 tablet by mouth daily.   rosuvastatin (CRESTOR) 10 MG tablet Take 1 tablet (10 mg total) by mouth daily.   senna-docusate (SENOKOT-S) 8.6-50 MG tablet Take 1 tablet by mouth as needed.    sildenafil (VIAGRA) 100 MG tablet Take 1 tablet (100 mg total) by mouth daily as needed for erectile dysfunction.   tamsulosin (FLOMAX) 0.4 MG CAPS capsule Take 1 capsule (0.4 mg total) by mouth daily.   timolol (TIMOPTIC) 0.5 % ophthalmic solution Place 1 drop into both eyes 3 times daily.   UNABLE TO FIND Take 5 mLs by mouth every 4 (four) hours as needed. Med Name: Apothecary Cough Syrup Take 5 mL by mouth every 4 to 6 hours as needed for cough   [DISCONTINUED] dapagliflozin propanediol (FARXIGA) 5 MG TABS tablet Take 1 tablet (5 mg total) by mouth daily before breakfast. (Patient not taking: Reported on 04/26/2023)   [DISCONTINUED] LANTUS SOLOSTAR 100 UNIT/ML Solostar Pen Inject 26 Units into the skin at bedtime. (Patient taking differently: Inject 14-28 Units into the skin at bedtime.)   No facility-administered encounter medications on file as of 04/26/2023.    ALLERGIES: No Known Allergies  VACCINATION STATUS: Immunization History  Administered Date(s) Administered   Fluad Quad(high Dose 65+) 06/27/2021   Influenza,inj,Quad PF,6+ Mos 05/29/2022   PFIZER(Purple Top)SARS-COV-2 Vaccination 10/17/2019, 11/07/2019    Pneumococcal Conjugate-13 08/08/2021   Pneumococcal Polysaccharide-23 06/30/2020   Tdap 02/03/2012, 08/30/2022   Zoster Recombinant(Shingrix) 10/05/2021, 11/23/2021    Diabetes He presents for his follow-up diabetic visit. He has type 2 diabetes mellitus. Onset time: He was diagnosed at approximate age of 39 years. His disease course has been worsening. Pertinent negatives for hypoglycemia include no confusion, headaches, nervousness/anxiousness, pallor, seizures, sweats or tremors. Pertinent negatives for diabetes include no blurred vision, no chest  pain, no fatigue, no polydipsia, no polyphagia, no polyuria and no weakness. There are no hypoglycemic complications. Symptoms are worsening. Diabetic complications include impotence, nephropathy, peripheral neuropathy, PVD and retinopathy. (He is reporting legal blindness from diabetic retinopathy on both eyes.) Risk factors for coronary artery disease include dyslipidemia, diabetes mellitus, family history, male sex, hypertension and sedentary lifestyle. Current diabetic treatments: He is currently on Lantus 15-20 units 1-2 times a day. He is compliant with treatment some of the time. His weight is fluctuating minimally. He is following a generally unhealthy diet. When asked about meal planning, he reported none. His home blood glucose trend is increasing steadily. His breakfast blood glucose range is generally 180-200 mg/dl. His lunch blood glucose range is generally 180-200 mg/dl. His dinner blood glucose range is generally 180-200 mg/dl. His bedtime blood glucose range is generally 180-200 mg/dl. His overall blood glucose range is 180-200 mg/dl. (Chad Kirk presents with his CGM device showing average blood glucose between 188-202 for the last 30 days.  His CGM analysis shows 45% time in range, 53% level 1 hyperglycemia, 2% level 2 hyperglycemia.  He did not document any significant hypoglycemia.  His point-of-care A1c is 8.8% increasing from 8.3%.   ) He  does not see a podiatrist.Eye exam is current.  Hyperlipidemia This is a chronic problem. The current episode started more than 1 year ago. The problem is uncontrolled. Recent lipid tests were reviewed and are high. Exacerbating diseases include chronic renal disease and diabetes. Pertinent negatives include no chest pain, myalgias or shortness of breath. Current antihyperlipidemic treatment includes statins. Risk factors for coronary artery disease include diabetes mellitus, dyslipidemia, hypertension, male sex, a sedentary lifestyle and family history.  Hypertension This is a chronic problem. The current episode started more than 1 year ago. The problem is controlled. Pertinent negatives include no blurred vision, chest pain, headaches, neck pain, palpitations, shortness of breath or sweats. Risk factors for coronary artery disease include dyslipidemia, family history, diabetes mellitus, male gender and sedentary lifestyle. Past treatments include calcium channel blockers. Hypertensive end-organ damage includes kidney disease, PVD and retinopathy. Identifiable causes of hypertension include chronic renal disease.       Objective:       04/26/2023    8:25 AM 01/30/2023    8:37 AM 01/03/2023   10:19 AM  Vitals with BMI  Height 5\' 10"     Weight 176 lbs 13 oz    BMI 25.37    Systolic 118 140 161  Diastolic 80 90 88  Pulse 80 77     BP 118/80   Pulse 80   Ht 5\' 10"  (1.778 m)   Wt 176 lb 12.8 oz (80.2 kg)   BMI 25.37 kg/m   Wt Readings from Last 3 Encounters:  04/26/23 176 lb 12.8 oz (80.2 kg)  01/03/23 183 lb 3.2 oz (83.1 kg)  12/21/22 183 lb (83 kg)       CMP ( most recent) CMP     Component Value Date/Time   NA 140 12/14/2022 0841   K 4.4 12/14/2022 0841   CL 105 12/14/2022 0841   CO2 21 12/14/2022 0841   GLUCOSE 79 12/14/2022 0841   GLUCOSE 256 (H) 12/30/2019 1218   BUN 18 12/14/2022 0841   CREATININE 1.28 (H) 12/14/2022 0841   CALCIUM 9.6 12/14/2022 0841   PROT 7.2  12/14/2022 0841   ALBUMIN 4.2 12/14/2022 0841   AST 26 12/14/2022 0841   ALT 38 12/14/2022 0841   ALKPHOS 69 12/14/2022 0841  BILITOT 0.4 12/14/2022 0841   GFRNONAA >60 12/30/2019 1218   GFRAA >60 12/30/2019 1218     Diabetic Labs (most recent): Lab Results  Component Value Date   HGBA1C 8.8 (A) 04/26/2023   HGBA1C 8.3 (A) 12/21/2022   HGBA1C 8.7 (A) 09/21/2022     Lipid Panel ( most recent) Lipid Panel     Component Value Date/Time   CHOL 117 12/14/2022 0841   TRIG 75 12/14/2022 0841   HDL 38 (L) 12/14/2022 0841   CHOLHDL 3.1 12/14/2022 0841   CHOLHDL 4.4 12/30/2019 1218   VLDL 15 12/30/2019 1218   LDLCALC 64 12/14/2022 0841   LABVLDL 15 12/14/2022 0841      Assessment & Plan:   1. Type 2 diabetes, uncontrolled, with retinopathy with macular edema (HCC)  - Chad Kirk has currently uncontrolled symptomatic type 2 DM since  62 years of age.  Chad Kirk presents with his CGM device showing average blood glucose between 188-202 for the last 30 days.  His CGM analysis shows 45% time in range, 53% level 1 hyperglycemia, 2% level 2 hyperglycemia.  He did not document any significant hypoglycemia.  His point-of-care A1c is 8.8% increasing from 8.3%.   - I had a long discussion with him about the progressive nature of diabetes and the pathology behind its complications. -his diabetes is complicated by retinopathy, peripheral arterial disease, peripheral neuropathy and he remains at a high risk for more acute and chronic complications which include CAD, CVA, CKD, retinopathy, and neuropathy. These are all discussed in detail with him.  -He did not engage optimally, however he promises to do better on last admission. - he acknowledges that there is a room for improvement in his food and drink choices. - Suggestion is made for him to avoid simple carbohydrates  from his diet including Cakes, Sweet Desserts, Ice Cream, Soda (diet and regular), Sweet Tea, Candies, Chips, Cookies,  Store Bought Juices, Alcohol , Artificial Sweeteners,  Coffee Creamer, and "Sugar-free" Products, Lemonade. This will help patient to have more stable blood glucose profile and potentially avoid unintended weight gain.  The following Lifestyle Medicine recommendations according to American College of Lifestyle Medicine  [facility]) were discussed and and offered to patient and he  agrees to start the journey:  A. Whole Foods, Plant-Based Nutrition comprising of fruits and vegetables, plant-based proteins, whole-grain carbohydrates was discussed in detail with the patient.   A list for source of those nutrients were also provided to the patient.  Patient will use only water or unsweetened tea for hydration. B.  The need to stay away from risky substances including alcohol, smoking; obtaining 7 to 9 hours of restorative sleep, at least 150 minutes of moderate intensity exercise weekly, the importance of healthy social connections,  and stress management techniques were discussed. C.  A full color page of  Calorie density of various food groups per pound showing examples of each food groups was provided to the patient.  He is following with Norm Salt for [place]. - I have approached him with the following individualized plan to manage  his diabetes and patient agrees:   -In light of his presentation with significantly above target glycemic profile, he is approached for better engagement.  He has a habit of stopping his Lantus altogether when he is he is aware tightening of glycemic profile even though he did not document any hypoglycemia.    -He is advised to adjust his Lantus at 20 units, but consistently every night  associated with utility of his CGM continuously.  He is encouraged to call clinic for hypoglycemia below 70 or hyperglycemia above 200 mg per DL.   -He did not obtain his SGLT2 inhibitors prescription filled for unclear reasons.  He is advised to resume his glipizide 5 mg XL p.o. daily at  breakfast.  -If he returns with uncontrolled glycemic profile, he will be considered for premixed insulin twice a day- Novolog 70/30 or Humalog 75/25  during next visit.   - he is encouraged to call clinic for blood glucose levels less than 70 or above 200 mg /dl.  - he is warned not to take insulin without proper monitoring per orders.    - Specific targets for  A1c;  LDL, HDL,  and Triglycerides were discussed with the patient.  2) Blood Pressure /Hypertension: -His blood pressure is controlled to target. he is advised to continue his current medications including amlodipine 10 mg p.o.. daily with breakfast .  He will be considered for HCTZ/lisinopril next visit.   3) Lipids/Hyperlipidemia:   -His recent lipid panel showed controlled LDL at 64.  He is advised to continue Crestor 10 mg p.o. daily at bedtime.  side effects and precautions discussed with him.     4)  Weight/Diet:  Body mass index is 25.37 kg/m.  -   he is not a candidate for weight loss. Exercise, and detailed carbohydrates information provided  -  detailed on discharge instructions.  5) Chronic Care/Health Maintenance:  -he  is on  Statin medications and  is encouraged to initiate and continue to follow up with Ophthalmology, Dentist,  Podiatrist at least yearly or according to recommendations, and advised to   stay away from smoking. I have recommended yearly flu vaccine and pneumonia vaccine at least every 5 years; moderate intensity exercise for up to 150 minutes weekly; and  sleep for at least 7 hours a day.  6) vitamin D deficiency: He is being initiated on vitamin D3 5000 units daily for the next 6 months.  His screening ABI was normal in September 2021.  He has left lower extremity diabetic foot ulcer, under care with podiatry.    His next study is due in September 2027, or sooner if needed.  - he is  advised to maintain close follow up with his pcp for primary care needs, as well as his other providers for  optimal and coordinated care     I spent  28  minutes in the care of the patient today including review of labs from CMP, Lipids, Thyroid Function, Hematology (current and previous including abstractions from other facilities); face-to-face time discussing  his blood glucose readings/logs, discussing hypoglycemia and hyperglycemia episodes and symptoms, medications doses, his options of short and long term treatment based on the latest standards of care / guidelines;  discussion about incorporating lifestyle medicine;  and documenting the encounter. Risk reduction counseling performed per USPSTF guidelines to reduce  cardiovascular risk factors.     Please refer to Patient Instructions for Blood Glucose Monitoring and Insulin/Medications Dosing Guide"  in media tab for additional information. Please  also refer to " Patient Self Inventory" in the Media  tab for reviewed elements of pertinent patient history.  Oren Beckmann participated in the discussions, expressed understanding, and voiced agreement with the above plans.  All questions were answered to his satisfaction. he is encouraged to contact clinic should he have any questions or concerns prior to his return visit.    Follow up  plan: - Return in about 3 months (around 07/26/2023) for F/U with Pre-visit Labs, Meter/CGM/Logs, A1c here.  Marquis Lunch, MD [facility] [address] [place], [place] [address] Phone: [phone]  Fax: [phone]    04/26/2023, 10:34 AM  This note was partially dictated with voice recognition software. Similar sounding words can be transcribed inadequately or may not  be corrected upon review.

## 2023-04-26 NOTE — Patient Instructions (Signed)

## 2023-05-07 ENCOUNTER — Ambulatory Visit: Payer: 59 | Admitting: Internal Medicine

## 2023-05-11 ENCOUNTER — Other Ambulatory Visit: Payer: Self-pay | Admitting: Internal Medicine

## 2023-05-11 ENCOUNTER — Other Ambulatory Visit: Payer: Self-pay | Admitting: "Endocrinology

## 2023-05-11 DIAGNOSIS — Z794 Long term (current) use of insulin: Secondary | ICD-10-CM

## 2023-05-11 DIAGNOSIS — I10 Essential (primary) hypertension: Secondary | ICD-10-CM

## 2023-05-11 DIAGNOSIS — E782 Mixed hyperlipidemia: Secondary | ICD-10-CM

## 2023-05-13 ENCOUNTER — Telehealth: Payer: Self-pay | Admitting: "Endocrinology

## 2023-05-13 ENCOUNTER — Encounter: Payer: Self-pay | Admitting: Internal Medicine

## 2023-05-13 ENCOUNTER — Ambulatory Visit: Payer: 59 | Admitting: Internal Medicine

## 2023-05-13 VITALS — BP 110/72 | HR 85 | Ht 67.0 in | Wt 179.6 lb

## 2023-05-13 DIAGNOSIS — E113592 Type 2 diabetes mellitus with proliferative diabetic retinopathy without macular edema, left eye: Secondary | ICD-10-CM

## 2023-05-13 DIAGNOSIS — N529 Male erectile dysfunction, unspecified: Secondary | ICD-10-CM

## 2023-05-13 DIAGNOSIS — E782 Mixed hyperlipidemia: Secondary | ICD-10-CM

## 2023-05-13 DIAGNOSIS — Z23 Encounter for immunization: Secondary | ICD-10-CM | POA: Diagnosis not present

## 2023-05-13 DIAGNOSIS — I10 Essential (primary) hypertension: Secondary | ICD-10-CM | POA: Diagnosis not present

## 2023-05-13 DIAGNOSIS — N400 Enlarged prostate without lower urinary tract symptoms: Secondary | ICD-10-CM

## 2023-05-13 DIAGNOSIS — R809 Proteinuria, unspecified: Secondary | ICD-10-CM

## 2023-05-13 DIAGNOSIS — Z794 Long term (current) use of insulin: Secondary | ICD-10-CM

## 2023-05-13 MED ORDER — GLIPIZIDE ER 5 MG PO TB24: 5.0000 mg | ORAL_TABLET | Freq: Every day | ORAL | 3 refills | Status: DC

## 2023-05-13 MED ORDER — AMLODIPINE BESYLATE 10 MG PO TABS
10.0000 mg | ORAL_TABLET | Freq: Every day | ORAL | 3 refills | Status: DC
Start: 2023-05-13 — End: 2024-05-18

## 2023-05-13 MED ORDER — FINASTERIDE 5 MG PO TABS
5.0000 mg | ORAL_TABLET | Freq: Every day | ORAL | 3 refills | Status: DC
Start: 2023-05-13 — End: 2024-05-18

## 2023-05-13 MED ORDER — LOSARTAN POTASSIUM 100 MG PO TABS
100.0000 mg | ORAL_TABLET | Freq: Every day | ORAL | 3 refills | Status: DC
Start: 2023-05-13 — End: 2024-05-18

## 2023-05-13 MED ORDER — ROSUVASTATIN CALCIUM 10 MG PO TABS
10.0000 mg | ORAL_TABLET | Freq: Every day | ORAL | 3 refills | Status: DC
Start: 2023-05-13 — End: 2024-05-18

## 2023-05-13 MED ORDER — SILDENAFIL CITRATE 100 MG PO TABS
100.0000 mg | ORAL_TABLET | Freq: Every day | ORAL | 2 refills | Status: DC | PRN
Start: 2023-05-13 — End: 2023-08-02

## 2023-05-13 NOTE — Assessment & Plan Note (Signed)
On Flomax Followed by Urology

## 2023-05-13 NOTE — Assessment & Plan Note (Signed)
Likely due to type II DM Followed by Dr. Wolfgang Phoenix On losartan

## 2023-05-13 NOTE — Telephone Encounter (Signed)
Pt is requesting refill for glipizide sent to [facility] in [place].  Pt is also requesting all prescriptions be sent in for a year.

## 2023-05-13 NOTE — Progress Notes (Signed)
Established Patient Office Visit  Subjective:  Patient ID: Chad Kirk, male    DOB: [birth year]  Age: 62 y.o. MRN: [id number]  CC:  Chief Complaint  Patient presents with   Diabetes    Four month follow up    Hypertension    Four month follow up     HPI Chad Kirk is a 62 y.o. male with past medical history of diabetes mellitus type 2, PVD, hypertension, hyperlipidemia, diabetic macular edema, BPH and alcohol abuse who presents for f/u of his chronic medical conditions.  HTN: BP is wnl today. Takes medications regularly - Losartan 100 mg QD and amlodipine 10 mg QD. Patient denies headache, dizziness, chest pain, dyspnea or palpitations.   DM: He follows up with Dr. [name] for diabetes management.  He takes Lantus 28 U at bedtime inconsistently instead of 20 U regularly and has run out of glipizide 5 mg QD. He is advised to follow insulin regimen and diabetic diet as instructed. He follows up with ophthalmologist for diabetic macular edema and his vision has improved now. Denies any polyuria, polyphagia or fatigue.  BPH: Takes Flomax, but has run out of finasteride.  He recently had [place] of renal, which showed postvoid residual of 331 cc.  Denies any dysuria or hematuria.  Proteinuria: He has seen Dr. Wolfgang Phoenix for it. He has had 24-hour urine protein, needs to follow up with Dr. Wolfgang Phoenix.   Past Medical History:  Diagnosis Date   Diabetes mellitus without complication (HCC)    DIAGNOSED AGE 56   Diabetic ulcer of left great toe (HCC) 01/25/2021   Healed diabetic foot ulcer 02/22/2021   Hypertension     Past Surgical History:  Procedure Laterality Date   ESOPHAGEAL DILATION N/A 12/07/2019   Procedure: ESOPHAGEAL OR PYLORIC DILATION;  Surgeon: West Bali, MD;  Location: [facility];  Service: Endoscopy;  Laterality: N/A;   ESOPHAGOGASTRODUODENOSCOPY N/A 12/07/2019   erosive gastritis, many non-bleeding cratered and superficial duodenal ulcers without stigmata of bleeding in  duodenal bulb. Empiric dilation due to possible occult esophageal web. Negative H.pylori.    EYE SURGERY  03/02/2020   diabetic retinopathy, lens placement, cataract removal, right eye   HERNIA REPAIR Left 1990    Family History  Problem Relation Age of Onset   Stroke Mother    Hypertension Mother    Diabetes Mother    Hypertension Father    Diabetes Father    Colon cancer Neg Hx    Colon polyps Neg Hx     Social History   Socioeconomic History   Marital status: Married    Spouse name: Not on file   Number of children: Not on file   Years of education: Not on file   Highest education level: Not on file  Occupational History   Not on file  Tobacco Use   Smoking status: Former    Current packs/day: 0.00    Average packs/day: 1 pack/day for 5.0 years (5.0 ttl pk-yrs)    Types: Cigarettes    Start date: 08/21/1983    Quit date: 08/20/1988    Years since quitting: 34.7   Smokeless tobacco: Never  Vaping Use   Vaping status: Never Used  Substance and Sexual Activity   Alcohol use: Not Currently    Comment: denied 03/15/20   Drug use: No   Sexual activity: Not on file  Other Topics Concern   Not on file  Social History Narrative   Not on file   Social  Determinants of Health   Financial Resource Strain: Not on file  Food Insecurity: Not on file  Transportation Needs: Not on file  Physical Activity: Not on file  Stress: Not on file  Social Connections: Not on file  Intimate Partner Violence: Not on file    Outpatient Medications Prior to Visit  Medication Sig Dispense Refill   benzonatate (TESSALON) 200 MG capsule Take 1 capsule (200 mg total) by mouth 2 (two) times daily as needed for cough. 30 capsule 1   blood glucose meter kit and supplies KIT 1 each by Does not apply route 4 (four) times daily. Dispense based on patient and insurance preference. Use up to four times daily as directed. 1 each 0   Blood Glucose Monitoring Suppl (ACCU-CHEK GUIDE ME) w/Device KIT 1  Piece by Does not apply route as directed. 1 kit 0   Carboxymethylcellulose Sodium (LUBRICANT EYE DROPS OP) Apply 1 Container to eye as needed (dry eye). Left eye     Cholecalciferol (VITAMIN D3) 125 MCG (5000 UT) CAPS Take 1 capsule (5,000 Units total) by mouth daily. 90 capsule 1   Continuous Glucose Receiver (FREESTYLE LIBRE 2 READER) DEVI Use to test BG as directed. E11.65 1 each 0   dorzolamide (TRUSOPT) 2 % ophthalmic solution Place 1 drop into the left eye 3 (three) times daily.     gentamicin cream (GARAMYCIN) 0.1 % Apply to left heel once daily until healed. 30 g 0   glipiZIDE (GLUCOTROL XL) 5 MG 24 hr tablet Take 1 tablet (5 mg total) by mouth daily with breakfast. 90 tablet 1   glucose blood (ONETOUCH VERIO) test strip USE 1 STRIP TO CHECK GLUCOSE UP TO 2 TIMES DAILY 100 each 2   Insulin Pen Needle (B-D ULTRAFINE III SHORT PEN) 31G X 8 MM MISC 1 each by Does not apply route as directed. 100 each 3   Lancets (ONETOUCH DELICA PLUS LANCET33G) MISC Apply topically.     LANTUS SOLOSTAR 100 UNIT/ML Solostar Pen Inject 20 Units into the skin at bedtime. 15 mL 1   Multiple Vitamins-Minerals (QC DAILY MULTIVIT/MULTIMINERAL PO) Take 1 tablet by mouth daily.     mupirocin ointment (BACTROBAN) 2 % Apply 1 Application topically daily. 22 g 0   prednisoLONE acetate (PRED FORTE) 1 % ophthalmic suspension 1 drop 4 (four) times daily.     Probiotic Product (PROBIOTIC PO) Take 1 tablet by mouth daily.     senna-docusate (SENOKOT-S) 8.6-50 MG tablet Take 1 tablet by mouth as needed.      tamsulosin (FLOMAX) 0.4 MG CAPS capsule Take 1 capsule (0.4 mg total) by mouth daily. 90 capsule 3   timolol (TIMOPTIC) 0.5 % ophthalmic solution Place 1 drop into both eyes 3 times daily.     UNABLE TO FIND Take 5 mLs by mouth every 4 (four) hours as needed. Med Name: Apothecary Cough Syrup Take 5 mL by mouth every 4 to 6 hours as needed for cough 120 mL 0   amLODipine (NORVASC) 10 MG tablet Take 1 tablet by mouth once  daily 30 tablet 0   Continuous Glucose Sensor (FREESTYLE LIBRE 2 SENSOR) MISC CHANGE SENSOR EVERY 14 DAYS 2 each 0   finasteride (PROSCAR) 5 MG tablet Take 1 tablet by mouth once daily 90 tablet 0   losartan (COZAAR) 100 MG tablet Take 1 tablet by mouth once daily 90 tablet 0   rosuvastatin (CRESTOR) 10 MG tablet Take 1 tablet (10 mg total) by mouth daily. 90 tablet 0  sildenafil (VIAGRA) 100 MG tablet Take 1 tablet (100 mg total) by mouth daily as needed for erectile dysfunction. 30 tablet 0   No facility-administered medications prior to visit.    No Known Allergies  ROS Review of Systems  Constitutional:  Negative for chills and fever.  HENT:  Negative for congestion and sore throat.   Eyes:  Positive for visual disturbance. Negative for pain and discharge.  Respiratory:  Negative for cough and shortness of breath.   Cardiovascular:  Negative for chest pain and palpitations.  Gastrointestinal:  Negative for diarrhea, nausea and vomiting.  Endocrine: Negative for polydipsia and polyuria.  Genitourinary:  Negative for dysuria and hematuria.  Musculoskeletal:  Negative for neck pain and neck stiffness.  Skin:  Negative for rash.  Neurological:  Negative for dizziness, weakness, numbness and headaches.  Psychiatric/Behavioral:  Negative for agitation and behavioral problems.       Objective:    Physical Exam Vitals reviewed.  Constitutional:      General: He is not in acute distress.    Appearance: He is not diaphoretic.  HENT:     Head: Normocephalic and atraumatic.     Nose: Nose normal.     Mouth/Throat:     Mouth: Mucous membranes are moist.  Eyes:     General: No scleral icterus.    Extraocular Movements: Extraocular movements intact.  Cardiovascular:     Rate and Rhythm: Normal rate and regular rhythm.     Heart sounds: Normal heart sounds. No murmur heard. Pulmonary:     Breath sounds: Normal breath sounds. No wheezing or rales.  Musculoskeletal:     Cervical  back: Neck supple. No tenderness.     Right lower leg: No edema.     Left lower leg: No edema.  Skin:    General: Skin is warm.     Findings: No rash.  Neurological:     General: No focal deficit present.     Mental Status: He is alert and oriented to person, place, and time.     Sensory: No sensory deficit.     Motor: No weakness.  Psychiatric:        Mood and Affect: Mood normal.        Behavior: Behavior normal.     BP 110/72 (BP Location: Left Arm, Patient Position: Sitting, Cuff Size: Normal)   Pulse 85   Ht 5\' 7"  (1.702 m)   Wt 179 lb 9.6 oz (81.5 kg)   SpO2 96%   BMI 28.13 kg/m  Wt Readings from Last 3 Encounters:  05/13/23 179 lb 9.6 oz (81.5 kg)  04/26/23 176 lb 12.8 oz (80.2 kg)  01/03/23 183 lb 3.2 oz (83.1 kg)    Lab Results  Component Value Date   TSH 2.010 12/14/2022   Lab Results  Component Value Date   WBC 6.1 12/12/2021   HGB 14.0 12/12/2021   HCT 42.9 12/12/2021   MCV 81 12/12/2021   PLT 214 12/12/2021   Lab Results  Component Value Date   NA 140 12/14/2022   K 4.4 12/14/2022   CO2 21 12/14/2022   GLUCOSE 79 12/14/2022   BUN 18 12/14/2022   CREATININE 1.28 (H) 12/14/2022   BILITOT 0.4 12/14/2022   ALKPHOS 69 12/14/2022   AST 26 12/14/2022   ALT 38 12/14/2022   PROT 7.2 12/14/2022   ALBUMIN 4.2 12/14/2022   CALCIUM 9.6 12/14/2022   ANIONGAP 8 12/30/2019   EGFR 63 12/14/2022   Lab Results  Component Value Date   CHOL 117 12/14/2022   Lab Results  Component Value Date   HDL 38 (L) 12/14/2022   Lab Results  Component Value Date   LDLCALC 64 12/14/2022   Lab Results  Component Value Date   TRIG 75 12/14/2022   Lab Results  Component Value Date   CHOLHDL 3.1 12/14/2022   Lab Results  Component Value Date   HGBA1C 8.8 (A) 04/26/2023      Assessment & Plan:   Problem List Items Addressed This Visit       Cardiovascular and Mediastinum   Essential hypertension    BP Readings from Last 1 Encounters:  05/13/23 110/72    Well-controlled with Losartan 100 mg daily QD and amlodipine 10 mg QD Used to take Lasix PRN for leg swelling, now improved Counseled for compliance with the medications Advised DASH diet and moderate exercise/walking, at least 150 mins/week      Relevant Medications   losartan (COZAAR) 100 MG tablet   amLODipine (NORVASC) 10 MG tablet   rosuvastatin (CRESTOR) 10 MG tablet   sildenafil (VIAGRA) 100 MG tablet     Endocrine   Type 2 diabetes mellitus with ophthalmic complication (HCC) - Primary    Lab Results  Component Value Date   HGBA1C 8.8 (A) 04/26/2023   Uncontrolled Needs to take Lantus 20 U qHS and Glipizide 5 mg ONCE DAILY regularly F/u with Dr [name] - last visit note reviewed Advised to follow diabetic diet On statin F/u CMP and lipid panel Diabetic eye exam: Advised to continue to follow up with Ophthalmology for diabetic eye exam      Relevant Medications   losartan (COZAAR) 100 MG tablet   rosuvastatin (CRESTOR) 10 MG tablet     Genitourinary   BPH (benign prostatic hyperplasia)    On Flomax Refilled Finasteride Followed by Urology      Relevant Medications   finasteride (PROSCAR) 5 MG tablet     Other   Mixed hyperlipidemia    On Crestor Reviewed lipid profile - needs to be compliant with Crestor      Relevant Medications   losartan (COZAAR) 100 MG tablet   amLODipine (NORVASC) 10 MG tablet   rosuvastatin (CRESTOR) 10 MG tablet   sildenafil (VIAGRA) 100 MG tablet   Erectile dysfunction    Takes Sildenafil 100 mg PRN      Relevant Medications   sildenafil (VIAGRA) 100 MG tablet   Proteinuria    Likely due to type II DM Followed by Dr. Wolfgang Phoenix On losartan      Other Visit Diagnoses     Encounter for immunization       Relevant Orders   Flu vaccine trivalent PF, 6mos and older(Flulaval,Afluria,Fluarix,Fluzone) (Completed)       Meds ordered this encounter  Medications   losartan (COZAAR) 100 MG tablet    Sig: Take 1 tablet (100  mg total) by mouth daily.    Dispense:  90 tablet    Refill:  3   amLODipine (NORVASC) 10 MG tablet    Sig: Take 1 tablet (10 mg total) by mouth daily.    Dispense:  90 tablet    Refill:  3   finasteride (PROSCAR) 5 MG tablet    Sig: Take 1 tablet (5 mg total) by mouth daily.    Dispense:  90 tablet    Refill:  3   rosuvastatin (CRESTOR) 10 MG tablet    Sig: Take 1 tablet (10 mg total) by mouth  daily.    Dispense:  90 tablet    Refill:  3   sildenafil (VIAGRA) 100 MG tablet    Sig: Take 1 tablet (100 mg total) by mouth daily as needed for erectile dysfunction.    Dispense:  30 tablet    Refill:  2    Follow-up: Return in about 4 months (around 09/12/2023) for HTN and DM.    Anabel Halon, MD

## 2023-05-13 NOTE — Telephone Encounter (Signed)
Rx for Glipizide 5mg  daily sent to [facility].

## 2023-05-13 NOTE — Assessment & Plan Note (Signed)
BP Readings from Last 1 Encounters:  05/13/23 110/72   Well-controlled with Losartan 100 mg daily QD and amlodipine 10 mg QD Used to take Lasix PRN for leg swelling, now improved Counseled for compliance with the medications Advised DASH diet and moderate exercise/walking, at least 150 mins/week

## 2023-05-13 NOTE — Assessment & Plan Note (Signed)
Takes Sildenafil 100 mg PRN

## 2023-05-13 NOTE — Assessment & Plan Note (Addendum)
Lab Results  Component Value Date   HGBA1C 8.8 (A) 04/26/2023   Uncontrolled Needs to take Lantus 20 U qHS and Glipizide 5 mg ONCE DAILY regularly F/u with Dr [name] - last visit note reviewed Advised to follow diabetic diet On statin F/u CMP and lipid panel Diabetic eye exam: Advised to continue to follow up with Ophthalmology for diabetic eye exam

## 2023-05-13 NOTE — Patient Instructions (Signed)
Please continue to take medications as prescribed.  Please continue to follow low carb diet and perform moderate exercise/walking as tolerated.  Please ask Pharmacy to fill your prescription of Glipizide from 6th September.

## 2023-05-13 NOTE — Assessment & Plan Note (Addendum)
On Flomax Refilled Finasteride Followed by Urology

## 2023-05-13 NOTE — Assessment & Plan Note (Signed)
On Crestor Reviewed lipid profile - needs to be compliant with Crestor

## 2023-06-03 ENCOUNTER — Ambulatory Visit (INDEPENDENT_AMBULATORY_CARE_PROVIDER_SITE_OTHER): Payer: 59 | Admitting: Podiatry

## 2023-06-03 ENCOUNTER — Encounter: Payer: Self-pay | Admitting: Podiatry

## 2023-06-03 VITALS — BP 138/88 | HR 79

## 2023-06-03 DIAGNOSIS — E1142 Type 2 diabetes mellitus with diabetic polyneuropathy: Secondary | ICD-10-CM

## 2023-06-03 DIAGNOSIS — L84 Corns and callosities: Secondary | ICD-10-CM

## 2023-06-03 DIAGNOSIS — Z794 Long term (current) use of insulin: Secondary | ICD-10-CM | POA: Diagnosis not present

## 2023-06-03 NOTE — Progress Notes (Signed)
       Chief Complaint  Patient presents with   Wound Check    "My wife said it had some discolor to it and that my toenail is rigid."    HPI: 62 y.o. male presents today requesting a recheck of a preulcerative callus that he often gets to the left great toe.  States that this has ulcerated and drained in the past but his wife is getting concerned because it was getting thicker and discolored.  He has an appointment coming up in the next few weeks with Dr. Annamary Rummage.  He is currently wearing sketchers and wears these most of the time.  He is open to getting diabetic shoes in the future.  Denies injury  Past Medical History:  Diagnosis Date   Diabetes mellitus without complication (HCC)    DIAGNOSED AGE 1   Diabetic ulcer of left great toe (HCC) 01/25/2021   Healed diabetic foot ulcer 02/22/2021   Hypertension     Past Surgical History:  Procedure Laterality Date   ESOPHAGEAL DILATION N/A 12/07/2019   Procedure: ESOPHAGEAL OR PYLORIC DILATION;  Surgeon: West Bali, MD;  Location: [facility];  Service: Endoscopy;  Laterality: N/A;   ESOPHAGOGASTRODUODENOSCOPY N/A 12/07/2019   erosive gastritis, many non-bleeding cratered and superficial duodenal ulcers without stigmata of bleeding in duodenal bulb. Empiric dilation due to possible occult esophageal web. Negative H.pylori.    EYE SURGERY  03/02/2020   diabetic retinopathy, lens placement, cataract removal, right eye   HERNIA REPAIR Left 1990    No Known Allergies   Physical Exam: Vitals:   06/03/23 1141  BP: 138/88  Pulse: 79   Palpable DP pulse left foot.  Sparse digital hair.  No active drainage or open lesions noted.  There is hyperkeratotic buildup on the distal aspect of the left hallux as well as the plantar distal aspect of the same toe.  There is some hyperpigmentation to the area.  No fluctuance on palpation of the toe.  He does not have pain secondary to neuropathy.  No edema or erythema is noted.  Upon evaluation  of his shoe his big toe does come to the end of his sketchers shoe.  Assessment/Plan of Care: 1. Type 2 diabetes mellitus with diabetic polyneuropathy, with long-term current use of insulin (HCC)   2. Pre-ulcerative calluses     FOR HOME USE ONLY DME DIABETIC SHOE  Discussed clinical findings with patient today.  The preulcerative callus was shaved with sterile #312 blade.  He does not need further treatment on the area but we do need to try to offload this as best as possible to prevent recurrence.  Order was placed for diabetic shoes with custom diabetic insoles with offloading of this area to prevent recurrence.  Follow-up as scheduled with Dr. Shelva Majestic, DPM, FACFAS [facility]     [address] [place], [place] [address]                Office [phone]  Fax [phone]

## 2023-06-10 ENCOUNTER — Other Ambulatory Visit: Payer: Self-pay

## 2023-06-14 ENCOUNTER — Other Ambulatory Visit: Payer: 59

## 2023-06-14 ENCOUNTER — Other Ambulatory Visit: Payer: Self-pay | Admitting: "Endocrinology

## 2023-06-14 DIAGNOSIS — E113592 Type 2 diabetes mellitus with proliferative diabetic retinopathy without macular edema, left eye: Secondary | ICD-10-CM

## 2023-06-27 ENCOUNTER — Ambulatory Visit: Payer: 59 | Admitting: Podiatry

## 2023-07-04 ENCOUNTER — Ambulatory Visit: Payer: 59 | Admitting: Podiatry

## 2023-07-04 DIAGNOSIS — Z794 Long term (current) use of insulin: Secondary | ICD-10-CM

## 2023-07-04 DIAGNOSIS — M79674 Pain in right toe(s): Secondary | ICD-10-CM | POA: Diagnosis not present

## 2023-07-04 DIAGNOSIS — B351 Tinea unguium: Secondary | ICD-10-CM | POA: Diagnosis not present

## 2023-07-04 DIAGNOSIS — M79675 Pain in left toe(s): Secondary | ICD-10-CM

## 2023-07-04 DIAGNOSIS — L84 Corns and callosities: Secondary | ICD-10-CM

## 2023-07-04 DIAGNOSIS — E1142 Type 2 diabetes mellitus with diabetic polyneuropathy: Secondary | ICD-10-CM

## 2023-07-04 NOTE — Progress Notes (Signed)
  Subjective:  Patient ID: Chad Kirk, male    DOB: 07-18-1961,  MRN: [id number]  Chief Complaint  Patient presents with   Nail Problem    Patient is here for RFC    62 y.o. male presents with the above complaint. History confirmed with patient. Patient presenting with pain related to dystrophic thickened elongated nails. Patient is unable to trim own nails related to nail dystrophy and/or mobility issues. Patient does have a history of T2DM.    Objective:  Physical Exam: warm, good capillary refill nail exam onychomycosis of the toenails DP pulses palpable, PT pulses palpable, and protective sensation absent Left Foot:  Pain with palpation of nails due to elongation and dystrophic growth.  Hyperkeratotic lesion distal aspect of the left hallux Right Foot: Pain with palpation of nails due to elongation and dystrophic growth.  Hyperkeratotic lesion distal aspect of the right hallux  Assessment:   1. Pain due to onychomycosis of toenails of both feet   2. Pre-ulcerative calluses   3. Type 2 diabetes mellitus with diabetic polyneuropathy, with long-term current use of insulin (HCC)      Plan:  Patient was evaluated and treated and all questions answered.  #Hyperkeratotic lesions/pre ulcerative calluses present distal tuft of the hallux bilaterally All symptomatic hyperkeratoses x 2 separate lesions were safely debrided with a sterile #10 blade to patient's level of comfort without incident. We discussed preventative and palliative care of these lesions including supportive and accommodative shoegear, padding, prefabricated and custom molded accommodative orthoses, use of a pumice stone and lotions/creams daily.  #Onychomycosis with pain  -Nails palliatively debrided as below. -Educated on self-care  Procedure: Nail Debridement Rationale: Pain Type of Debridement: manual, sharp debridement. Instrumentation: Nail nipper, rotary burr. Number of Nails: 10  Return in about 3 months  (around 10/04/2023) for [facility].         Corinna Gab, DPM [facility] / [facility]

## 2023-08-02 ENCOUNTER — Other Ambulatory Visit: Payer: Self-pay

## 2023-08-02 DIAGNOSIS — N529 Male erectile dysfunction, unspecified: Secondary | ICD-10-CM

## 2023-08-02 MED ORDER — SILDENAFIL CITRATE 100 MG PO TABS: 100.0000 mg | ORAL_TABLET | Freq: Every day | ORAL | 2 refills | Status: DC | PRN

## 2023-08-13 ENCOUNTER — Other Ambulatory Visit: Payer: Self-pay | Admitting: "Endocrinology

## 2023-08-27 LAB — COMPREHENSIVE METABOLIC PANEL
ALT: 33 [IU]/L (ref 0–44)
AST: 24 [IU]/L (ref 0–40)
Albumin: 4.1 g/dL (ref 3.9–4.9)
Alkaline Phosphatase: 75 [IU]/L (ref 44–121)
BUN/Creatinine Ratio: 17 (ref 10–24)
BUN: 22 mg/dL (ref 8–27)
Bilirubin Total: 0.3 mg/dL (ref 0.0–1.2)
CO2: 22 mmol/L (ref 20–29)
Calcium: 9.1 mg/dL (ref 8.6–10.2)
Chloride: 103 mmol/L (ref 96–106)
Creatinine, Ser: 1.33 mg/dL — ABNORMAL HIGH (ref 0.76–1.27)
Globulin, Total: 2.9 g/dL (ref 1.5–4.5)
Glucose: 264 mg/dL — ABNORMAL HIGH (ref 70–99)
Potassium: 4.9 mmol/L (ref 3.5–5.2)
Sodium: 138 mmol/L (ref 134–144)
Total Protein: 7 g/dL (ref 6.0–8.5)
eGFR: 60 mL/min/{1.73_m2} (ref >59)

## 2023-08-27 LAB — LIPID PANEL
Chol/HDL Ratio: 3.3 {ratio} (ref 0.0–5.0)
Cholesterol, Total: 137 mg/dL (ref 100–199)
HDL: 42 mg/dL (ref >39)
LDL Chol Calc (NIH): 82 mg/dL (ref 0–99)
Triglycerides: 62 mg/dL (ref 0–149)
VLDL Cholesterol Cal: 13 mg/dL (ref 5–40)

## 2023-08-30 ENCOUNTER — Ambulatory Visit (INDEPENDENT_AMBULATORY_CARE_PROVIDER_SITE_OTHER): Payer: 59 | Admitting: "Endocrinology

## 2023-08-30 ENCOUNTER — Encounter: Payer: Self-pay | Admitting: "Endocrinology

## 2023-08-30 VITALS — BP 130/84 | HR 76 | Ht 67.0 in | Wt 181.4 lb

## 2023-08-30 DIAGNOSIS — E782 Mixed hyperlipidemia: Secondary | ICD-10-CM | POA: Diagnosis not present

## 2023-08-30 DIAGNOSIS — E559 Vitamin D deficiency, unspecified: Secondary | ICD-10-CM

## 2023-08-30 DIAGNOSIS — E113592 Type 2 diabetes mellitus with proliferative diabetic retinopathy without macular edema, left eye: Secondary | ICD-10-CM | POA: Diagnosis not present

## 2023-08-30 DIAGNOSIS — Z794 Long term (current) use of insulin: Secondary | ICD-10-CM | POA: Diagnosis not present

## 2023-08-30 DIAGNOSIS — I10 Essential (primary) hypertension: Secondary | ICD-10-CM

## 2023-08-30 LAB — POCT GLYCOSYLATED HEMOGLOBIN (HGB A1C): HbA1c, POC (controlled diabetic range): 9.8 % — AB (ref 0.0–7.0)

## 2023-08-30 MED ORDER — INSULIN LISPRO PROT & LISPRO (75-25 MIX) 100 UNIT/ML KWIKPEN: 30.0000 [IU] | PEN_INJECTOR | Freq: Two times a day (BID) | SUBCUTANEOUS | 2 refills | Status: DC

## 2023-08-30 NOTE — Progress Notes (Signed)
 08/30/2023, 9:48 AM  Endocrinology follow-up note   Subjective:    Patient ID: Chad Kirk, male    DOB: September 19, 1960.  Chad Kirk is being seen in follow-up after he was seen in consultation for management of currently uncontrolled symptomatic diabetes requested by  Tobie Suzzane POUR, MD.   Past Medical History:  Diagnosis Date   Diabetes mellitus without complication (HCC)    DIAGNOSED AGE 63   Diabetic ulcer of left great toe (HCC) 01/25/2021   Healed diabetic foot ulcer 02/22/2021   Hypertension     Past Surgical History:  Procedure Laterality Date   ESOPHAGEAL DILATION N/A 12/07/2019   Procedure: ESOPHAGEAL OR PYLORIC DILATION;  Surgeon: Harvey Margo CROME, MD;  Location: [facility];  Service: Endoscopy;  Laterality: N/A;   ESOPHAGOGASTRODUODENOSCOPY N/A 12/07/2019   erosive gastritis, many non-bleeding cratered and superficial duodenal ulcers without stigmata of bleeding in duodenal bulb. Empiric dilation due to possible occult esophageal web. Negative H.pylori.    EYE SURGERY  03/02/2020   diabetic retinopathy, lens placement, cataract removal, right eye   HERNIA REPAIR Left 1990    Social History   Socioeconomic History   Marital status: Married    Spouse name: Not on file   Number of children: Not on file   Years of education: Not on file   Highest education level: Not on file  Occupational History   Not on file  Tobacco Use   Smoking status: Former    Current packs/day: 0.00    Average packs/day: 1 pack/day for 5.0 years (5.0 ttl pk-yrs)    Types: Cigarettes    Start date: 08/21/1983    Quit date: 08/20/1988    Years since quitting: 35.0   Smokeless tobacco: Never  Vaping Use   Vaping status: Never Used  Substance and Sexual Activity   Alcohol use: Not Currently    Comment: denied 03/15/20   Drug use: No   Sexual activity: Not on file  Other Topics Concern   Not on file   Social History Narrative   Not on file   Social Drivers of Health   Financial Resource Strain: Not on file  Food Insecurity: Not on file  Transportation Needs: Not on file  Physical Activity: Not on file  Stress: Not on file  Social Connections: Not on file    Family History  Problem Relation Age of Onset   Stroke Mother    Hypertension Mother    Diabetes Mother    Hypertension Father    Diabetes Father    Colon cancer Neg Hx    Colon polyps Neg Hx     Outpatient Encounter Medications as of 08/30/2023  Medication Sig   Insulin  Lispro Prot & Lispro (HUMALOG  MIX 75/25 KWIKPEN) (75-25) 100 UNIT/ML Kwikpen Inject 30 Units into the skin 2 (two) times daily before a meal.   amLODipine  (NORVASC ) 10 MG tablet Take 1 tablet (10 mg total) by mouth daily.   benzonatate  (TESSALON ) 200 MG capsule Take 1 capsule (200 mg total) by mouth 2 (two) times daily as needed for cough.   blood glucose meter  kit and supplies KIT 1 each by Does not apply route 4 (four) times daily. Dispense based on patient and insurance preference. Use up to four times daily as directed.   Blood Glucose Monitoring Suppl (ACCU-CHEK GUIDE ME) w/Device KIT 1 Piece by Does not apply route as directed.   Carboxymethylcellulose Sodium (LUBRICANT EYE DROPS OP) Apply 1 Container to eye as needed (dry eye). Left eye   Cholecalciferol (VITAMIN D3) 125 MCG (5000 UT) CAPS Take 1 capsule (5,000 Units total) by mouth daily.   Continuous Glucose Receiver (FREESTYLE LIBRE 2 READER) DEVI Use to test BG as directed. E11.65   Continuous Glucose Sensor (FREESTYLE LIBRE 2 SENSOR) MISC CHANGE SENSOR EVERY 14 DAYS   dorzolamide (TRUSOPT) 2 % ophthalmic solution Place 1 drop into the left eye 3 (three) times daily.   finasteride  (PROSCAR ) 5 MG tablet Take 1 tablet (5 mg total) by mouth daily.   gentamicin  cream (GARAMYCIN ) 0.1 % Apply to left heel once daily until healed.   glipiZIDE  (GLUCOTROL  XL) 5 MG 24 hr tablet Take 1 tablet (5 mg total)  by mouth daily with breakfast.   glucose blood (ONETOUCH VERIO) test strip USE 1 STRIP TO CHECK GLUCOSE UP TO 2 TIMES DAILY   Insulin  Pen Needle (B-D ULTRAFINE III SHORT PEN) 31G X 8 MM MISC 1 each by Does not apply route as directed.   Lancets (ONETOUCH DELICA PLUS LANCET33G) MISC Apply topically.   losartan  (COZAAR ) 100 MG tablet Take 1 tablet (100 mg total) by mouth daily.   Multiple Vitamins-Minerals (QC DAILY MULTIVIT/MULTIMINERAL PO) Take 1 tablet by mouth daily.   mupirocin  ointment (BACTROBAN ) 2 % Apply 1 Application topically daily.   prednisoLONE acetate (PRED FORTE) 1 % ophthalmic suspension 1 drop 4 (four) times daily.   Probiotic Product (PROBIOTIC PO) Take 1 tablet by mouth daily.   rosuvastatin  (CRESTOR ) 10 MG tablet Take 1 tablet (10 mg total) by mouth daily.   senna-docusate (SENOKOT-S) 8.6-50 MG tablet Take 1 tablet by mouth as needed.    sildenafil  (VIAGRA ) 100 MG tablet Take 1 tablet (100 mg total) by mouth daily as needed for erectile dysfunction.   tamsulosin  (FLOMAX ) 0.4 MG CAPS capsule Take 1 capsule (0.4 mg total) by mouth daily.   timolol (TIMOPTIC) 0.5 % ophthalmic solution Place 1 drop into both eyes 3 times daily.   UNABLE TO FIND Take 5 mLs by mouth every 4 (four) hours as needed. Med Name: Apothecary Cough Syrup Take 5 mL by mouth every 4 to 6 hours as needed for cough   [DISCONTINUED] LANTUS  SOLOSTAR 100 UNIT/ML Solostar Pen INJECT 20 UNITS SUBCUTANEOUSLY AT BEDTIME   [DISCONTINUED] rosuvastatin  (CRESTOR ) 10 MG tablet Take 1 tablet by mouth once daily   No facility-administered encounter medications on file as of 08/30/2023.    ALLERGIES: No Known Allergies  VACCINATION STATUS: Immunization History  Administered Date(s) Administered   Fluad Quad(high Dose 65+) 06/27/2021   Influenza, Seasonal, Injecte, Preservative Fre 05/13/2023   Influenza,inj,Quad PF,6+ Mos 05/29/2022   PFIZER(Purple Top)SARS-COV-2 Vaccination 10/17/2019, 11/07/2019   Pneumococcal  Conjugate-13 08/08/2021   Pneumococcal Polysaccharide-23 06/30/2020   Tdap 02/03/2012, 08/30/2022   Zoster Recombinant(Shingrix) 10/05/2021, 11/23/2021    Diabetes He presents for his follow-up diabetic visit. He has type 2 diabetes mellitus. Onset time: He was diagnosed at approximate age of 39 years. His disease course has been worsening. Pertinent negatives for hypoglycemia include no confusion, headaches, nervousness/anxiousness, pallor, seizures, sweats or tremors. Pertinent negatives for diabetes include no blurred vision, no  chest pain, no fatigue, no polydipsia, no polyphagia, no polyuria and no weakness. There are no hypoglycemic complications. Symptoms are worsening. Diabetic complications include impotence, nephropathy, peripheral neuropathy, PVD and retinopathy. (He is reporting legal blindness from diabetic retinopathy on both eyes.) Risk factors for coronary artery disease include dyslipidemia, diabetes mellitus, family history, male sex, hypertension and sedentary lifestyle. Current diabetic treatments: He is currently on Lantus  15-20 units 1-2 times a day. He is compliant with treatment some of the time. His weight is increasing steadily. He is following a generally unhealthy diet. When asked about meal planning, he reported none. His home blood glucose trend is increasing steadily. His breakfast blood glucose range is generally >200 mg/dl. His lunch blood glucose range is generally >200 mg/dl. His dinner blood glucose range is generally >200 mg/dl. His bedtime blood glucose range is generally >200 mg/dl. His overall blood glucose range is >200 mg/dl. (Mr. Civello presents with his CGM device showing worsening glycemic profile.  His 7-day average is 173, 49% time in range, 51% level 1 hyperglycemia.  His point-of-care A1c is 9.8% increasing from 8.8%.  He did not develop any hypoglycemia.  He ran out of his Lantus  and did not refill it.  He recently used some Humalog  from family members.    )  He does not see a podiatrist.Eye exam is current.  Hyperlipidemia This is a chronic problem. The current episode started more than 1 year ago. The problem is uncontrolled. Recent lipid tests were reviewed and are high. Exacerbating diseases include chronic renal disease and diabetes. Pertinent negatives include no chest pain, myalgias or shortness of breath. Current antihyperlipidemic treatment includes statins. Risk factors for coronary artery disease include diabetes mellitus, dyslipidemia, hypertension, male sex, a sedentary lifestyle and family history.  Hypertension This is a chronic problem. The current episode started more than 1 year ago. The problem is controlled. Pertinent negatives include no blurred vision, chest pain, headaches, neck pain, palpitations, shortness of breath or sweats. Risk factors for coronary artery disease include dyslipidemia, family history, diabetes mellitus, male gender and sedentary lifestyle. Past treatments include calcium  channel blockers. Hypertensive end-organ damage includes kidney disease, PVD and retinopathy. Identifiable causes of hypertension include chronic renal disease.     Objective:       08/30/2023    8:46 AM 06/03/2023   11:41 AM 05/13/2023    9:13 AM  Vitals with BMI  Height 5' 7  5' 7  Weight 181 lbs 6 oz  179 lbs 10 oz  BMI 28.4  28.12  Systolic 130 138 889  Diastolic 84 88 72  Pulse 76 79 85    BP 130/84   Pulse 76   Ht 5' 7 (1.702 m)   Wt 181 lb 6.4 oz (82.3 kg)   BMI 28.41 kg/m   Wt Readings from Last 3 Encounters:  08/30/23 181 lb 6.4 oz (82.3 kg)  05/13/23 179 lb 9.6 oz (81.5 kg)  04/26/23 176 lb 12.8 oz (80.2 kg)       CMP ( most recent) CMP     Component Value Date/Time   NA 138 08/26/2023 1016   K 4.9 08/26/2023 1016   CL 103 08/26/2023 1016   CO2 22 08/26/2023 1016   GLUCOSE 264 (H) 08/26/2023 1016   GLUCOSE 256 (H) 12/30/2019 1218   BUN 22 08/26/2023 1016   CREATININE 1.33 (H) 08/26/2023 1016    CALCIUM  9.1 08/26/2023 1016   PROT 7.0 08/26/2023 1016   ALBUMIN 4.1 08/26/2023 1016  AST 24 08/26/2023 1016   ALT 33 08/26/2023 1016   ALKPHOS 75 08/26/2023 1016   BILITOT 0.3 08/26/2023 1016   GFRNONAA >60 12/30/2019 1218   GFRAA >60 12/30/2019 1218     Diabetic Labs (most recent): Lab Results  Component Value Date   HGBA1C 9.8 (A) 08/30/2023   HGBA1C 8.8 (A) 04/26/2023   HGBA1C 8.3 (A) 12/21/2022     Lipid Panel ( most recent) Lipid Panel     Component Value Date/Time   CHOL 137 08/26/2023 1016   TRIG 62 08/26/2023 1016   HDL 42 08/26/2023 1016   CHOLHDL 3.3 08/26/2023 1016   CHOLHDL 4.4 12/30/2019 1218   VLDL 15 12/30/2019 1218   LDLCALC 82 08/26/2023 1016   LABVLDL 13 08/26/2023 1016      Assessment & Plan:   1. Type 2 diabetes, uncontrolled, with retinopathy with macular edema (HCC)  - Kennan Detter has currently uncontrolled symptomatic type 2 DM since  63 years of age.  Mr. Jefferys presents with his CGM device showing worsening glycemic profile.  His 7-day average is 173, 49% time in range, 51% level 1 hyperglycemia.  His point-of-care A1c is 9.8% increasing from 8.8%.  He did not develop any hypoglycemia.  He ran out of his Lantus  and did not refill it.  He recently used some Humalog  from family members.    - I had a long discussion with him about the progressive nature of diabetes and the pathology behind its complications. -his diabetes is complicated by retinopathy, peripheral arterial disease, peripheral neuropathy and he remains at a high risk for more acute and chronic complications which include CAD, CVA, CKD, retinopathy, and neuropathy. These are all discussed in detail with him.  -He did not engage optimally, however he promises to do better on last admission. - he acknowledges that there is a room for improvement in his food and drink choices. - Suggestion is made for him to avoid simple carbohydrates  from his diet including Cakes, Sweet Desserts, Ice  Cream, Soda (diet and regular), Sweet Tea, Candies, Chips, Cookies, Store Bought Juices, Alcohol , Artificial Sweeteners,  Coffee Creamer, and Sugar-free Products, Lemonade. This will help patient to have more stable blood glucose profile and potentially avoid unintended weight gain.  The following Lifestyle Medicine recommendations according to American College of Lifestyle Medicine  [facility]) were discussed and and offered to patient and he  agrees to start the journey:  A. Whole Foods, Plant-Based Nutrition comprising of fruits and vegetables, plant-based proteins, whole-grain carbohydrates was discussed in detail with the patient.   A list for source of those nutrients were also provided to the patient.  Patient will use only water  or unsweetened tea for hydration. B.  The need to stay away from risky substances including alcohol, smoking; obtaining 7 to 9 hours of restorative sleep, at least 150 minutes of moderate intensity exercise weekly, the importance of healthy social connections,  and stress management techniques were discussed. C.  A full color page of  Calorie density of various food groups per pound showing examples of each food groups was provided to the patient.   He is following with [name] for DE. - I have approached him with the following individualized plan to manage  his diabetes and patient agrees:   -In light of his presentation with significantly above target glycemic profile, he is approached for multiple daily injections of insulin .  I discussed and initiated Humalog  75/25 30 units with breakfast and 30  units with supper only when Premeal blood glucose is above 90 mg per DL and only when he is eating.   He is advised to discontinue Lantus  at this time. -He is advised to utilize his CGM continuously.    He is encouraged to call clinic for hypoglycemia below 70 or hyperglycemia above 200 mg per DL.   -He did not obtain his SGLT2 inhibitors prescription filled for  unclear reasons.   He is advised to resume his glipizide  5 mg XL p.o. daily at breakfast.  - he is encouraged to call clinic for blood glucose levels less than 70 or above 200 mg /dl.  - he is warned not to take insulin  without proper monitoring per orders.   - Specific targets for  A1c;  LDL, HDL,  and Triglycerides were discussed with the patient.  2) Blood Pressure /Hypertension: -His blood pressure is controlled to target. he is advised to continue his current medications including amlodipine  10 mg p.o.. daily with breakfast .  He will be considered for HCTZ/lisinopril next visit.   3) Lipids/Hyperlipidemia:   -His recent lipid panel showed near target LDL at 82.  He is advised to continue Crestor  10 mg p.o. daily at bedtime.    side effects and precautions discussed with him.     4)  Weight/Diet:  Body mass index is 28.41 kg/m.  -   he is not a candidate for weight loss. Exercise, and detailed carbohydrates information provided  -  detailed on discharge instructions.  5) Chronic Care/Health Maintenance:  -he  is on  Statin medications and  is encouraged to initiate and continue to follow up with Ophthalmology, Dentist,  Podiatrist at least yearly or according to recommendations, and advised to   stay away from smoking. I have recommended yearly flu vaccine and pneumonia vaccine at least every 5 years; moderate intensity exercise for up to 150 minutes weekly; and  sleep for at least 7 hours a day.  6) vitamin D  deficiency: He is advised to continue his vitamin D3 5 thyroid units daily.     His screening ABI was normal in September 2021.  He has left lower extremity diabetic foot ulcer, under care with podiatry.    His next study is due in September 2027, or sooner if needed.  - he is  advised to maintain close follow up with his pcp for primary care needs, as well as his other providers for optimal and coordinated care   I spent  26  minutes in the care of the patient today  including review of labs from CMP, Lipids, Thyroid Function, Hematology (current and previous including abstractions from other facilities); face-to-face time discussing  his blood glucose readings/logs, discussing hypoglycemia and hyperglycemia episodes and symptoms, medications doses, his options of short and long term treatment based on the latest standards of care / guidelines;  discussion about incorporating lifestyle medicine;  and documenting the encounter. Risk reduction counseling performed per USPSTF guidelines to reduce cardiovascular risk factors.     Please refer to Patient Instructions for Blood Glucose Monitoring and Insulin /Medications Dosing Guide  in media tab for additional information. Please  also refer to  Patient Self Inventory in the Media  tab for reviewed elements of pertinent patient history.  Dasie Moats participated in the discussions, expressed understanding, and voiced agreement with the above plans.  All questions were answered to his satisfaction. he is encouraged to contact clinic should he have any questions or concerns prior to his return  visit.   Follow up plan: - Return in about 3 weeks (around 09/20/2023) for F/U with Meter/CGM /Logs Only - no Labs.  Ranny Earl, MD [facility] [address] [place], [place] [address] Phone: [phone]  Fax: [phone]    08/30/2023, 9:48 AM  This note was partially dictated with voice recognition software. Similar sounding words can be transcribed inadequately or may not  be corrected upon review.

## 2023-08-30 NOTE — Patient Instructions (Signed)

## 2023-09-01 ENCOUNTER — Other Ambulatory Visit: Payer: Self-pay | Admitting: Urology

## 2023-09-01 DIAGNOSIS — N400 Enlarged prostate without lower urinary tract symptoms: Secondary | ICD-10-CM

## 2023-09-05 ENCOUNTER — Ambulatory Visit: Payer: 59 | Admitting: Podiatry

## 2023-09-09 ENCOUNTER — Other Ambulatory Visit: Payer: Self-pay | Admitting: "Endocrinology

## 2023-09-09 DIAGNOSIS — Z794 Long term (current) use of insulin: Secondary | ICD-10-CM

## 2023-09-16 ENCOUNTER — Ambulatory Visit: Payer: 59 | Admitting: Internal Medicine

## 2023-09-25 ENCOUNTER — Ambulatory Visit (INDEPENDENT_AMBULATORY_CARE_PROVIDER_SITE_OTHER): Payer: 59 | Admitting: Podiatry

## 2023-09-25 ENCOUNTER — Encounter: Payer: Self-pay | Admitting: Podiatry

## 2023-09-25 VITALS — Ht 67.0 in | Wt 181.0 lb

## 2023-09-25 DIAGNOSIS — L84 Corns and callosities: Secondary | ICD-10-CM

## 2023-09-25 DIAGNOSIS — B351 Tinea unguium: Secondary | ICD-10-CM | POA: Diagnosis not present

## 2023-09-25 DIAGNOSIS — M79675 Pain in left toe(s): Secondary | ICD-10-CM

## 2023-09-25 DIAGNOSIS — M79674 Pain in right toe(s): Secondary | ICD-10-CM

## 2023-09-25 DIAGNOSIS — Z794 Long term (current) use of insulin: Secondary | ICD-10-CM

## 2023-09-25 DIAGNOSIS — E1142 Type 2 diabetes mellitus with diabetic polyneuropathy: Secondary | ICD-10-CM | POA: Diagnosis not present

## 2023-09-26 ENCOUNTER — Ambulatory Visit: Payer: 59 | Admitting: "Endocrinology

## 2023-10-03 NOTE — Progress Notes (Signed)
  Subjective:  Patient ID: Chad Kirk, male    DOB: [birth year],  MRN: [id number]  Riker Collier presents to clinic today for at risk foot care with history of diabetic neuropathy and preulcerative lesion(s) of both feet and painful mycotic toenails that limit ambulation. Painful toenails interfere with ambulation. Aggravating factors include wearing enclosed shoe gear. Pain is relieved with periodic professional debridement. Painful preulcerative lesion(s) is/are aggravated when weightbearing with and without shoegear. Pain is relieved with periodic professional debridement.  Chief Complaint  Patient presents with   [facility]    He he is here for a nail trim, last A1C was almost a 9:and PCP is Dr. Tobie and seen 3 months ago.    New problem(s): None.   PCP is Tobie Suzzane POUR, MD.  No Known Allergies  Review of Systems: Negative except as noted in the HPI.  Objective: No changes noted in today's physical examination. There were no vitals filed for this visit. Colvin Blatt is a pleasant 63 y.o. male WD, WN in NAD. AAO x 3.  Vascular Examination: CFT <3 seconds b/l. DP pulses faintly palpable b/l. PT pulses nonpalpable b/l. Digital hair absent. Skin temperature gradient warm to warm b/l. No pain with calf compression. No ischemia or gangrene. No cyanosis or clubbing noted b/l.    Neurological Examination: Protective sensation diminished with 10g monofilament b/l.  Dermatological Examination: Pedal skin warm and supple b/l. No open wounds b/l. No interdigital macerations. Toenails 1-5 b/l thick, discolored, elongated with subungual debris and pain on dorsal palpation.  Preulcerative lesion noted bilateral great toes. There is visible subdermal hemorrhage. There is no surrounding erythema, no edema, no drainage, no odor, no fluctuance.  Musculoskeletal Examination: Muscle strength 5/5 to all lower extremity muscle groups bilaterally. HAV with bunion bilaterally and hammertoes 2-5  b/l.  Radiographs: None  Last HgA1c:      Latest Ref Rng & Units 08/30/2023    9:02 AM 04/26/2023    9:43 AM 12/21/2022    9:16 AM  Hemoglobin A1C  Hemoglobin-A1c 0.0 - 7.0 % 9.8  8.8  8.3    Assessment/Plan: 1. Pain due to onychomycosis of toenails of both feet   2. Pre-ulcerative calluses   3. Type 2 diabetes mellitus with diabetic polyneuropathy, with long-term current use of insulin  [facility])    -Patient was evaluated today. All questions/concerns addressed on today's visit. -Continue foot and shoe inspections daily. Monitor blood glucose per PCP/Endocrinologist's recommendations. -Mycotic toenails 1-5 bilaterally were debrided in length and girth with sterile nail nippers and dremel without incident. -Preulcerative lesion pared bilateral great toes utilizing sterile scalpel blade. Total number pared=2. -Patient/POA to call should there be question/concern in the interim.   Return in about 3 months (around 12/23/2023).  Delon LITTIE Merlin, DPM      [place] LOCATION: [address] [place] [address]                   Office [phone]   [facility] LOCATION: [address] [place], [place] [address] Office [phone]

## 2023-10-09 ENCOUNTER — Ambulatory Visit: Payer: Self-pay | Admitting: Internal Medicine

## 2023-10-09 ENCOUNTER — Ambulatory Visit: Payer: 59 | Admitting: "Endocrinology

## 2023-10-24 ENCOUNTER — Ambulatory Visit (INDEPENDENT_AMBULATORY_CARE_PROVIDER_SITE_OTHER): Payer: 59 | Admitting: "Endocrinology

## 2023-10-24 ENCOUNTER — Encounter: Payer: Self-pay | Admitting: "Endocrinology

## 2023-10-24 VITALS — BP 130/84 | HR 92 | Ht 67.0 in | Wt 186.2 lb

## 2023-10-24 DIAGNOSIS — E559 Vitamin D deficiency, unspecified: Secondary | ICD-10-CM

## 2023-10-24 DIAGNOSIS — E113592 Type 2 diabetes mellitus with proliferative diabetic retinopathy without macular edema, left eye: Secondary | ICD-10-CM

## 2023-10-24 DIAGNOSIS — E782 Mixed hyperlipidemia: Secondary | ICD-10-CM | POA: Diagnosis not present

## 2023-10-24 DIAGNOSIS — I10 Essential (primary) hypertension: Secondary | ICD-10-CM | POA: Diagnosis not present

## 2023-10-24 DIAGNOSIS — Z794 Long term (current) use of insulin: Secondary | ICD-10-CM | POA: Diagnosis not present

## 2023-10-24 MED ORDER — INSULIN LISPRO PROT & LISPRO (75-25 MIX) 100 UNIT/ML KWIKPEN: 40.0000 [IU] | PEN_INJECTOR | Freq: Two times a day (BID) | SUBCUTANEOUS | 2 refills | Status: DC

## 2023-10-24 NOTE — Patient Instructions (Signed)

## 2023-10-24 NOTE — Progress Notes (Signed)
 10/24/2023, 11:43 AM  Endocrinology follow-up note   Subjective:    Patient ID: Chad Kirk, male    DOB: Jan 31, 1961.  Chad Kirk is being seen in follow-up after he was seen in consultation for management of currently uncontrolled symptomatic diabetes requested by  Anabel Halon, MD.   Past Medical History:  Diagnosis Date   Diabetes mellitus without complication (HCC)    DIAGNOSED AGE 63   Diabetic ulcer of left great toe (HCC) 01/25/2021   Healed diabetic foot ulcer 02/22/2021   Hypertension     Past Surgical History:  Procedure Laterality Date   ESOPHAGEAL DILATION N/A 12/07/2019   Procedure: ESOPHAGEAL OR PYLORIC DILATION;  Surgeon: West Bali, MD;  Location: [facility];  Service: Endoscopy;  Laterality: N/A;   ESOPHAGOGASTRODUODENOSCOPY N/A 12/07/2019   erosive gastritis, many non-bleeding cratered and superficial duodenal ulcers without stigmata of bleeding in duodenal bulb. Empiric dilation due to possible occult esophageal web. Negative H.pylori.    EYE SURGERY  03/02/2020   diabetic retinopathy, lens placement, cataract removal, right eye   HERNIA REPAIR Left 1990    Social History   Socioeconomic History   Marital status: Married    Spouse name: Not on file   Number of children: Not on file   Years of education: Not on file   Highest education level: Not on file  Occupational History   Not on file  Tobacco Use   Smoking status: Former    Current packs/day: 0.00    Average packs/day: 1 pack/day for 5.0 years (5.0 ttl pk-yrs)    Types: Cigarettes    Start date: 08/21/1983    Quit date: 08/20/1988    Years since quitting: 35.2   Smokeless tobacco: Never  Vaping Use   Vaping status: Never Used  Substance and Sexual Activity   Alcohol use: Not Currently    Comment: denied 03/15/20   Drug use: No   Sexual activity: Not on file  Other Topics Concern   Not on file   Social History Narrative   Not on file   Social Drivers of Health   Financial Resource Strain: Not on file  Food Insecurity: Not on file  Transportation Needs: Not on file  Physical Activity: Not on file  Stress: Not on file  Social Connections: Not on file    Family History  Problem Relation Age of Onset   Stroke Mother    Hypertension Mother    Diabetes Mother    Hypertension Father    Diabetes Father    Colon cancer Neg Hx    Colon polyps Neg Hx     Outpatient Encounter Medications as of 10/24/2023  Medication Sig   amLODipine (NORVASC) 10 MG tablet Take 1 tablet (10 mg total) by mouth daily.   benzonatate (TESSALON) 200 MG capsule Take 1 capsule (200 mg total) by mouth 2 (two) times daily as needed for cough.   Blood Glucose Monitoring Suppl (ACCU-CHEK GUIDE ME) w/Device KIT 1 Piece by Does not apply route as directed.   Carboxymethylcellulose Sodium (LUBRICANT EYE DROPS OP) Apply 1 Container to eye  as needed (dry eye). Left eye   Cholecalciferol (VITAMIN D3) 125 MCG (5000 UT) CAPS Take 1 capsule (5,000 Units total) by mouth daily.   Continuous Glucose Receiver (FREESTYLE LIBRE 2 READER) DEVI Use to test BG as directed. E11.65   Continuous Glucose Sensor (FREESTYLE LIBRE 2 SENSOR) MISC CHANGE SENSOR EVERY 14 DAYS   dorzolamide (TRUSOPT) 2 % ophthalmic solution Place 1 drop into the left eye 3 (three) times daily.   finasteride (PROSCAR) 5 MG tablet Take 1 tablet (5 mg total) by mouth daily.   gentamicin cream (GARAMYCIN) 0.1 % Apply to left heel once daily until healed.   glipiZIDE (GLUCOTROL XL) 5 MG 24 hr tablet Take 1 tablet (5 mg total) by mouth daily with breakfast.   glucose blood (ONETOUCH VERIO) test strip USE 1 STRIP TO CHECK GLUCOSE UP TO 2 TIMES DAILY   Insulin Lispro Prot & Lispro (HUMALOG MIX 75/25 KWIKPEN) (75-25) 100 UNIT/ML Kwikpen Inject 40 Units into the skin 2 (two) times daily before a meal.   Insulin Pen Needle (B-D ULTRAFINE III SHORT PEN) 31G X 8 MM  MISC 1 each by Does not apply route as directed.   Lancets (ONETOUCH DELICA PLUS LANCET33G) MISC Apply topically.   losartan (COZAAR) 100 MG tablet Take 1 tablet (100 mg total) by mouth daily.   Multiple Vitamins-Minerals (QC DAILY MULTIVIT/MULTIMINERAL PO) Take 1 tablet by mouth daily.   mupirocin ointment (BACTROBAN) 2 % Apply 1 Application topically daily.   prednisoLONE acetate (PRED FORTE) 1 % ophthalmic suspension 1 drop 4 (four) times daily.   Probiotic Product (PROBIOTIC PO) Take 1 tablet by mouth daily.   rosuvastatin (CRESTOR) 10 MG tablet Take 1 tablet (10 mg total) by mouth daily.   senna-docusate (SENOKOT-S) 8.6-50 MG tablet Take 1 tablet by mouth as needed.    sildenafil (VIAGRA) 100 MG tablet Take 1 tablet (100 mg total) by mouth daily as needed for erectile dysfunction.   tamsulosin (FLOMAX) 0.4 MG CAPS capsule Take 1 capsule by mouth once daily   timolol (TIMOPTIC) 0.5 % ophthalmic solution Place 1 drop into both eyes 3 times daily.   UNABLE TO FIND Take 5 mLs by mouth every 4 (four) hours as needed. Med Name: Apothecary Cough Syrup Take 5 mL by mouth every 4 to 6 hours as needed for cough   [DISCONTINUED] Insulin Lispro Prot & Lispro (HUMALOG MIX 75/25 KWIKPEN) (75-25) 100 UNIT/ML Kwikpen Inject 30 Units into the skin 2 (two) times daily before a meal. (Patient taking differently: Inject 20-30 Units into the skin 2 (two) times daily before a meal.)   No facility-administered encounter medications on file as of 10/24/2023.    ALLERGIES: No Known Allergies  VACCINATION STATUS: Immunization History  Administered Date(s) Administered   Fluad Quad(high Dose 65+) 06/27/2021   Influenza, Seasonal, Injecte, Preservative Fre 05/13/2023   Influenza,inj,Quad PF,6+ Mos 05/29/2022   PFIZER(Purple Top)SARS-COV-2 Vaccination 10/17/2019, 11/07/2019   Pneumococcal Conjugate-13 08/08/2021   Pneumococcal Polysaccharide-23 06/30/2020   Tdap 02/03/2012, 08/30/2022   Zoster  Recombinant(Shingrix) 10/05/2021, 11/23/2021    Diabetes He presents for his follow-up diabetic visit. He has type 2 diabetes mellitus. Onset time: He was diagnosed at approximate age of 39 years. His disease course has been worsening. Pertinent negatives for hypoglycemia include no confusion, headaches, nervousness/anxiousness, pallor, seizures, sweats or tremors. Pertinent negatives for diabetes include no blurred vision, no chest pain, no fatigue, no polydipsia, no polyphagia, no polyuria and no weakness. There are no hypoglycemic complications. Symptoms are worsening. Diabetic  complications include impotence, nephropathy, peripheral neuropathy, PVD and retinopathy. (He is reporting legal blindness from diabetic retinopathy on both eyes.) Risk factors for coronary artery disease include dyslipidemia, diabetes mellitus, family history, male sex, hypertension and sedentary lifestyle. Current diabetic treatments: He is currently on Lantus 15-20 units 1-2 times a day. He is compliant with treatment some of the time. His weight is increasing steadily. He is following a generally unhealthy diet. When asked about meal planning, he reported none. His home blood glucose trend is fluctuating minimally. His breakfast blood glucose range is generally 180-200 mg/dl. His Kirk blood glucose range is generally 180-200 mg/dl. His dinner blood glucose range is generally 180-200 mg/dl. His bedtime blood glucose range is generally 180-200 mg/dl. His overall blood glucose range is 180-200 mg/dl. (Chad Kirk presents with his CGM device showing 44% time in range, 45% level 1 hyperglycemia, 10% level 2 hide did not have hypoglycemia.  His average blood glucose 185 mg for the most recent 14 days.  His previsit A1c was 9.8% unchanged from his previous measurements.    ) He does not see a podiatrist.Eye exam is current.  Hyperlipidemia This is a chronic problem. The current episode started more than 1 year ago. The problem is  uncontrolled. Recent lipid tests were reviewed and are high. Exacerbating diseases include chronic renal disease and diabetes. Pertinent negatives include no chest pain, myalgias or shortness of breath. Current antihyperlipidemic treatment includes statins. Risk factors for coronary artery disease include diabetes mellitus, dyslipidemia, hypertension, male sex, a sedentary lifestyle and family history.  Hypertension This is a chronic problem. The current episode started more than 1 year ago. The problem is controlled. Pertinent negatives include no blurred vision, chest pain, headaches, neck pain, palpitations, shortness of breath or sweats. Risk factors for coronary artery disease include dyslipidemia, family history, diabetes mellitus, male gender and sedentary lifestyle. Past treatments include calcium channel blockers. Hypertensive end-organ damage includes kidney disease, PVD and retinopathy. Identifiable causes of hypertension include chronic renal disease.     Objective:       10/24/2023   10:05 AM 09/25/2023   11:03 AM 08/30/2023    8:46 AM  Vitals with BMI  Height 5\' 7"  5\' 7"  5\' 7"   Weight 186 lbs 3 oz 181 lbs 181 lbs 6 oz  BMI 29.16 28.34 28.4  Systolic 130  130  Diastolic 84  84  Pulse 92  76    BP 130/84   Pulse 92   Ht 5\' 7"  (1.702 m)   Wt 186 lb 3.2 oz (84.5 kg)   BMI 29.16 kg/m   Wt Readings from Last 3 Encounters:  10/24/23 186 lb 3.2 oz (84.5 kg)  09/25/23 181 lb (82.1 kg)  08/30/23 181 lb 6.4 oz (82.3 kg)       CMP ( most recent) CMP     Component Value Date/Time   NA 138 08/26/2023 1016   K 4.9 08/26/2023 1016   CL 103 08/26/2023 1016   CO2 22 08/26/2023 1016   GLUCOSE 264 (H) 08/26/2023 1016   GLUCOSE 256 (H) 12/30/2019 1218   BUN 22 08/26/2023 1016   CREATININE 1.33 (H) 08/26/2023 1016   CALCIUM 9.1 08/26/2023 1016   PROT 7.0 08/26/2023 1016   ALBUMIN 4.1 08/26/2023 1016   AST 24 08/26/2023 1016   ALT 33 08/26/2023 1016   ALKPHOS 75 08/26/2023 1016    BILITOT 0.3 08/26/2023 1016   GFRNONAA >60 12/30/2019 1218   GFRAA >60 12/30/2019 1218  Diabetic Labs (most recent): Lab Results  Component Value Date   HGBA1C 9.8 (A) 08/30/2023   HGBA1C 8.8 (A) 04/26/2023   HGBA1C 8.3 (A) 12/21/2022     Lipid Panel ( most recent) Lipid Panel     Component Value Date/Time   CHOL 137 08/26/2023 1016   TRIG 62 08/26/2023 1016   HDL 42 08/26/2023 1016   CHOLHDL 3.3 08/26/2023 1016   CHOLHDL 4.4 12/30/2019 1218   VLDL 15 12/30/2019 1218   LDLCALC 82 08/26/2023 1016   LABVLDL 13 08/26/2023 1016      Assessment & Plan:   1. Type 2 diabetes, uncontrolled, with retinopathy with macular edema (HCC)  - Chad Kirk has currently uncontrolled symptomatic type 2 DM since  63 years of age.  Chad Kirk presents with his CGM device showing 44% time in range, 45% level 1 hyperglycemia, 10% level 2 hide did not have hypoglycemia.  His average blood glucose 185 mg for the most recent 14 days.  His previsit A1c was 9.8% unchanged from his previous measurements.    - I had a long discussion with him about the progressive nature of diabetes and the pathology behind its complications. -his diabetes is complicated by retinopathy, peripheral arterial disease, peripheral neuropathy and he remains at a high risk for more acute and chronic complications which include CAD, CVA, CKD, retinopathy, and neuropathy. These are all discussed in detail with him.  -He did not engage optimally, however he promises to do better on last admission. - he acknowledges that there is a room for improvement in his food and drink choices. - Suggestion is made for him to avoid simple carbohydrates  from his diet including Cakes, Sweet Desserts, Ice Cream, Soda (diet and regular), Sweet Tea, Candies, Chips, Cookies, Store Bought Juices, Alcohol , Artificial Sweeteners,  Coffee Creamer, and "Sugar-free" Products, Lemonade. This will help patient to have more stable blood glucose  profile and potentially avoid unintended weight gain.  The following Lifestyle Medicine recommendations according to American College of Lifestyle Medicine  [facility]) were discussed and and offered to patient and he  agrees to start the journey:  A. Whole Foods, Plant-Based Nutrition comprising of fruits and vegetables, plant-based proteins, whole-grain carbohydrates was discussed in detail with the patient.   A list for source of those nutrients were also provided to the patient.  Patient will use only water or unsweetened tea for hydration. B.  The need to stay away from risky substances including alcohol, smoking; obtaining 7 to 9 hours of restorative sleep, at least 150 minutes of moderate intensity exercise weekly, the importance of healthy social connections,  and stress management techniques were discussed. C.  A full color page of  Calorie density of various food groups per pound showing examples of each food groups was provided to the patient.   He is following with Norm Salt for DE. - I have approached him with the following individualized plan to manage  his diabetes and patient agrees:   -In light of his presentation with significantly above target glycemic profile, he will need multiple daily injections of insulin in order for him to control diabetes with diabetic   Accordingly, he is advised to increase his Humalog 75/25 to 40 units with breakfast and 40 units with supper  only when Premeal blood glucose is above 90 mg per DL and only when he is eating.    -He is advised to utilize his CGM continuously.    He is encouraged to  call clinic for hypoglycemia below 70 or hyperglycemia above 200 mg per DL.   -He did not obtain his SGLT2 inhibitors prescription filled for unclear reasons.   He is advised to resume his glipizide 5 mg XL p.o. daily at breakfast.  - he is encouraged to call clinic for blood glucose levels less than 70 or above 200 mg /dl.  - he is warned not to take  insulin without proper monitoring per orders.   - Specific targets for  A1c;  LDL, HDL,  and Triglycerides were discussed with the patient.  2) Blood Pressure /Hypertension: His blood pressure is controlled to target. he is advised to continue his current medications including amlodipine 10 mg p.o.. daily with breakfast .  He will be considered for HCTZ/lisinopril next visit.   3) Lipids/Hyperlipidemia:   -His recent lipid panel showed near target LDL at 82.  He is advised to continue Crestor 10 mg p.o. daily at bedtime.     side effects and precautions discussed with him.     4)  Weight/Diet:  Body mass index is 29.16 kg/m.  -   he is not a candidate for weight loss. Exercise, and detailed carbohydrates information provided  -  detailed on discharge instructions.  5) Chronic Care/Health Maintenance:  -he  is on  Statin medications and  is encouraged to initiate and continue to follow up with Ophthalmology, Dentist,  Podiatrist at least yearly or according to recommendations, and advised to   stay away from smoking. I have recommended yearly flu vaccine and pneumonia vaccine at least every 5 years; moderate intensity exercise for up to 150 minutes weekly; and  sleep for at least 7 hours a day.  6) vitamin D deficiency: He is advised to continue his vitamin D3 5 thyroid units daily.     His screening ABI was normal in September 2021.  He has left lower extremity diabetic foot ulcer, under care with podiatry.    His next study is due in September 2027, or sooner if needed.  - he is  advised to maintain close follow up with his pcp for primary care needs, as well as his other providers for optimal and coordinated care   I spent  26  minutes in the care of the patient today including review of labs from CMP, Lipids, Thyroid Function, Hematology (current and previous including abstractions from other facilities); face-to-face time discussing  his blood glucose readings/logs, discussing  hypoglycemia and hyperglycemia episodes and symptoms, medications doses, his options of short and long term treatment based on the latest standards of care / guidelines;  discussion about incorporating lifestyle medicine;  and documenting the encounter. Risk reduction counseling performed per USPSTF guidelines to reduce  obesity and cardiovascular risk factors.     Please refer to Patient Instructions for Blood Glucose Monitoring and Insulin/Medications Dosing Guide"  in media tab for additional information. Please  also refer to " Patient Self Inventory" in the Media  tab for reviewed elements of pertinent patient history.  Chad Kirk participated in the discussions, expressed understanding, and voiced agreement with the above plans.  All questions were answered to his satisfaction. he is encouraged to contact clinic should he have any questions or concerns prior to his return visit.   Follow up plan: - Return in about 3 months (around 01/24/2024) for Bring Meter/CGM Device/Logs- A1c in Office.  Chad Lunch, MD [facility] [address] [place], [place] [address] Phone: [phone]  Fax: [phone]    10/24/2023, 11:43 AM  This note was partially dictated with voice recognition software. Similar sounding words can be transcribed inadequately or may not  be corrected upon review.

## 2023-10-31 ENCOUNTER — Other Ambulatory Visit: Payer: Self-pay | Admitting: Internal Medicine

## 2023-10-31 DIAGNOSIS — N529 Male erectile dysfunction, unspecified: Secondary | ICD-10-CM

## 2023-11-10 ENCOUNTER — Other Ambulatory Visit: Payer: Self-pay | Admitting: "Endocrinology

## 2023-11-24 ENCOUNTER — Other Ambulatory Visit: Payer: Self-pay | Admitting: Urology

## 2023-11-24 DIAGNOSIS — N400 Enlarged prostate without lower urinary tract symptoms: Secondary | ICD-10-CM

## 2023-11-28 ENCOUNTER — Ambulatory Visit: Payer: Self-pay | Admitting: Internal Medicine

## 2023-12-02 ENCOUNTER — Other Ambulatory Visit: Payer: Self-pay | Admitting: "Endocrinology

## 2023-12-02 DIAGNOSIS — E113592 Type 2 diabetes mellitus with proliferative diabetic retinopathy without macular edema, left eye: Secondary | ICD-10-CM

## 2023-12-12 ENCOUNTER — Telehealth: Payer: Self-pay | Admitting: Internal Medicine

## 2023-12-12 ENCOUNTER — Other Ambulatory Visit: Payer: Self-pay | Admitting: Internal Medicine

## 2023-12-12 DIAGNOSIS — T753XXA Motion sickness, initial encounter: Secondary | ICD-10-CM

## 2023-12-12 MED ORDER — SCOPOLAMINE 1 MG/3DAYS TD PT72: 1.0000 | MEDICATED_PATCH | TRANSDERMAL | 0 refills | Status: AC

## 2023-12-12 NOTE — Telephone Encounter (Unsigned)
 Copied from CRM [id number]. Topic: General - Other >> Dec 12, 2023  3:45 PM Emylou G wrote: Reason for CRM: Patient called.Chad Kirk looking for nausea patches for his cruise.Chad Kirk Pls call him number on file is good.Chad Kirk

## 2023-12-13 NOTE — Telephone Encounter (Signed)
 Left detailed vm

## 2023-12-13 NOTE — Telephone Encounter (Signed)
 Pt returned call and verbalized understanding of message

## 2023-12-31 ENCOUNTER — Ambulatory Visit: Payer: 59 | Admitting: Podiatry

## 2024-01-01 ENCOUNTER — Telehealth: Payer: Self-pay | Admitting: Nurse Practitioner

## 2024-01-01 ENCOUNTER — Other Ambulatory Visit: Payer: Self-pay | Admitting: "Endocrinology

## 2024-01-01 MED ORDER — INSULIN PEN NEEDLE 29G X 5MM MISC: 1 refills | Status: DC

## 2024-01-01 NOTE — Telephone Encounter (Signed)
 Will forward to Nida as it is his patient.

## 2024-01-01 NOTE — Telephone Encounter (Signed)
 Since last visit, pt's needles have been defective. Please Advise?

## 2024-01-02 NOTE — Telephone Encounter (Signed)
 He said he will contact his pharmacy and made aware of the other msg

## 2024-01-05 ENCOUNTER — Other Ambulatory Visit: Payer: Self-pay | Admitting: Internal Medicine

## 2024-01-05 DIAGNOSIS — N529 Male erectile dysfunction, unspecified: Secondary | ICD-10-CM

## 2024-01-07 LAB — HM DIABETES EYE EXAM

## 2024-01-14 ENCOUNTER — Other Ambulatory Visit: Payer: Self-pay | Admitting: Internal Medicine

## 2024-01-14 DIAGNOSIS — R059 Cough, unspecified: Secondary | ICD-10-CM

## 2024-01-14 NOTE — Telephone Encounter (Unsigned)
 Copied from CRM [id number]. Topic: Clinical - Medication Refill >> Jan 14, 2024  1:42 PM Corin V wrote: Medication: benzonatate  (TESSALON ) 200 MG capsule Sudafed (couldn't remember dosage, said he was given 10 day supply by pharmacy  Has the patient contacted their pharmacy? Yes (Agent: If no, request that the patient contact the pharmacy for the refill. If patient does not wish to contact the pharmacy document the reason why and proceed with request.) (Agent: If yes, when and what did the pharmacy advise?)  This is the patient's preferred pharmacy:  [facility] [address] [place] - [address] [place] [address] Phone: [phone] Fax: [phone]  Is this the correct pharmacy for this prescription? Yes If no, delete pharmacy and type the correct one.   Has the prescription been filled recently? No  Is the patient out of the medication? Yes  Has the patient been seen for an appointment in the last year OR does the patient have an upcoming appointment? Yes  Can we respond through MyChart? Yes  Agent: Please be advised that Rx refills may take up to 3 business days. We ask that you follow-up with your pharmacy.

## 2024-01-15 MED ORDER — BENZONATATE 200 MG PO CAPS: 200.0000 mg | ORAL_CAPSULE | Freq: Two times a day (BID) | ORAL | 1 refills | Status: AC | PRN

## 2024-01-23 ENCOUNTER — Other Ambulatory Visit: Payer: Self-pay | Admitting: Internal Medicine

## 2024-01-23 DIAGNOSIS — N529 Male erectile dysfunction, unspecified: Secondary | ICD-10-CM

## 2024-01-27 ENCOUNTER — Ambulatory Visit: Admitting: "Endocrinology

## 2024-02-02 ENCOUNTER — Other Ambulatory Visit: Payer: Self-pay | Admitting: Internal Medicine

## 2024-02-02 DIAGNOSIS — N529 Male erectile dysfunction, unspecified: Secondary | ICD-10-CM

## 2024-02-06 ENCOUNTER — Ambulatory Visit: Payer: Self-pay | Admitting: Internal Medicine

## 2024-02-09 ENCOUNTER — Other Ambulatory Visit: Payer: Self-pay | Admitting: Internal Medicine

## 2024-02-09 DIAGNOSIS — N529 Male erectile dysfunction, unspecified: Secondary | ICD-10-CM

## 2024-02-22 ENCOUNTER — Other Ambulatory Visit: Payer: Self-pay | Admitting: Urology

## 2024-02-22 DIAGNOSIS — N400 Enlarged prostate without lower urinary tract symptoms: Secondary | ICD-10-CM

## 2024-02-25 ENCOUNTER — Ambulatory Visit: Payer: Self-pay

## 2024-02-25 ENCOUNTER — Ambulatory Visit (INDEPENDENT_AMBULATORY_CARE_PROVIDER_SITE_OTHER): Admitting: "Endocrinology

## 2024-02-25 ENCOUNTER — Encounter: Payer: Self-pay | Admitting: "Endocrinology

## 2024-02-25 VITALS — BP 128/66 | HR 80 | Ht 67.0 in | Wt 189.0 lb

## 2024-02-25 DIAGNOSIS — Z794 Long term (current) use of insulin: Secondary | ICD-10-CM | POA: Diagnosis not present

## 2024-02-25 DIAGNOSIS — E782 Mixed hyperlipidemia: Secondary | ICD-10-CM | POA: Diagnosis not present

## 2024-02-25 DIAGNOSIS — E113592 Type 2 diabetes mellitus with proliferative diabetic retinopathy without macular edema, left eye: Secondary | ICD-10-CM | POA: Diagnosis not present

## 2024-02-25 DIAGNOSIS — I10 Essential (primary) hypertension: Secondary | ICD-10-CM | POA: Diagnosis not present

## 2024-02-25 DIAGNOSIS — E559 Vitamin D deficiency, unspecified: Secondary | ICD-10-CM

## 2024-02-25 LAB — POCT GLYCOSYLATED HEMOGLOBIN (HGB A1C): HbA1c, POC (controlled diabetic range): 9.6 % — AB (ref 0.0–7.0)

## 2024-02-25 MED ORDER — EMPAGLIFLOZIN 10 MG PO TABS: 10.0000 mg | ORAL_TABLET | Freq: Every day | ORAL | 1 refills | Status: DC

## 2024-02-25 MED ORDER — INSULIN LISPRO PROT & LISPRO (75-25 MIX) 100 UNIT/ML KWIKPEN: 45.0000 [IU] | PEN_INJECTOR | Freq: Two times a day (BID) | SUBCUTANEOUS | 2 refills | Status: DC

## 2024-02-25 NOTE — Telephone Encounter (Signed)
 FYI Only or Action Required?: FYI only for provider.  Patient was last seen in primary care on 05/13/2023 by Tobie Suzzane POUR, MD.  Called Nurse Triage reporting Insect Bite and Facial Swelling.  Symptoms began several days ago.  Interventions attempted: OTC medications: Benadryl; and removed the stingers.  Symptoms are: 4-5 wasp stings to face/jaw/neck with itching and swelling gradually improving.  Triage Disposition: See Physician Within 24 Hours  Patient/caregiver understands and will follow disposition?: Yes                  Copied from CRM [id number]. Topic: Clinical - Red Word Triage >> Feb 25, 2024 10:33 AM Everette C wrote: [facility] that prompted transfer to Nurse Triage: The patient was stung in their left eye and face by wasps on Sunday 02/23/24 and is experiencing swelling and visual concerns Reason for Disposition  [1] Red or very tender (to touch) area AND [2] getting larger over 48 hours after the sting  Answer Assessment - Initial Assessment Questions Patient states he is mostly concerned about his eye. He states he called his ophthalmologist and he won't be in until next week. He was advised to call his PCP.   1. TYPE: What type of sting was it? (bee, yellow jacket, etc.)      Wasp.  2. ONSET: When did it occur?      Sunday 02/23/24.  3. LOCATION: Where is the sting located?  How many stings?     1 sting under left eye, 1 sting on nose, 1 sting on jaw, 1-2 in neck. Patient states he thinks he got all the stingers out.  4. SWELLING SIZE: How big is the swelling? (e.g., inches or cm)     He states when it initially happened it was as big as a golf ball, he states the swelling has gone down and is mostly just puffy now.  5. REDNESS: Is the area red or pink? If Yes, ask: What size is area of redness? (e.g., inches or cm). When did the redness start?     No.  6. PAIN: Is there any pain? If Yes, ask: How bad is it?  (Scale 1-10; or mild,  moderate, severe)     He states if he touches his left eye, it is painful.  7. ITCHING: Is there any itching? If Yes, ask: How bad is it?      Yes, mild.  8. RESPIRATORY DISTRESS: Describe your breathing.     Patient denies any difficulty breathing, wheezing or tightening of his airway at this time.  9. PRIOR REACTIONS: Have you had any severe allergic reactions to stings in the past? if yes, ask: What happened?    He states he has been stung in the past and did not have any severe allergic reactions.  10. OTHER SYMPTOMS: Do you have any other symptoms? (e.g., abdomen pain, face or tongue swelling, new rash elsewhere, vomiting)       Patient denies any difficulty swallowing or tongue swelling, new rash anywhere, vomiting.  11. PREGNANCY: Is there any chance you are pregnant? When was your last menstrual period?       N/A.  Protocols used: Bee or Yellow Jacket Sting-A-AH

## 2024-02-25 NOTE — Progress Notes (Signed)
 02/25/2024, 4:18 PM  Endocrinology follow-up note   Subjective:    Patient ID: Chad Kirk, male    DOB: 1961-08-18.  Erling Arrazola is being seen in follow-up after he was seen in consultation for management of currently uncontrolled symptomatic diabetes requested by  Tobie Suzzane POUR, MD.   Past Medical History:  Diagnosis Date   Diabetes mellitus without complication (HCC)    DIAGNOSED AGE 63   Diabetic ulcer of left great toe (HCC) 01/25/2021   Healed diabetic foot ulcer 02/22/2021   Hypertension     Past Surgical History:  Procedure Laterality Date   ESOPHAGEAL DILATION N/A 12/07/2019   Procedure: ESOPHAGEAL OR PYLORIC DILATION;  Surgeon: Harvey Margo CROME, MD;  Location: [facility];  Service: Endoscopy;  Laterality: N/A;   ESOPHAGOGASTRODUODENOSCOPY N/A 12/07/2019   erosive gastritis, many non-bleeding cratered and superficial duodenal ulcers without stigmata of bleeding in duodenal bulb. Empiric dilation due to possible occult esophageal web. Negative H.pylori.    EYE SURGERY  03/02/2020   diabetic retinopathy, lens placement, cataract removal, right eye   HERNIA REPAIR Left 1990    Social History   Socioeconomic History   Marital status: Married    Spouse name: Not on file   Number of children: Not on file   Years of education: Not on file   Highest education level: Not on file  Occupational History   Not on file  Tobacco Use   Smoking status: Former    Current packs/day: 0.00    Average packs/day: 1 pack/day for 5.0 years (5.0 ttl pk-yrs)    Types: Cigarettes    Start date: 08/21/1983    Quit date: 08/20/1988    Years since quitting: 35.5   Smokeless tobacco: Never  Vaping Use   Vaping status: Never Used  Substance and Sexual Activity   Alcohol use: Not Currently    Comment: denied 03/15/20   Drug use: No   Sexual activity: Not on file  Other Topics Concern   Not on file   Social History Narrative   Not on file   Social Drivers of Health   Financial Resource Strain: Not on file  Food Insecurity: Not on file  Transportation Needs: Not on file  Physical Activity: Not on file  Stress: Not on file  Social Connections: Not on file    Family History  Problem Relation Age of Onset   Stroke Mother    Hypertension Mother    Diabetes Mother    Hypertension Father    Diabetes Father    Colon cancer Neg Hx    Colon polyps Neg Hx     Outpatient Encounter Medications as of 02/25/2024  Medication Sig   empagliflozin  (JARDIANCE ) 10 MG TABS tablet Take 1 tablet (10 mg total) by mouth daily before breakfast.   Insulin  Pen Needle 29G X MISC Use to inject insulin  daily   amLODipine  (NORVASC ) 10 MG tablet Take 1 tablet (10 mg total) by mouth daily.   benzonatate  (TESSALON ) 200 MG capsule Take 1 capsule (200 mg total) by mouth 2 (two) times daily as needed for cough.  Blood Glucose Monitoring Suppl (ACCU-CHEK GUIDE ME) w/Device KIT 1 Piece by Does not apply route as directed.   Carboxymethylcellulose Sodium (LUBRICANT EYE DROPS OP) Apply 1 Container to eye as needed (dry eye). Left eye   Cholecalciferol (VITAMIN D3) 125 MCG (5000 UT) CAPS Take 1 capsule (5,000 Units total) by mouth daily.   Continuous Glucose Receiver (FREESTYLE LIBRE 2 READER) DEVI Use to test BG as directed. E11.65   Continuous Glucose Sensor (FREESTYLE LIBRE 2 SENSOR) MISC CHANGE SENSOR EVERY 14 DAYS   dorzolamide (TRUSOPT) 2 % ophthalmic solution Place 1 drop into the left eye 3 (three) times daily.   finasteride  (PROSCAR ) 5 MG tablet Take 1 tablet (5 mg total) by mouth daily.   gentamicin  cream (GARAMYCIN ) 0.1 % Apply to left heel once daily until healed.   glipiZIDE  (GLUCOTROL  XL) 5 MG 24 hr tablet Take 1 tablet (5 mg total) by mouth daily with breakfast.   glucose blood (ONETOUCH VERIO) test strip USE 1 STRIP TO CHECK GLUCOSE UP TO 2 TIMES DAILY   Insulin  Lispro Prot & Lispro (HUMALOG  MIX  75/25 KWIKPEN) (75-25) 100 UNIT/ML Kwikpen Inject 45 Units into the skin 2 (two) times daily with a meal.   Lancets (ONETOUCH DELICA PLUS LANCET33G) MISC Apply topically.   losartan  (COZAAR ) 100 MG tablet Take 1 tablet (100 mg total) by mouth daily.   Multiple Vitamins-Minerals (QC DAILY MULTIVIT/MULTIMINERAL PO) Take 1 tablet by mouth daily.   mupirocin  ointment (BACTROBAN ) 2 % Apply 1 Application topically daily.   prednisoLONE acetate (PRED FORTE) 1 % ophthalmic suspension 1 drop 4 (four) times daily.   Probiotic Product (PROBIOTIC PO) Take 1 tablet by mouth daily.   rosuvastatin  (CRESTOR ) 10 MG tablet Take 1 tablet (10 mg total) by mouth daily.   scopolamine  (TRANSDERM-SCOP) 1 MG/3DAYS Place 1 patch (1.5 mg total) onto the skin every 3 (three) days.   senna-docusate (SENOKOT-S) 8.6-50 MG tablet Take 1 tablet by mouth as needed.    sildenafil  (VIAGRA ) 100 MG tablet TAKE 1 TABLET BY MOUTH ONCE DAILY AS NEEDED FOR ERECTILE DYSFUNCTION   tamsulosin  (FLOMAX ) 0.4 MG CAPS capsule Take 1 capsule by mouth once daily   timolol (TIMOPTIC) 0.5 % ophthalmic solution Place 1 drop into both eyes 3 times daily.   UNABLE TO FIND Take 5 mLs by mouth every 4 (four) hours as needed. Med Name: Apothecary Cough Syrup Take 5 mL by mouth every 4 to 6 hours as needed for cough   [DISCONTINUED] Insulin  Lispro Prot & Lispro (HUMALOG  MIX 75/25 KWIKPEN) (75-25) 100 UNIT/ML Kwikpen Inject 40 Units into the skin 2 (two) times daily with a meal.   No facility-administered encounter medications on file as of 02/25/2024.    ALLERGIES: No Known Allergies  VACCINATION STATUS: Immunization History  Administered Date(s) Administered   Fluad Quad(high Dose 65+) 06/27/2021   Influenza, Seasonal, Injecte, Preservative Fre 05/13/2023   Influenza,inj,Quad PF,6+ Mos 05/29/2022   PFIZER(Purple Top)SARS-COV-2 Vaccination 10/17/2019, 11/07/2019   Pneumococcal Conjugate-13 08/08/2021   Pneumococcal Polysaccharide-23 06/30/2020    Tdap 02/03/2012, 08/30/2022   Zoster Recombinant(Shingrix) 10/05/2021, 11/23/2021    Diabetes He presents for his follow-up diabetic visit. He has type 2 diabetes mellitus. Onset time: He was diagnosed at approximate age of 39 years. His disease course has been fluctuating. Pertinent negatives for hypoglycemia include no confusion, headaches, nervousness/anxiousness, pallor, seizures, sweats or tremors. Pertinent negatives for diabetes include no blurred vision, no chest pain, no fatigue, no polydipsia, no polyphagia, no polyuria and no weakness. There  are no hypoglycemic complications. Symptoms are worsening. Diabetic complications include impotence, nephropathy, peripheral neuropathy, PVD and retinopathy. (He is reporting legal blindness from diabetic retinopathy on both eyes.) Risk factors for coronary artery disease include dyslipidemia, diabetes mellitus, family history, male sex, hypertension and sedentary lifestyle. Current diabetic treatments: He is currently on Lantus  15-20 units 1-2 times a day. He is compliant with treatment some of the time. His weight is increasing steadily. He is following a generally unhealthy diet. When asked about meal planning, he reported none. His home blood glucose trend is fluctuating minimally. His breakfast blood glucose range is generally 180-200 mg/dl. His lunch blood glucose range is generally 180-200 mg/dl. His dinner blood glucose range is generally 180-200 mg/dl. His bedtime blood glucose range is generally 180-200 mg/dl. His overall blood glucose range is 180-200 mg/dl. (Mr. Throgmorton presents with his CGM device showing 47% time in range, 32% level 1 hyperglycemia, 20% level 2 hide did not have hypoglycemia.  His average blood glucose 193 mg for the most recent 14 days.  His point of A1c is 9.6% unchanged from his last visit.    ) He does not see a podiatrist.Eye exam is current.  Hyperlipidemia This is a chronic problem. The current episode started more than 1  year ago. The problem is uncontrolled. Recent lipid tests were reviewed and are high. Exacerbating diseases include chronic renal disease and diabetes. Pertinent negatives include no chest pain, myalgias or shortness of breath. Current antihyperlipidemic treatment includes statins. Risk factors for coronary artery disease include diabetes mellitus, dyslipidemia, hypertension, male sex, a sedentary lifestyle and family history.  Hypertension This is a chronic problem. The current episode started more than 1 year ago. The problem is controlled. Pertinent negatives include no blurred vision, chest pain, headaches, neck pain, palpitations, shortness of breath or sweats. Risk factors for coronary artery disease include dyslipidemia, family history, diabetes mellitus, male gender and sedentary lifestyle. Past treatments include calcium  channel blockers. Hypertensive end-organ damage includes kidney disease, PVD and retinopathy. Identifiable causes of hypertension include chronic renal disease.     Objective:       02/25/2024    1:24 PM 10/24/2023   10:05 AM 09/25/2023   11:03 AM  Vitals with BMI  Height 5' 7 5' 7 5' 7  Weight 189 lbs 186 lbs 3 oz 181 lbs  BMI 29.59 29.16 28.34  Systolic 128 130   Diastolic 66 84   Pulse 80 92     BP 128/66   Pulse 80   Ht 5' 7 (1.702 m)   Wt 189 lb (85.7 kg)   BMI 29.60 kg/m   Wt Readings from Last 3 Encounters:  02/25/24 189 lb (85.7 kg)  10/24/23 186 lb 3.2 oz (84.5 kg)  09/25/23 181 lb (82.1 kg)       CMP ( most recent) CMP     Component Value Date/Time   NA 138 08/26/2023 1016   K 4.9 08/26/2023 1016   CL 103 08/26/2023 1016   CO2 22 08/26/2023 1016   GLUCOSE 264 (H) 08/26/2023 1016   GLUCOSE 256 (H) 12/30/2019 1218   BUN 22 08/26/2023 1016   CREATININE 1.33 (H) 08/26/2023 1016   CALCIUM  9.1 08/26/2023 1016   PROT 7.0 08/26/2023 1016   ALBUMIN 4.1 08/26/2023 1016   AST 24 08/26/2023 1016   ALT 33 08/26/2023 1016   ALKPHOS 75  08/26/2023 1016   BILITOT 0.3 08/26/2023 1016   GFRNONAA >60 12/30/2019 1218   GFRAA >60 12/30/2019  1218     Diabetic Labs (most recent): Lab Results  Component Value Date   HGBA1C 9.6 (A) 02/25/2024   HGBA1C 9.8 (A) 08/30/2023   HGBA1C 8.8 (A) 04/26/2023     Lipid Panel ( most recent) Lipid Panel     Component Value Date/Time   CHOL 137 08/26/2023 1016   TRIG 62 08/26/2023 1016   HDL 42 08/26/2023 1016   CHOLHDL 3.3 08/26/2023 1016   CHOLHDL 4.4 12/30/2019 1218   VLDL 15 12/30/2019 1218   LDLCALC 82 08/26/2023 1016   LABVLDL 13 08/26/2023 1016      Assessment & Plan:   1. Type 2 diabetes, uncontrolled, with retinopathy with macular edema (HCC)  - Karver Fadden has currently uncontrolled symptomatic type 2 DM since  63 years of age.  Mr. Chagoya presents with his CGM device showing 47% time in range, 32% level 1 hyperglycemia, 20% level 2 hide did not have hypoglycemia.  His average blood glucose 193 mg for the most recent 14 days.  His point of A1c is 9.6% unchanged from his last visit.    - I had a long discussion with him about the progressive nature of diabetes and the pathology behind its complications. -his diabetes is complicated by retinopathy, peripheral arterial disease, peripheral neuropathy and he remains at a high risk for more acute and chronic complications which include CAD, CVA, CKD, retinopathy, and neuropathy. These are all discussed in detail with him.  -He did not engage optimally, however he promises to do better on last admission. - he acknowledges that there is a room for improvement in his food and drink choices. - Suggestion is made for him to avoid simple carbohydrates  from his diet including Cakes, Sweet Desserts, Ice Cream, Soda (diet and regular), Sweet Tea, Candies, Chips, Cookies, Store Bought Juices, Alcohol , Artificial Sweeteners,  Coffee Creamer, and Sugar-free Products, Lemonade. This will help patient to have more stable blood glucose  profile and potentially avoid unintended weight gain.  The following Lifestyle Medicine recommendations according to American College of Lifestyle Medicine  [facility]) were discussed and and offered to patient and he  agrees to start the journey:  A. Whole Foods, Plant-Based Nutrition comprising of fruits and vegetables, plant-based proteins, whole-grain carbohydrates was discussed in detail with the patient.   A list for source of those nutrients were also provided to the patient.  Patient will use only water  or unsweetened tea for hydration. B.  The need to stay away from risky substances including alcohol, smoking; obtaining 7 to 9 hours of restorative sleep, at least 150 minutes of moderate intensity exercise weekly, the importance of healthy social connections,  and stress management techniques were discussed. C.  A full color page of  Calorie density of various food groups per pound showing examples of each food groups was provided to the patient.   He is following with [name] for DE. - I have approached him with the following individualized plan to manage  his diabetes and patient agrees:   -In light of his presentation with significantly above target glycemic profile, he will continue treatment with daily injections of insulin  in order for him to control diabetes to target.  Accordingly, he is advised to increase Humalog  75/25 to 45  units with breakfast and 45 units with supper  only when Premeal blood glucose is above 90 mg per DL and only when he is eating.    -He is advised to utilize his CGM continuously.  He is encouraged to call clinic for hypoglycemia below 70 or hyperglycemia above 200 mg per DL.   -He did not obtain his SGLT2 inhibitors prescription filled for unclear reasons.   He is advised to resume his glipizide  5 mg XL p.o. daily at breakfast.  - he is encouraged to call clinic for blood glucose levels less than 70 or above 200 mg /dl.  - he is warned not to take  insulin  without proper monitoring per orders. - This patient will benefit from an SGLT2 inhibitor intervention.  I discussed prescribed Jardiance  10 mg p.o. daily at breakfast.  Side effects and precautions discussed with him.  - Specific targets for  A1c;  LDL, HDL,  and Triglycerides were discussed with the patient.  2) Blood Pressure /Hypertension: Blood pressure is controlled to target. he is advised to continue his current medications including amlodipine  10 mg p.o.. daily with breakfast .  He will be considered for HCTZ/lisinopril next visit.   3) Lipids/Hyperlipidemia:   -His recent lipid panel showed near target LDL at 82.  He is advised to continue Crestor  10 mg p.o. daily at bedtime.   side effects and precautions discussed with him.     4)  Weight/Diet:  Body mass index is 29.6 kg/m.  -   he is not a candidate for major weight loss. Exercise, and detailed carbohydrates information provided  -  detailed on discharge instructions.  5) Chronic Care/Health Maintenance:  -he  is on  Statin medications and  is encouraged to initiate and continue to follow up with Ophthalmology, Dentist,  Podiatrist at least yearly or according to recommendations, and advised to   stay away from smoking. I have recommended yearly flu vaccine and pneumonia vaccine at least every 5 years; moderate intensity exercise for up to 150 minutes weekly; and  sleep for at least 7 hours a day.  6) vitamin D  deficiency: He is advised to continue his vitamin D3 5 thyroid units daily.       His next study is due in September 2027, or sooner if needed.  - he is  advised to maintain close follow up with his pcp for primary care needs, as well as his other providers for optimal and coordinated care   I spent  41  minutes in the care of the patient today including review of labs from CMP, Lipids, Thyroid Function, Hematology (current and previous including abstractions from other facilities); face-to-face time  discussing  his blood glucose readings/logs, discussing hypoglycemia and hyperglycemia episodes and symptoms, medications doses, his options of short and long term treatment based on the latest standards of care / guidelines;  discussion about incorporating lifestyle medicine;  and documenting the encounter. Risk reduction counseling performed per USPSTF guidelines to reduce  cardiovascular risk factors.     Please refer to Patient Instructions for Blood Glucose Monitoring and Insulin /Medications Dosing Guide  in media tab for additional information. Please  also refer to  Patient Self Inventory in the Media  tab for reviewed elements of pertinent patient history.  Dasie Moats participated in the discussions, expressed understanding, and voiced agreement with the above plans.  All questions were answered to his satisfaction. he is encouraged to contact clinic should he have any questions or concerns prior to his return visit.   Follow up plan: - Return in about 4 months (around 06/27/2024) for F/U with Pre-visit Labs, Meter/CGM/Logs, A1c here.  Ranny Earl, MD [facility] [address]  [address] [place], [place] [address] Phone: [phone]  Fax: [phone]    02/25/2024, 4:18 PM  This note was partially dictated with voice recognition software. Similar sounding words can be transcribed inadequately or may not  be corrected upon review.

## 2024-02-25 NOTE — Patient Instructions (Signed)

## 2024-02-25 NOTE — Telephone Encounter (Signed)
 noted

## 2024-02-26 ENCOUNTER — Ambulatory Visit (INDEPENDENT_AMBULATORY_CARE_PROVIDER_SITE_OTHER): Admitting: Internal Medicine

## 2024-02-26 ENCOUNTER — Encounter: Payer: Self-pay | Admitting: Internal Medicine

## 2024-02-26 ENCOUNTER — Telehealth: Payer: Self-pay | Admitting: "Endocrinology

## 2024-02-26 VITALS — BP 129/80 | HR 90 | Ht 67.0 in | Wt 187.4 lb

## 2024-02-26 DIAGNOSIS — Z91030 Bee allergy status: Secondary | ICD-10-CM | POA: Diagnosis not present

## 2024-02-26 DIAGNOSIS — N400 Enlarged prostate without lower urinary tract symptoms: Secondary | ICD-10-CM

## 2024-02-26 DIAGNOSIS — E113592 Type 2 diabetes mellitus with proliferative diabetic retinopathy without macular edema, left eye: Secondary | ICD-10-CM

## 2024-02-26 DIAGNOSIS — Z794 Long term (current) use of insulin: Secondary | ICD-10-CM

## 2024-02-26 DIAGNOSIS — I10 Essential (primary) hypertension: Secondary | ICD-10-CM

## 2024-02-26 DIAGNOSIS — N529 Male erectile dysfunction, unspecified: Secondary | ICD-10-CM | POA: Diagnosis not present

## 2024-02-26 MED ORDER — TAMSULOSIN HCL 0.4 MG PO CAPS: 0.4000 mg | ORAL_CAPSULE | Freq: Every day | ORAL | 3 refills | Status: AC

## 2024-02-26 MED ORDER — METHYLPREDNISOLONE 4 MG PO TBPK: ORAL_TABLET | ORAL | 0 refills | Status: DC

## 2024-02-26 MED ORDER — SILDENAFIL CITRATE 100 MG PO TABS
100.0000 mg | ORAL_TABLET | Freq: Every day | ORAL | 1 refills | Status: DC | PRN
Start: 2024-02-26 — End: 2024-04-10

## 2024-02-26 NOTE — Assessment & Plan Note (Signed)
Takes Sildenafil 100 mg PRN

## 2024-02-26 NOTE — Assessment & Plan Note (Signed)
 Lab Results  Component Value Date   HGBA1C 9.6 (A) 02/25/2024   Uncontrolled Needs to take Humalog  45 Units twice daily and Glipizide  5 mg QD regularly F/u with Dr Lenis - last visit note reviewed Advised to follow diabetic diet On statin F/u CMP and lipid panel Diabetic eye exam: Advised to continue to follow up with Ophthalmology for diabetic eye exam

## 2024-02-26 NOTE — Progress Notes (Signed)
 Established Patient Office Visit  Subjective:  Patient ID: Chad Kirk, male    DOB: 1961-04-16  Age: 63 y.o. MRN: [id number]  CC:  Chief Complaint  Patient presents with   Insect Bite    Pt reports a sting from a wasp on Sunday, sx of itchiness and puffiness.     HPI Chad Kirk is a 63 y.o. male with past medical history of diabetes mellitus type 2, PVD, hypertension, hyperlipidemia, diabetic macular edema, BPH and alcohol abuse who presents for f/u of his chronic medical conditions.  He reports bee stings over left side of his face and neck area on 02/23/24.  He had swelling of the face, and has been taking Benadryl at nighttime with mild relief.  He still has swelling of the face and itching over the area.  He has periorbital edema as well.  Denies lip swelling, dyspnea or wheezing currently.  HTN: BP is wnl today. Takes medications regularly - Losartan  100 mg QD and amlodipine  10 mg QD. Patient denies headache, dizziness, chest pain, dyspnea or palpitations.   DM: He follows up with Dr. Lenis for diabetes management.  He takes Humalog  75/25 - 45 U BID and glipizide  5 mg QD. He is advised to follow insulin  regimen and diabetic diet as instructed. He follows up with ophthalmologist for diabetic macular edema and his vision has improved now. Denies any polyuria, polyphagia or fatigue.  BPH: Takes Flomax .  He recently had US  of renal, which showed postvoid residual of 331 cc.  Denies any dysuria or hematuria.  Proteinuria: He has seen Dr. Rachele for it. He has had 24-hour urine protein, takes Losartan . Has been placed on Jardiance  recently.   Past Medical History:  Diagnosis Date   Diabetes mellitus without complication (HCC)    DIAGNOSED AGE 89   Diabetic ulcer of left great toe (HCC) 01/25/2021   Healed diabetic foot ulcer 02/22/2021   Hypertension     Past Surgical History:  Procedure Laterality Date   ESOPHAGEAL DILATION N/A 12/07/2019   Procedure: ESOPHAGEAL OR PYLORIC  DILATION;  Surgeon: Harvey Margo CROME, MD;  Location: [facility];  Service: Endoscopy;  Laterality: N/A;   ESOPHAGOGASTRODUODENOSCOPY N/A 12/07/2019   erosive gastritis, many non-bleeding cratered and superficial duodenal ulcers without stigmata of bleeding in duodenal bulb. Empiric dilation due to possible occult esophageal web. Negative H.pylori.    EYE SURGERY  03/02/2020   diabetic retinopathy, lens placement, cataract removal, right eye   HERNIA REPAIR Left 1990    Family History  Problem Relation Age of Onset   Stroke Mother    Hypertension Mother    Diabetes Mother    Hypertension Father    Diabetes Father    Colon cancer Neg Hx    Colon polyps Neg Hx     Social History   Socioeconomic History   Marital status: Married    Spouse name: Not on file   Number of children: Not on file   Years of education: Not on file   Highest education level: Not on file  Occupational History   Not on file  Tobacco Use   Smoking status: Former    Current packs/day: 0.00    Average packs/day: 1 pack/day for 5.0 years (5.0 ttl pk-yrs)    Types: Cigarettes    Start date: 08/21/1983    Quit date: 08/20/1988    Years since quitting: 35.5   Smokeless tobacco: Never  Vaping Use   Vaping status: Never Used  Substance and  Sexual Activity   Alcohol use: Not Currently    Comment: denied 03/15/20   Drug use: No   Sexual activity: Not on file  Other Topics Concern   Not on file  Social History Narrative   Not on file   Social Drivers of Health   Financial Resource Strain: Not on file  Food Insecurity: Not on file  Transportation Needs: Not on file  Physical Activity: Not on file  Stress: Not on file  Social Connections: Not on file  Intimate Partner Violence: Not on file    Outpatient Medications Prior to Visit  Medication Sig Dispense Refill   amLODipine  (NORVASC ) 10 MG tablet Take 1 tablet (10 mg total) by mouth daily. 90 tablet 3   benzonatate  (TESSALON ) 200 MG capsule Take 1  capsule (200 mg total) by mouth 2 (two) times daily as needed for cough. 30 capsule 1   Blood Glucose Monitoring Suppl (ACCU-CHEK GUIDE ME) w/Device KIT 1 Piece by Does not apply route as directed. 1 kit 0   Carboxymethylcellulose Sodium (LUBRICANT EYE DROPS OP) Apply 1 Container to eye as needed (dry eye). Left eye     Cholecalciferol (VITAMIN D3) 125 MCG (5000 UT) CAPS Take 1 capsule (5,000 Units total) by mouth daily. 90 capsule 1   Continuous Glucose Receiver (FREESTYLE LIBRE 2 READER) DEVI Use to test BG as directed. E11.65 1 each 0   Continuous Glucose Sensor (FREESTYLE LIBRE 2 SENSOR) MISC CHANGE SENSOR EVERY 14 DAYS 6 each 0   dorzolamide (TRUSOPT) 2 % ophthalmic solution Place 1 drop into the left eye 3 (three) times daily.     empagliflozin  (JARDIANCE ) 10 MG TABS tablet Take 1 tablet (10 mg total) by mouth daily before breakfast. 90 tablet 1   finasteride  (PROSCAR ) 5 MG tablet Take 1 tablet (5 mg total) by mouth daily. 90 tablet 3   gentamicin  cream (GARAMYCIN ) 0.1 % Apply to left heel once daily until healed. 30 g 0   glipiZIDE  (GLUCOTROL  XL) 5 MG 24 hr tablet Take 1 tablet (5 mg total) by mouth daily with breakfast. 90 tablet 3   glucose blood (ONETOUCH VERIO) test strip USE 1 STRIP TO CHECK GLUCOSE UP TO 2 TIMES DAILY 100 each 2   Insulin  Lispro Prot & Lispro (HUMALOG  MIX 75/25 KWIKPEN) (75-25) 100 UNIT/ML Kwikpen Inject 45 Units into the skin 2 (two) times daily with a meal. 45 mL 2   Insulin  Pen Needle 29G X MISC Use to inject insulin  daily 100 each 1   Lancets (ONETOUCH DELICA PLUS LANCET33G) MISC Apply topically.     losartan  (COZAAR ) 100 MG tablet Take 1 tablet (100 mg total) by mouth daily. 90 tablet 3   Multiple Vitamins-Minerals (QC DAILY MULTIVIT/MULTIMINERAL PO) Take 1 tablet by mouth daily.     mupirocin  ointment (BACTROBAN ) 2 % Apply 1 Application topically daily. 22 g 0   prednisoLONE acetate (PRED FORTE) 1 % ophthalmic suspension 1 drop 4 (four) times daily.      Probiotic Product (PROBIOTIC PO) Take 1 tablet by mouth daily.     rosuvastatin  (CRESTOR ) 10 MG tablet Take 1 tablet (10 mg total) by mouth daily. 90 tablet 3   scopolamine  (TRANSDERM-SCOP) 1 MG/3DAYS Place 1 patch (1.5 mg total) onto the skin every 3 (three) days. 10 patch 0   senna-docusate (SENOKOT-S) 8.6-50 MG tablet Take 1 tablet by mouth as needed.      timolol (TIMOPTIC) 0.5 % ophthalmic solution Place 1 drop into both eyes 3 times  daily.     UNABLE TO FIND Take 5 mLs by mouth every 4 (four) hours as needed. Med Name: Apothecary Cough Syrup Take 5 mL by mouth every 4 to 6 hours as needed for cough 120 mL 0   sildenafil  (VIAGRA ) 100 MG tablet TAKE 1 TABLET BY MOUTH ONCE DAILY AS NEEDED FOR ERECTILE DYSFUNCTION 15 tablet 0   tamsulosin  (FLOMAX ) 0.4 MG CAPS capsule Take 1 capsule by mouth once daily 90 capsule 0   No facility-administered medications prior to visit.    No Known Allergies  ROS Review of Systems  Constitutional:  Negative for chills and fever.  HENT:  Negative for congestion and sore throat.   Eyes:  Positive for visual disturbance. Negative for pain and discharge.  Respiratory:  Negative for cough and shortness of breath.   Cardiovascular:  Negative for chest pain and palpitations.  Gastrointestinal:  Negative for diarrhea, nausea and vomiting.  Endocrine: Negative for polydipsia and polyuria.  Genitourinary:  Negative for dysuria and hematuria.  Musculoskeletal:  Negative for neck pain and neck stiffness.  Skin:  Negative for rash.       Facial swelling  Neurological:  Negative for dizziness and weakness.  Psychiatric/Behavioral:  Negative for agitation and behavioral problems.       Objective:    Physical Exam Vitals reviewed.  Constitutional:      General: He is not in acute distress.    Appearance: He is not diaphoretic.  HENT:     Head: Normocephalic and atraumatic.     Nose: Nose normal.     Mouth/Throat:     Mouth: Mucous membranes are moist.      Comments: Left sided facial swelling Eyes:     General: No scleral icterus.    Extraocular Movements: Extraocular movements intact.  Cardiovascular:     Rate and Rhythm: Normal rate and regular rhythm.     Heart sounds: Normal heart sounds. No murmur heard. Pulmonary:     Breath sounds: Normal breath sounds. No wheezing or rales.  Musculoskeletal:     Cervical back: Neck supple. No tenderness.     Right lower leg: No edema.     Left lower leg: No edema.  Skin:    General: Skin is warm.     Findings: No rash.  Neurological:     General: No focal deficit present.     Mental Status: He is alert and oriented to person, place, and time.     Sensory: No sensory deficit.     Motor: No weakness.  Psychiatric:        Mood and Affect: Mood normal.        Behavior: Behavior normal.     BP 129/80   Pulse 90   Ht 5' 7 (1.702 m)   Wt 187 lb 6.4 oz (85 kg)   SpO2 95%   BMI 29.35 kg/m  Wt Readings from Last 3 Encounters:  02/26/24 187 lb 6.4 oz (85 kg)  02/25/24 189 lb (85.7 kg)  10/24/23 186 lb 3.2 oz (84.5 kg)    Lab Results  Component Value Date   TSH 2.010 12/14/2022   Lab Results  Component Value Date   WBC 6.1 12/12/2021   HGB 14.0 12/12/2021   HCT 42.9 12/12/2021   MCV 81 12/12/2021   PLT 214 12/12/2021   Lab Results  Component Value Date   NA 138 08/26/2023   K 4.9 08/26/2023   CO2 22 08/26/2023   GLUCOSE 264 (H)  08/26/2023   BUN 22 08/26/2023   CREATININE 1.33 (H) 08/26/2023   BILITOT 0.3 08/26/2023   ALKPHOS 75 08/26/2023   AST 24 08/26/2023   ALT 33 08/26/2023   PROT 7.0 08/26/2023   ALBUMIN 4.1 08/26/2023   CALCIUM  9.1 08/26/2023   ANIONGAP 8 12/30/2019   EGFR 60 08/26/2023   Lab Results  Component Value Date   CHOL 137 08/26/2023   Lab Results  Component Value Date   HDL 42 08/26/2023   Lab Results  Component Value Date   LDLCALC 82 08/26/2023   Lab Results  Component Value Date   TRIG 62 08/26/2023   Lab Results  Component Value  Date   CHOLHDL 3.3 08/26/2023   Lab Results  Component Value Date   HGBA1C 9.6 (A) 02/25/2024      Assessment & Plan:   Problem List Items Addressed This Visit       Cardiovascular and Mediastinum   Essential hypertension   BP Readings from Last 1 Encounters:  02/26/24 129/80   Well-controlled with Losartan  100 mg daily QD and amlodipine  10 mg QD Used to take Lasix PRN for leg swelling, now improved Counseled for compliance with the medications Advised DASH diet and moderate exercise/walking, at least 150 mins/week      Relevant Medications   sildenafil  (VIAGRA ) 100 MG tablet     Endocrine   Type 2 diabetes mellitus with ophthalmic complication (HCC) - Primary   Lab Results  Component Value Date   HGBA1C 9.6 (A) 02/25/2024   Uncontrolled Needs to take Humalog  45 Units twice daily and Glipizide  5 mg QD regularly F/u with Dr Lenis - last visit note reviewed Advised to follow diabetic diet On statin F/u CMP and lipid panel Diabetic eye exam: Advised to continue to follow up with Ophthalmology for diabetic eye exam        Genitourinary   BPH (benign prostatic hyperplasia)   On Flomax , refilled Refilled Finasteride  Followed by Urology      Relevant Medications   tamsulosin  (FLOMAX ) 0.4 MG CAPS capsule     Other   Erectile dysfunction   Takes Sildenafil  100 mg PRN      Relevant Medications   sildenafil  (VIAGRA ) 100 MG tablet   Bee sting allergy   Facial swelling, slightly improved with Benadryl Medrol  Dosepak prescribed - advised to take insulin  regularly, and contact if blood glucose more than 300 As he did not have angioedema like reaction, did not prescribe EpiPen for now      Relevant Medications   methylPREDNISolone  (MEDROL  DOSEPAK) 4 MG TBPK tablet     Meds ordered this encounter  Medications   methylPREDNISolone  (MEDROL  DOSEPAK) 4 MG TBPK tablet    Sig: Take as package instructions.    Dispense:  1 each    Refill:  0   tamsulosin  (FLOMAX )  0.4 MG CAPS capsule    Sig: Take 1 capsule (0.4 mg total) by mouth daily.    Dispense:  90 capsule    Refill:  3   sildenafil  (VIAGRA ) 100 MG tablet    Sig: Take 1 tablet (100 mg total) by mouth daily as needed for erectile dysfunction.    Dispense:  20 tablet    Refill:  1    Follow-up: Return in about 3 months (around 05/28/2024) for HTN.    Suzzane MARLA Blanch, MD

## 2024-02-26 NOTE — Telephone Encounter (Signed)
 Pt wants to know if he needs to take the Jardiance  and insulin  together and if so how much of each.  Pt is in the office now

## 2024-02-26 NOTE — Assessment & Plan Note (Signed)
 BP Readings from Last 1 Encounters:  02/26/24 129/80   Well-controlled with Losartan  100 mg daily QD and amlodipine  10 mg QD Used to take Lasix PRN for leg swelling, now improved Counseled for compliance with the medications Advised DASH diet and moderate exercise/walking, at least 150 mins/week

## 2024-02-26 NOTE — Patient Instructions (Addendum)
 Please start taking Prednisone as prescribed. Please continue taking Benadryl for itching.  Please continue to take medications as prescribed.  Please continue to follow low carb diet and perform moderate exercise/walking at least 150 mins/week.

## 2024-02-26 NOTE — Assessment & Plan Note (Addendum)
 Facial swelling, slightly improved with Benadryl Medrol  Dosepak prescribed - advised to take insulin  regularly, and contact if blood glucose more than 300 As he did not have angioedema like reaction, did not prescribe EpiPen for now

## 2024-02-26 NOTE — Telephone Encounter (Signed)
 Reviewed with pt to take his Glipizide  XL 5mg  daily with breakfast, also take Jardiance  10mg  daily with breakfast. Discussed instructions for his insulin , Humalog  75/25 45 units with breakfast and 45 units with supper when glucose is above 90 and pt is eating. Pt voiced understanding.

## 2024-02-26 NOTE — Assessment & Plan Note (Signed)
 On Flomax , refilled Refilled Finasteride  Followed by Urology

## 2024-03-02 ENCOUNTER — Ambulatory Visit: Payer: Self-pay | Admitting: Internal Medicine

## 2024-04-07 ENCOUNTER — Other Ambulatory Visit: Payer: Self-pay | Admitting: "Endocrinology

## 2024-04-10 ENCOUNTER — Other Ambulatory Visit: Payer: Self-pay | Admitting: Internal Medicine

## 2024-04-10 DIAGNOSIS — N529 Male erectile dysfunction, unspecified: Secondary | ICD-10-CM

## 2024-04-27 ENCOUNTER — Ambulatory Visit (INDEPENDENT_AMBULATORY_CARE_PROVIDER_SITE_OTHER)

## 2024-04-27 ENCOUNTER — Encounter: Payer: Self-pay | Admitting: Podiatry

## 2024-04-27 ENCOUNTER — Ambulatory Visit (INDEPENDENT_AMBULATORY_CARE_PROVIDER_SITE_OTHER): Admitting: Podiatry

## 2024-04-27 DIAGNOSIS — M79674 Pain in right toe(s): Secondary | ICD-10-CM

## 2024-04-27 DIAGNOSIS — L84 Corns and callosities: Secondary | ICD-10-CM

## 2024-04-27 DIAGNOSIS — B351 Tinea unguium: Secondary | ICD-10-CM | POA: Diagnosis not present

## 2024-04-27 DIAGNOSIS — M79675 Pain in left toe(s): Secondary | ICD-10-CM

## 2024-04-27 DIAGNOSIS — E1151 Type 2 diabetes mellitus with diabetic peripheral angiopathy without gangrene: Secondary | ICD-10-CM

## 2024-04-27 NOTE — Progress Notes (Signed)
    Subjective:  Patient ID: Chad Kirk, male    DOB: August 19, 1961,  MRN: [id number]  Chad Kirk presents to clinic today for:  Chief Complaint  Patient presents with   Foot Ulcer    [facility] and check L Great toe ulcer. No pain reported with shoes  A1c 8.9 no anti coag.   Patient notes nails are thick and elongated, causing pain in shoe gear when ambulating.  He has preulcerative calluses of the great toe joint bilateral.  PCP is Tobie Suzzane POUR, MD. last seen on 02/26/2024  Past Medical History:  Diagnosis Date   Diabetes mellitus without complication (HCC)    DIAGNOSED AGE 78   Diabetic ulcer of left great toe (HCC) 01/25/2021   Healed diabetic foot ulcer 02/22/2021   Hypertension    No Known Allergies  Objective:  Chad Kirk is a pleasant 63 y.o. male in NAD. AAO x 3.  Vascular Examination: Patient has palpable DP pulse, absent PT pulse bilateral.  Delayed capillary refill bilateral toes.  Sparse digital hair bilateral.  Proximal to distal cooling WNL bilateral.    Dermatological Examination: Interspaces are clear with no open lesions noted bilateral.  Skin is shiny and atrophic bilateral.  Nails are 3-21mm thick, with yellowish/brown discoloration, subungual debris and distal onycholysis x10.  There is pain with compression of nails x10.  There are hyperkeratotic lesions noted on the plantar medial aspect of the first MPJ bilateral.     Latest Ref Rng & Units 02/25/2024    1:42 PM 08/30/2023    9:02 AM  Hemoglobin A1C  Hemoglobin-A1c 0.0 - 7.0 % 9.6  9.8    Patient qualifies for at-risk foot care because of diabetes and PVD.  Assessment/Plan: 1. Pre-ulcerative calluses   2. Pain due to onychomycosis of toenails of both feet   3. Type II diabetes mellitus with peripheral circulatory disorder (HCC)     Mycotic nails x10 were sharply debrided with sterile nail nippers and power debriding burr to decrease bulk and length.  Hyperkeratotic lesions x 2 were shaved with #312 blade.   Location are noted above in the dermatological examination  Return in about 3 months (around 07/27/2024) for [facility].   Awanda CHARM Imperial, DPM, FACFAS [facility]     [address] [place], [place] [address]                Office [phone]  Fax [phone]

## 2024-05-05 ENCOUNTER — Other Ambulatory Visit: Payer: Self-pay | Admitting: *Deleted

## 2024-05-05 ENCOUNTER — Other Ambulatory Visit: Payer: Self-pay | Admitting: "Endocrinology

## 2024-05-05 DIAGNOSIS — E113592 Type 2 diabetes mellitus with proliferative diabetic retinopathy without macular edema, left eye: Secondary | ICD-10-CM

## 2024-05-05 DIAGNOSIS — Z794 Long term (current) use of insulin: Secondary | ICD-10-CM

## 2024-05-05 MED ORDER — FREESTYLE LIBRE 3 READER DEVI
1.0000 | 0 refills | Status: AC
Start: 2024-05-05 — End: ?

## 2024-05-05 MED ORDER — FREESTYLE LIBRE 3 PLUS SENSOR MISC
6.0000 | 2 refills | Status: AC
Start: 2024-05-05 — End: ?

## 2024-05-05 NOTE — Telephone Encounter (Signed)
 Patient said he wants to go ahead and get the Addison 3

## 2024-05-16 ENCOUNTER — Other Ambulatory Visit: Payer: Self-pay | Admitting: "Endocrinology

## 2024-05-16 ENCOUNTER — Other Ambulatory Visit: Payer: Self-pay | Admitting: Internal Medicine

## 2024-05-16 DIAGNOSIS — E113592 Type 2 diabetes mellitus with proliferative diabetic retinopathy without macular edema, left eye: Secondary | ICD-10-CM

## 2024-05-16 DIAGNOSIS — N400 Enlarged prostate without lower urinary tract symptoms: Secondary | ICD-10-CM

## 2024-05-16 DIAGNOSIS — E782 Mixed hyperlipidemia: Secondary | ICD-10-CM

## 2024-05-16 DIAGNOSIS — I10 Essential (primary) hypertension: Secondary | ICD-10-CM

## 2024-05-24 ENCOUNTER — Other Ambulatory Visit: Payer: Self-pay | Admitting: Internal Medicine

## 2024-05-24 DIAGNOSIS — N529 Male erectile dysfunction, unspecified: Secondary | ICD-10-CM

## 2024-05-26 ENCOUNTER — Ambulatory Visit (INDEPENDENT_AMBULATORY_CARE_PROVIDER_SITE_OTHER): Admitting: Podiatry

## 2024-05-26 DIAGNOSIS — E08621 Diabetes mellitus due to underlying condition with foot ulcer: Secondary | ICD-10-CM | POA: Diagnosis not present

## 2024-05-26 DIAGNOSIS — L97501 Non-pressure chronic ulcer of other part of unspecified foot limited to breakdown of skin: Secondary | ICD-10-CM

## 2024-05-26 DIAGNOSIS — L97521 Non-pressure chronic ulcer of other part of left foot limited to breakdown of skin: Secondary | ICD-10-CM | POA: Diagnosis not present

## 2024-05-26 MED ORDER — MUPIROCIN 2 % EX OINT: 1.0000 | TOPICAL_OINTMENT | Freq: Two times a day (BID) | CUTANEOUS | 2 refills | Status: AC

## 2024-05-26 NOTE — Progress Notes (Signed)
 Patient presents with a complaint of blister and drainage from the great toe on the left.  Does not remember doing anything he was doing a large JAP a lot of yard work and noticed it was painful and looked at it.  No fever or chills or nausea or vomiting.  Has never had any ulceration there before.  Physical Exam:  Patient alert and oriented x 3.  No complaints of nausea, vomiting, fever, or chills  Vascular: DP pulses 2/4 bilateral. PT pulses 0/4 lateral. Mild edema. Capillary fill time immediate bilaterally.  Dermatologic: Superficial ulcer just penetrating into the skin on the lateral aspect of the hallux left.  Clear serous drainage.  No signs of infection.  No purulence noted.. Measures 21 mm wide x 13 mm long x 1 mm deep.   Neurologic: Sensation intact hallux left  Musculoskeletal: Mild to moderate hallux valgus deformity left.  Hammertoe second toe left   Diagnoses: 1.  Superficial great Wagner grade 1 ulceration lateral hallux left  Plan: -Established office visit for evaluation and management level 3 - Discussed the ulceration on the hallux and etiology and treatment.  Probably is causing part by the second toe rubbing on the great toe is a hammertoe with HAV deformity.  Watch for any signs of infection. -Lightly debrided the ulcer of the devitalized tissue and removed any loose skin.  Applied antibiotic ointment and a dressing -Wound care soak in warm Epsom salt water  twice daily for 15 minutes, apply Bactroban  ointment, and a light dressing -Rx Bactroban  ointment apply twice daily to wound foot.  Return 2 weeks f/u ulcer left

## 2024-06-03 ENCOUNTER — Encounter: Payer: Self-pay | Admitting: Internal Medicine

## 2024-06-03 ENCOUNTER — Ambulatory Visit: Admitting: Internal Medicine

## 2024-06-03 ENCOUNTER — Ambulatory Visit (INDEPENDENT_AMBULATORY_CARE_PROVIDER_SITE_OTHER): Admitting: Internal Medicine

## 2024-06-03 VITALS — BP 133/82 | HR 83 | Ht 69.0 in | Wt 178.0 lb

## 2024-06-03 DIAGNOSIS — Z23 Encounter for immunization: Secondary | ICD-10-CM | POA: Diagnosis not present

## 2024-06-03 DIAGNOSIS — Z794 Long term (current) use of insulin: Secondary | ICD-10-CM

## 2024-06-03 DIAGNOSIS — Z0001 Encounter for general adult medical examination with abnormal findings: Secondary | ICD-10-CM

## 2024-06-03 DIAGNOSIS — N400 Enlarged prostate without lower urinary tract symptoms: Secondary | ICD-10-CM

## 2024-06-03 DIAGNOSIS — E113592 Type 2 diabetes mellitus with proliferative diabetic retinopathy without macular edema, left eye: Secondary | ICD-10-CM | POA: Diagnosis not present

## 2024-06-03 DIAGNOSIS — I10 Essential (primary) hypertension: Secondary | ICD-10-CM

## 2024-06-03 DIAGNOSIS — E559 Vitamin D deficiency, unspecified: Secondary | ICD-10-CM

## 2024-06-03 DIAGNOSIS — Z1211 Encounter for screening for malignant neoplasm of colon: Secondary | ICD-10-CM | POA: Insufficient documentation

## 2024-06-03 MED ORDER — AMLODIPINE BESYLATE 10 MG PO TABS: 10.0000 mg | ORAL_TABLET | Freq: Every day | ORAL | 3 refills | Status: AC

## 2024-06-03 MED ORDER — LOSARTAN POTASSIUM 100 MG PO TABS: 100.0000 mg | ORAL_TABLET | Freq: Every day | ORAL | 3 refills | Status: AC

## 2024-06-03 NOTE — Assessment & Plan Note (Signed)
 BP Readings from Last 1 Encounters:  06/03/24 133/82   Well-controlled with Losartan  100 mg daily QD and amlodipine  10 mg QD Used to take Lasix PRN for leg swelling, now improved Counseled for compliance with the medications Advised DASH diet and moderate exercise/walking, at least 150 mins/week

## 2024-06-03 NOTE — Assessment & Plan Note (Signed)
 Physical exam as documented. Counseling done  re healthy lifestyle involving commitment to 150 minutes exercise per week, heart healthy diet, and attaining healthy weight.The importance of adequate sleep also discussed. Changes in health habits are decided on by the patient with goals and time frames  set for achieving them. Immunization and cancer screening needs are specifically addressed at this visit.  Fasting blood tests ordered from Dr. Lenis. Added CBC and Vitamin D .

## 2024-06-03 NOTE — Assessment & Plan Note (Signed)
 Lab Results  Component Value Date   HGBA1C 9.6 (A) 02/25/2024   Uncontrolled, but improving Take Humalog  75/25 - 20 Units QD and Glipizide  5 mg QD regularly Advised to take Humalog  15 Units BID until evaluation by Dr. Lenis, he had hypoglycemia with 45 Units dosing  F/u with Dr Lenis - last visit note reviewed Advised to follow diabetic diet On statin F/u CMP and lipid panel Diabetic eye exam: Advised to continue to follow up with Ophthalmology for diabetic eye exam

## 2024-06-03 NOTE — Progress Notes (Signed)
 Established Patient Office Visit  Subjective:  Patient ID: Chad Kirk, male    DOB: 07/31/1961  Age: 63 y.o. MRN: [id number]  CC:  Chief Complaint  Patient presents with   Diabetes    Three month follow up    Hypertension    Three month follow up     HPI Chad Kirk is a 63 y.o. male with past medical history of diabetes mellitus type 2, PVD, hypertension, hyperlipidemia, diabetic macular edema, BPH and alcohol abuse who presents for f/u of his chronic medical conditions.  HTN: BP is wnl today. Takes medications regularly - Losartan  100 mg QD and amlodipine  10 mg QD. Patient denies headache, dizziness, chest pain, dyspnea or palpitations.   DM: He follows up with Dr. Lenis for diabetes management. He was told to take Humalog  45 Units twice daily, but had hypoglycemia episodes with it. He takes Humalog  75/25 - 20 U QD and glipizide  5 mg QD. He is advised to follow insulin  regimen and diabetic diet as instructed. He follows up with ophthalmologist for diabetic macular edema and his vision has improved now. Denies any polyuria, polyphagia or fatigue.  BPH: Takes Flomax .  He has been evaluated by urology.  Denies any dysuria or hematuria.  Proteinuria: He has seen Dr. Rachele for it, but has lost follow up now.  He is on losartan  and Jardiance .   Past Medical History:  Diagnosis Date   Diabetes mellitus without complication (HCC)    DIAGNOSED AGE 81   Diabetic ulcer of left great toe (HCC) 01/25/2021   Healed diabetic foot ulcer 02/22/2021   Hypertension     Past Surgical History:  Procedure Laterality Date   ESOPHAGEAL DILATION N/A 12/07/2019   Procedure: ESOPHAGEAL OR PYLORIC DILATION;  Surgeon: Harvey Margo CROME, MD;  Location: [facility];  Service: Endoscopy;  Laterality: N/A;   ESOPHAGOGASTRODUODENOSCOPY N/A 12/07/2019   erosive gastritis, many non-bleeding cratered and superficial duodenal ulcers without stigmata of bleeding in duodenal bulb. Empiric dilation due to  possible occult esophageal web. Negative H.pylori.    EYE SURGERY  03/02/2020   diabetic retinopathy, lens placement, cataract removal, right eye   HERNIA REPAIR Left 1990    Family History  Problem Relation Age of Onset   Stroke Mother    Hypertension Mother    Diabetes Mother    Hypertension Father    Diabetes Father    Colon cancer Neg Hx    Colon polyps Neg Hx     Social History   Socioeconomic History   Marital status: Married    Spouse name: Not on file   Number of children: Not on file   Years of education: Not on file   Highest education level: Not on file  Occupational History   Not on file  Tobacco Use   Smoking status: Former    Current packs/day: 0.00    Average packs/day: 1 pack/day for 5.0 years (5.0 ttl pk-yrs)    Types: Cigarettes    Start date: 08/21/1983    Quit date: 08/20/1988    Years since quitting: 35.8   Smokeless tobacco: Never  Vaping Use   Vaping status: Never Used  Substance and Sexual Activity   Alcohol use: Not Currently    Comment: denied 03/15/20   Drug use: No   Sexual activity: Not on file  Other Topics Concern   Not on file  Social History Narrative   Not on file   Social Drivers of Corporate investment banker  Strain: Not on file  Food Insecurity: Not on file  Transportation Needs: Not on file  Physical Activity: Not on file  Stress: Not on file  Social Connections: Not on file  Intimate Partner Violence: Not on file    Outpatient Medications Prior to Visit  Medication Sig Dispense Refill   benzonatate  (TESSALON ) 200 MG capsule Take 1 capsule (200 mg total) by mouth 2 (two) times daily as needed for cough. 30 capsule 1   Blood Glucose Monitoring Suppl (ACCU-CHEK GUIDE ME) w/Device KIT 1 Piece by Does not apply route as directed. 1 kit 0   Carboxymethylcellulose Sodium (LUBRICANT EYE DROPS OP) Apply 1 Container to eye as needed (dry eye). Left eye     Cholecalciferol (VITAMIN D3) 125 MCG (5000 UT) CAPS Take 1 capsule (5,000  Units total) by mouth daily. 90 capsule 1   Continuous Glucose Receiver (FREESTYLE LIBRE 3 READER) DEVI 1 each by Does not apply route as directed. Use the Freestyle Libre 3 Reader to monitor blood glucose  continuously as directed by provider. 1 each 0   Continuous Glucose Sensor (FREESTYLE LIBRE 3 PLUS SENSOR) MISC 6 each by Does not apply route as directed. Apply sensor to monitor blood glucose continuously as directed by provider. Change sensor every 15 days. 6 each 2   dorzolamide (TRUSOPT) 2 % ophthalmic solution Place 1 drop into the left eye 3 (three) times daily.     empagliflozin  (JARDIANCE ) 10 MG TABS tablet Take 1 tablet (10 mg total) by mouth daily before breakfast. 90 tablet 1   finasteride  (PROSCAR ) 5 MG tablet Take 1 tablet by mouth once daily 30 tablet 0   gentamicin  cream (GARAMYCIN ) 0.1 % Apply to left heel once daily until healed. 30 g 0   glipiZIDE  (GLUCOTROL  XL) 5 MG 24 hr tablet Take 1 tablet by mouth once daily with breakfast 30 tablet 2   glucose blood (ONETOUCH VERIO) test strip USE 1 STRIP TO CHECK GLUCOSE UP TO 2 TIMES DAILY 100 each 2   Insulin  Lispro Prot & Lispro (HUMALOG  MIX 75/25 KWIKPEN) (24-74) 100 UNIT/ML Kwikpen Inject 45 Units into the skin 2 (two) times daily with a meal. 45 mL 2   Lancets (ONETOUCH DELICA PLUS LANCET33G) MISC Apply topically.     Multiple Vitamins-Minerals (QC DAILY MULTIVIT/MULTIMINERAL PO) Take 1 tablet by mouth daily.     mupirocin  ointment (BACTROBAN ) 2 % Apply 1 Application topically 2 (two) times daily. 30 g 2   prednisoLONE acetate (PRED FORTE) 1 % ophthalmic suspension 1 drop 4 (four) times daily.     Probiotic Product (PROBIOTIC PO) Take 1 tablet by mouth daily.     RELION PEN NEEDLES 32G X 4 MM MISC USE 1 PEN NEEDLE  TO INJECT INSULIN  100 each 0   rosuvastatin  (CRESTOR ) 10 MG tablet Take 1 tablet by mouth once daily 30 tablet 2   scopolamine  (TRANSDERM-SCOP) 1 MG/3DAYS Place 1 patch (1.5 mg total) onto the skin every 3 (three) days.  10 patch 0   senna-docusate (SENOKOT-S) 8.6-50 MG tablet Take 1 tablet by mouth as needed.      sildenafil  (VIAGRA ) 100 MG tablet TAKE 1 TABLET BY MOUTH ONCE DAILY AS NEEDED FOR ERECTILE DYSFUNCTION 20 tablet 0   tamsulosin  (FLOMAX ) 0.4 MG CAPS capsule Take 1 capsule (0.4 mg total) by mouth daily. 90 capsule 3   timolol (TIMOPTIC) 0.5 % ophthalmic solution Place 1 drop into both eyes 3 times daily.     UNABLE TO FIND Take 5  mLs by mouth every 4 (four) hours as needed. Med Name: Apothecary Cough Syrup Take 5 mL by mouth every 4 to 6 hours as needed for cough 120 mL 0   amLODipine  (NORVASC ) 10 MG tablet Take 1 tablet by mouth once daily 30 tablet 0   Continuous Glucose Receiver (FREESTYLE LIBRE 2 READER) DEVI Use to test BG as directed. E11.65 1 each 0   Continuous Glucose Sensor (FREESTYLE LIBRE 2 SENSOR) MISC CHANGE SENSOR EVERY 14 DAYS 6 each 0   losartan  (COZAAR ) 100 MG tablet Take 1 tablet by mouth once daily 30 tablet 0   methylPREDNISolone  (MEDROL  DOSEPAK) 4 MG TBPK tablet Take as package instructions. (Patient not taking: Reported on 05/26/2024) 1 each 0   mupirocin  ointment (BACTROBAN ) 2 % Apply 1 Application topically daily. (Patient not taking: Reported on 05/26/2024) 22 g 0   No facility-administered medications prior to visit.    No Known Allergies  ROS Review of Systems  Constitutional:  Negative for chills and fever.  HENT:  Negative for congestion and sore throat.   Eyes:  Positive for visual disturbance. Negative for pain and discharge.  Respiratory:  Negative for cough and shortness of breath.   Cardiovascular:  Negative for chest pain and palpitations.  Gastrointestinal:  Negative for diarrhea, nausea and vomiting.  Endocrine: Negative for polydipsia and polyuria.  Genitourinary:  Negative for dysuria and hematuria.  Musculoskeletal:  Negative for neck pain and neck stiffness.  Skin:  Negative for rash.       Facial swelling  Neurological:  Negative for dizziness and  weakness.  Psychiatric/Behavioral:  Negative for agitation and behavioral problems.       Objective:    Physical Exam Vitals reviewed.  Constitutional:      General: He is not in acute distress.    Appearance: He is not diaphoretic.  HENT:     Head: Normocephalic and atraumatic.     Nose: Nose normal.     Mouth/Throat:     Mouth: Mucous membranes are moist.     Comments: Left sided facial swelling Eyes:     General: No scleral icterus.    Extraocular Movements: Extraocular movements intact.  Cardiovascular:     Rate and Rhythm: Normal rate and regular rhythm.     Heart sounds: Normal heart sounds. No murmur heard. Pulmonary:     Breath sounds: Normal breath sounds. No wheezing or rales.  Abdominal:     Palpations: Abdomen is soft.     Tenderness: There is no abdominal tenderness.  Musculoskeletal:     Cervical back: Neck supple. No tenderness.     Right lower leg: No edema.     Left lower leg: No edema.  Skin:    General: Skin is warm.     Findings: No rash.  Neurological:     General: No focal deficit present.     Mental Status: He is alert and oriented to person, place, and time.     Cranial Nerves: No cranial nerve deficit.     Sensory: No sensory deficit.     Motor: No weakness.  Psychiatric:        Mood and Affect: Mood normal.        Behavior: Behavior normal.     BP 133/82   Pulse 83   Ht 5' 9 (1.753 m)   Wt 178 lb (80.7 kg)   SpO2 98%   BMI 26.29 kg/m  Wt Readings from Last 3 Encounters:  06/03/24 178 lb (  80.7 kg)  02/26/24 187 lb 6.4 oz (85 kg)  02/25/24 189 lb (85.7 kg)    Lab Results  Component Value Date   TSH 2.010 12/14/2022   Lab Results  Component Value Date   WBC 6.1 12/12/2021   HGB 14.0 12/12/2021   HCT 42.9 12/12/2021   MCV 81 12/12/2021   PLT 214 12/12/2021   Lab Results  Component Value Date   NA 138 08/26/2023   K 4.9 08/26/2023   CO2 22 08/26/2023   GLUCOSE 264 (H) 08/26/2023   BUN 22 08/26/2023   CREATININE 1.33  (H) 08/26/2023   BILITOT 0.3 08/26/2023   ALKPHOS 75 08/26/2023   AST 24 08/26/2023   ALT 33 08/26/2023   PROT 7.0 08/26/2023   ALBUMIN 4.1 08/26/2023   CALCIUM  9.1 08/26/2023   ANIONGAP 8 12/30/2019   EGFR 60 08/26/2023   Lab Results  Component Value Date   CHOL 137 08/26/2023   Lab Results  Component Value Date   HDL 42 08/26/2023   Lab Results  Component Value Date   LDLCALC 82 08/26/2023   Lab Results  Component Value Date   TRIG 62 08/26/2023   Lab Results  Component Value Date   CHOLHDL 3.3 08/26/2023   Lab Results  Component Value Date   HGBA1C 9.6 (A) 02/25/2024      Assessment & Plan:   Problem List Items Addressed This Visit       Cardiovascular and Mediastinum   Essential hypertension   BP Readings from Last 1 Encounters:  06/03/24 133/82   Well-controlled with Losartan  100 mg daily QD and amlodipine  10 mg QD Used to take Lasix PRN for leg swelling, now improved Counseled for compliance with the medications Advised DASH diet and moderate exercise/walking, at least 150 mins/week      Relevant Medications   losartan  (COZAAR ) 100 MG tablet   amLODipine  (NORVASC ) 10 MG tablet   Other Relevant Orders   CBC with Differential/Platelet     Endocrine   Type 2 diabetes mellitus with ophthalmic complication (HCC)   Lab Results  Component Value Date   HGBA1C 9.6 (A) 02/25/2024   Uncontrolled, but improving Take Humalog  75/25 - 20 Units QD and Glipizide  5 mg QD regularly Advised to take Humalog  15 Units BID until evaluation by Dr. Lenis, he had hypoglycemia with 45 Units dosing  F/u with Dr Lenis - last visit note reviewed Advised to follow diabetic diet On statin F/u CMP and lipid panel Diabetic eye exam: Advised to continue to follow up with Ophthalmology for diabetic eye exam      Relevant Medications   losartan  (COZAAR ) 100 MG tablet   Other Relevant Orders   Urine Microalbumin w/creat. ratio     Genitourinary   BPH (benign prostatic  hyperplasia)   On Flomax  and Finasteride  - refilled Followed by Urology        Other   Encounter for general adult medical examination with abnormal findings - Primary   Physical exam as documented. Counseling done  re healthy lifestyle involving commitment to 150 minutes exercise per week, heart healthy diet, and attaining healthy weight.The importance of adequate sleep also discussed. Changes in health habits are decided on by the patient with goals and time frames  set for achieving them. Immunization and cancer screening needs are specifically addressed at this visit.  Fasting blood tests ordered from Dr. Lenis. Added CBC and Vitamin D .      Vitamin D  deficiency   Relevant Orders  Vitamin D  (25 hydroxy)   Colon cancer screening   Discussed about colonoscopy and cologuard - benefits of each procedure discussed. Patient prefers Cologuard - ordered.      Relevant Orders   Cologuard   Other Visit Diagnoses       Encounter for immunization       Relevant Orders   Flu vaccine trivalent PF, 6mos and older(Flulaval,Afluria,Fluarix,Fluzone) (Completed)        Meds ordered this encounter  Medications   losartan  (COZAAR ) 100 MG tablet    Sig: Take 1 tablet (100 mg total) by mouth daily.    Dispense:  90 tablet    Refill:  3   amLODipine  (NORVASC ) 10 MG tablet    Sig: Take 1 tablet (10 mg total) by mouth daily.    Dispense:  90 tablet    Refill:  3    Follow-up: Return in about 4 months (around 10/04/2024) for HTN and proteinuria.    Suzzane MARLA Blanch, MD

## 2024-06-03 NOTE — Assessment & Plan Note (Signed)
 On Flomax  and Finasteride  - refilled Followed by Urology

## 2024-06-03 NOTE — Assessment & Plan Note (Signed)
 Discussed about colonoscopy and cologuard - benefits of each procedure discussed. Patient prefers Cologuard - ordered.

## 2024-06-03 NOTE — Patient Instructions (Signed)
 Please start taking Humalog  15 Units twice daily instead of 20 Units once daily.  Please continue to take other medications as prescribed.  Please continue to follow low carb diet and perform moderate exercise/walking at least 150 mins/week.

## 2024-06-05 LAB — MICROALBUMIN / CREATININE URINE RATIO
Creatinine, Urine: 39.4 mg/dL
Microalb/Creat Ratio: 237 mg/g{creat} — ABNORMAL HIGH (ref 0–29)
Microalbumin, Urine: 93.2 ug/mL

## 2024-06-10 ENCOUNTER — Ambulatory Visit: Admitting: Podiatry

## 2024-06-10 ENCOUNTER — Encounter: Payer: Self-pay | Admitting: Podiatry

## 2024-06-10 DIAGNOSIS — M7752 Other enthesopathy of left foot: Secondary | ICD-10-CM

## 2024-06-10 NOTE — Progress Notes (Signed)
 Presents follow-up superficial ulcer hallux left.  Doing much better has not noticed any drainage or crusting.  Has been doing the wound care as instructed.   Physical exam:  General appearance: Pleasant, and in no acute distress. AOx3.  Vascular: Pedal pulses: DP 2/4 bilaterally, PT 0/4 bilaterally. Mild edema lower legs bilaterally. Capillary fill time needed bilaterally.  Neurological: Grossly intact bilaterally  Dermatologic:   Skin normal temperature bilaterally.  Skin normal color, tone, and texture bilaterally.   Musculoskeletal: Tenderness distal aspect hallux left.  Hallux limitus with only about 15 to 20 degrees range of motion at the first metatarsal phalangeal joint left.   Diagnosis: 1.  Bursitis distal hallux left  Plan: -Establish office visit for evaluation and management level 3 - Discussed with Chad Kirk the hallux limitus and its contribution to the pressure on the end of the toe.  Recommended socks with really good cushioning.  Wear good supportive shoes.  Will dispense a sleeve sleeve to cover the toe and act as a To take some of the pressure off it also recommend he might want a go up a half size on his shoes so there is less pressure on it -Dispensed toe sleeve/Hallux left  Return as needed

## 2024-06-14 ENCOUNTER — Other Ambulatory Visit: Payer: Self-pay | Admitting: Internal Medicine

## 2024-06-14 DIAGNOSIS — N400 Enlarged prostate without lower urinary tract symptoms: Secondary | ICD-10-CM

## 2024-06-21 LAB — COLOGUARD: COLOGUARD: NEGATIVE

## 2024-06-22 ENCOUNTER — Ambulatory Visit: Payer: Self-pay | Admitting: Internal Medicine

## 2024-07-01 ENCOUNTER — Ambulatory Visit: Admitting: "Endocrinology

## 2024-07-06 ENCOUNTER — Other Ambulatory Visit: Payer: Self-pay | Admitting: Internal Medicine

## 2024-07-06 DIAGNOSIS — N529 Male erectile dysfunction, unspecified: Secondary | ICD-10-CM

## 2024-07-07 LAB — COMPREHENSIVE METABOLIC PANEL WITH GFR
ALT: 28 IU/L (ref 0–44)
AST: 22 IU/L (ref 0–40)
Albumin: 4.4 g/dL (ref 3.9–4.9)
Alkaline Phosphatase: 76 IU/L (ref 47–123)
BUN/Creatinine Ratio: 22 (ref 10–24)
BUN: 32 mg/dL — ABNORMAL HIGH (ref 8–27)
Bilirubin Total: 0.4 mg/dL (ref 0.0–1.2)
CO2: 21 mmol/L (ref 20–29)
Calcium: 9.4 mg/dL (ref 8.6–10.2)
Chloride: 104 mmol/L (ref 96–106)
Creatinine, Ser: 1.45 mg/dL — ABNORMAL HIGH (ref 0.76–1.27)
Globulin, Total: 3.1 g/dL (ref 1.5–4.5)
Glucose: 209 mg/dL — ABNORMAL HIGH (ref 70–99)
Potassium: 5.1 mmol/L (ref 3.5–5.2)
Sodium: 139 mmol/L (ref 134–144)
Total Protein: 7.5 g/dL (ref 6.0–8.5)
eGFR: 54 mL/min/1.73 — ABNORMAL LOW (ref >59)

## 2024-07-07 LAB — CBC WITH DIFFERENTIAL/PLATELET
Basophils Absolute: 0 x10E3/uL (ref 0.0–0.2)
Basos: 1 %
EOS (ABSOLUTE): 0.1 x10E3/uL (ref 0.0–0.4)
Eos: 1 %
Hematocrit: 45 % (ref 37.5–51.0)
Hemoglobin: 13.7 g/dL (ref 13.0–17.7)
Immature Grans (Abs): 0 x10E3/uL (ref 0.0–0.1)
Immature Granulocytes: 0 %
Lymphocytes Absolute: 1.5 x10E3/uL (ref 0.7–3.1)
Lymphs: 28 %
MCH: 26.4 pg — ABNORMAL LOW (ref 26.6–33.0)
MCHC: 30.4 g/dL — ABNORMAL LOW (ref 31.5–35.7)
MCV: 87 fL (ref 79–97)
Monocytes Absolute: 0.7 x10E3/uL (ref 0.1–0.9)
Monocytes: 12 %
Neutrophils Absolute: 3.1 x10E3/uL (ref 1.4–7.0)
Neutrophils: 58 %
Platelets: 219 x10E3/uL (ref 150–450)
RBC: 5.18 x10E6/uL (ref 4.14–5.80)
RDW: 12.7 % (ref 11.6–15.4)
WBC: 5.4 x10E3/uL (ref 3.4–10.8)

## 2024-07-07 LAB — T4, FREE: Free T4: 1.15 ng/dL (ref 0.82–1.77)

## 2024-07-07 LAB — FIB-4 W/REFLEX TO ELF
FIB-4 Index: 1.22 (ref 0.00–2.67)
Platelets: 218 x10E3/uL (ref 150–450)

## 2024-07-07 LAB — LIPID PANEL
Chol/HDL Ratio: 3.4 ratio (ref 0.0–5.0)
Cholesterol, Total: 134 mg/dL (ref 100–199)
HDL: 40 mg/dL (ref >39)
LDL Chol Calc (NIH): 74 mg/dL (ref 0–99)
Triglycerides: 106 mg/dL (ref 0–149)
VLDL Cholesterol Cal: 20 mg/dL (ref 5–40)

## 2024-07-07 LAB — TSH: TSH: 1.79 u[IU]/mL (ref 0.450–4.500)

## 2024-07-07 LAB — VITAMIN D 25 HYDROXY (VIT D DEFICIENCY, FRACTURES): Vit D, 25-Hydroxy: 41.7 ng/mL (ref 30.0–100.0)

## 2024-07-20 ENCOUNTER — Other Ambulatory Visit: Payer: Self-pay

## 2024-07-20 ENCOUNTER — Emergency Department (HOSPITAL_COMMUNITY): Admission: EM | Admit: 2024-07-20 | Discharge: 2024-07-20 | Disposition: A | Discharge status: Home / Self Care

## 2024-07-20 ENCOUNTER — Ambulatory Visit: Payer: Self-pay

## 2024-07-20 ENCOUNTER — Encounter (HOSPITAL_COMMUNITY): Payer: Self-pay

## 2024-07-20 DIAGNOSIS — R21 Rash and other nonspecific skin eruption: Secondary | ICD-10-CM | POA: Diagnosis present

## 2024-07-20 DIAGNOSIS — Z794 Long term (current) use of insulin: Secondary | ICD-10-CM | POA: Insufficient documentation

## 2024-07-20 DIAGNOSIS — T7840XA Allergy, unspecified, initial encounter: Secondary | ICD-10-CM | POA: Diagnosis not present

## 2024-07-20 LAB — CBG MONITORING, ED: Glucose-Capillary: 154 mg/dL — ABNORMAL HIGH (ref 70–99)

## 2024-07-20 MED ORDER — HYDROXYZINE HCL 25 MG PO TABS: 25.0000 mg | ORAL_TABLET | Freq: Four times a day (QID) | ORAL | 0 refills | Status: AC | PRN

## 2024-07-20 MED ORDER — PREDNISONE 10 MG PO TABS: 40.0000 mg | ORAL_TABLET | Freq: Every day | ORAL | 0 refills | Status: AC

## 2024-07-20 NOTE — Discharge Instructions (Signed)
 Please follow-up closely with your primary care doctor and ophthalmologist on an outpatient basis for reevaluation.  Return to emergency department immediately for any new or worsening symptoms.  Please monitor your blood sugar closely as the steroid will make your blood sugar go up.

## 2024-07-20 NOTE — ED Provider Notes (Signed)
 [place] EMERGENCY DEPARTMENT AT [facility] Provider Note   CSN: [id number] Arrival date & time: 07/20/24  1015     Patient presents with: Allergic Reaction   Chad Kirk is a 63 y.o. male.   Patient is a 63 year old male who presents to the emergency department with a chief complaint of rash to his face which has been ongoing for approximate the past 5 days.  He has been taking Benadryl and Zyrtec without relief.  He denies any new soaps, lotions, deodorants, detergents or medications.  He has not recently been on antibiotics.  He denies any associated stridor, dysphagia, drooling, dyspnea.  He has had no other associated rashes.  He notes that he has had some intermittent pruritus to his eyes and periorbital region but denies any pain directly to his eyes or associated visual changes.   Allergic Reaction Presenting symptoms: rash        Prior to Admission medications   Medication Sig Start Date End Date Taking? Authorizing Provider  hydrOXYzine (ATARAX) 25 MG tablet Take 1 tablet (25 mg total) by mouth every 6 (six) hours as needed for itching. 07/20/24  Yes Daralene Bruckner D, PA-C  predniSONE (DELTASONE) 10 MG tablet Take 4 tablets (40 mg total) by mouth daily for 5 days. 07/20/24 07/25/24 Yes Daralene Bruckner D, PA-C  amLODipine  (NORVASC ) 10 MG tablet Take 1 tablet (10 mg total) by mouth daily. 06/03/24   Tobie Suzzane POUR, MD  benzonatate  (TESSALON ) 200 MG capsule Take 1 capsule (200 mg total) by mouth 2 (two) times daily as needed for cough. 01/15/24   Tobie Suzzane POUR, MD  Blood Glucose Monitoring Suppl (ACCU-CHEK GUIDE ME) w/Device KIT 1 Piece by Does not apply route as directed. 05/05/20   Nida, Gebreselassie W, MD  Carboxymethylcellulose Sodium (LUBRICANT EYE DROPS OP) Apply 1 Container to eye as needed (dry eye). Left eye    [provider]  Cholecalciferol (VITAMIN D3) 125 MCG (5000 UT) CAPS Take 1 capsule (5,000 Units total) by mouth daily. 05/21/22    Lenis Ethelle ORN, MD  Continuous Glucose Receiver (FREESTYLE LIBRE 3 READER) DEVI 1 each by Does not apply route as directed. Use the Freestyle Libre 3 Reader to monitor blood glucose  continuously as directed by provider. 05/05/24   Nida, Gebreselassie W, MD  Continuous Glucose Sensor (FREESTYLE LIBRE 3 PLUS SENSOR) MISC 6 each by Does not apply route as directed. Apply sensor to monitor blood glucose continuously as directed by provider. Change sensor every 15 days. 05/05/24   Nida, Gebreselassie W, MD  dorzolamide (TRUSOPT) 2 % ophthalmic solution Place 1 drop into the left eye 3 (three) times daily.    [provider]  empagliflozin  (JARDIANCE ) 10 MG TABS tablet Take 1 tablet (10 mg total) by mouth daily before breakfast. 02/25/24   Nida, Ethelle ORN, MD  finasteride  (PROSCAR ) 5 MG tablet Take 1 tablet by mouth once daily 06/15/24   Patel, Rutwik K, MD  gentamicin  cream (GARAMYCIN ) 0.1 % Apply to left heel once daily until healed. 11/28/22   Galaway, Jennifer L, DPM  glipiZIDE  (GLUCOTROL  XL) 5 MG 24 hr tablet Take 1 tablet by mouth once daily with breakfast 05/18/24   Nida, Gebreselassie W, MD  glucose blood (ONETOUCH VERIO) test strip USE 1 STRIP TO CHECK GLUCOSE UP TO 2 TIMES DAILY 05/11/21   Nida, Gebreselassie W, MD  Insulin  Lispro Prot & Lispro (HUMALOG  MIX 75/25 KWIKPEN) (75-25) 100 UNIT/ML Kwikpen Inject 45 Units into the skin 2 (two)  times daily with a meal. 02/25/24   Nida, Ethelle ORN, MD  Lancets [facility] DELICA PLUS [place]) MISC Apply topically. 02/27/21   [provider]  losartan  (COZAAR ) 100 MG tablet Take 1 tablet (100 mg total) by mouth daily. 06/03/24   Tobie Suzzane POUR, MD  Multiple Vitamins-Minerals (QC DAILY MULTIVIT/MULTIMINERAL PO) Take 1 tablet by mouth daily.    [provider]  mupirocin  ointment (BACTROBAN ) 2 % Apply 1 Application topically 2 (two) times daily. 05/26/24   Christine Rush, DPM  prednisoLONE acetate (PRED FORTE) 1 % ophthalmic  suspension 1 drop 4 (four) times daily. 12/30/20   [provider]  Probiotic Product (PROBIOTIC PO) Take 1 tablet by mouth daily.    [provider]  RELION PEN NEEDLES 32G X 4 MM MISC USE 1 PEN NEEDLE  TO INJECT INSULIN  04/07/24   Nida, Gebreselassie W, MD  rosuvastatin  (CRESTOR ) 10 MG tablet Take 1 tablet by mouth once daily 05/18/24   Nida, Gebreselassie W, MD  scopolamine  (TRANSDERM-SCOP) 1 MG/3DAYS Place 1 patch (1.5 mg total) onto the skin every 3 (three) days. 12/12/23   Tobie Suzzane POUR, MD  senna-docusate (SENOKOT-S) 8.6-50 MG tablet Take 1 tablet by mouth as needed.     [provider]  sildenafil  (VIAGRA ) 100 MG tablet TAKE 1 TABLET BY MOUTH ONCE DAILY AS NEEDED FOR ERECTILE DYSFUNCTION 07/06/24   Tobie Suzzane POUR, MD  tamsulosin  (FLOMAX ) 0.4 MG CAPS capsule Take 1 capsule (0.4 mg total) by mouth daily. 02/26/24   Patel, Rutwik K, MD  timolol (TIMOPTIC) 0.5 % ophthalmic solution Place 1 drop into both eyes 3 times daily. 10/24/21   [provider]  UNABLE TO FIND Take 5 mLs by mouth every 4 (four) hours as needed. Med Name: Apothecary Cough Syrup Take 5 mL by mouth every 4 to 6 hours as needed for cough 09/13/20   Elnor Fairy HERO, NP    Allergies: Patient has no known allergies.    Review of Systems  Skin:  Positive for rash.  All other systems reviewed and are negative.   Updated Vital Signs BP (!) 127/92   Pulse 86   Temp 97.6 F (36.4 C) (Temporal)   Resp 16   Ht 5' 9 (1.753 m)   Wt 80.7 kg   SpO2 99%   BMI 26.27 kg/m   Physical Exam Vitals and nursing note reviewed.  Constitutional:      General: He is not in acute distress.    Appearance: Normal appearance. He is not ill-appearing.  HENT:     Head: Normocephalic and atraumatic.     Nose: Nose normal.     Mouth/Throat:     Mouth: Mucous membranes are moist.  Eyes:     General:        Right eye: No discharge.        Left eye: No discharge.     Extraocular Movements: Extraocular  movements intact.     Conjunctiva/sclera: Conjunctivae normal.     Pupils: Pupils are equal, round, and reactive to light.  Cardiovascular:     Rate and Rhythm: Normal rate and regular rhythm.     Pulses: Normal pulses.     Heart sounds: Normal heart sounds. No murmur heard.    No gallop.  Pulmonary:     Effort: Pulmonary effort is normal. No respiratory distress.     Breath sounds: Normal breath sounds. No stridor. No wheezing, rhonchi or rales.  Musculoskeletal:  General: Normal range of motion.     Cervical back: Normal range of motion and neck supple.  Skin:    General: Skin is warm and dry.     Comments: Erythematous rash noted over face and periorbital region, no areas of induration or fluctuance, no cellulitic changes  Neurological:     General: No focal deficit present.     Mental Status: He is alert and oriented to person, place, and time. Mental status is at baseline.  Psychiatric:        Mood and Affect: Mood normal.        Behavior: Behavior normal.        Thought Content: Thought content normal.        Judgment: Judgment normal.     (all labs ordered are listed, but only abnormal results are displayed) Labs Reviewed  CBG MONITORING, ED - Abnormal; Notable for the following components:      Result Value   Glucose-Capillary 154 (*)    All other components within normal limits    EKG: None  Radiology: No results found.   Procedures   Medications Ordered in the ED - No data to display                                  Medical Decision Making Patient is doing well at this time and is stable for discharge home.  Discussed with patient we will treat him with a short course of steroids and hydroxyzine at this time.  Discussed with patient need to monitor his blood sugar closely as this will make his blood sugar go up.  He has voiced understanding to this.  Do not suspect on line etiology such as cellulitis, periorbital or orbital cellulitis.  He has no  visual changes at this time.  Low suspicion for underlying etiology such as conjunctivitis, uveitis, optic neuritis.  He has no other associated rashes throughout no systemic complaints.  He has no indication for Elspeth Louder syndrome, toxic epidermal necrolysis, syphilis, meningitis, tickborne illness, lupus.  The need for close follow-up with primary care doctor and ophthalmologist was discussed as well as strict turn precautions for any new or worsening symptoms.  Patient voiced understanding and had no additional questions.  Patient was fully evaluated by attending physician who is in agreement to plan at this time.  Risk Prescription drug management.        Final diagnoses:  Allergic reaction, initial encounter    ED Discharge Orders          Ordered    predniSONE (DELTASONE) 10 MG tablet  Daily        07/20/24 1225    hydrOXYzine (ATARAX) 25 MG tablet  Every 6 hours PRN        07/20/24 1225               Daralene Lonni BIRCH, PA-C 07/20/24 1231    Simon Lavonia SAILOR, MD 07/20/24 [phone]

## 2024-07-20 NOTE — ED Triage Notes (Signed)
 Pt arrived via POV c/o allergic reaction possibly to lemonade from [facility] since last week Wednesday. Pt reports facial swelling around his eyes and itching. Pt reports taking OTC Benadryl and Zyrtec w/o relief. Pt denies SOB or difficulty breathing. Pt reports calling his eye doctor but has not heard back from them yet. Pt advised to go to the ER for evaluation.

## 2024-07-20 NOTE — Telephone Encounter (Signed)
 FYI Only or Action Required?: FYI only for provider: ED advised.  Patient was last seen in primary care on 06/03/2024 by Tobie Suzzane POUR, MD.  Called Nurse Triage reporting Eye Problem.  Symptoms began several days ago.  Interventions attempted: Nothing.  Symptoms are: unchanged.  Triage Disposition: Go to ED Now (or PCP Triage)  Patient/caregiver understands and will follow disposition?: Yes               Copied from CRM [id number]. Topic: Clinical - Red Word Triage >> Jul 20, 2024  8:19 AM Suzen RAMAN wrote: Red Word that prompted transfer to Nurse Triage: allergic reaction; swollen face since Wednesday(Last week). No sure what the cause was. Reason for Disposition  [1] Blurred vision or visual changes AND [2] present now AND [3] sudden onset or new (e.g., minutes, hours, days)  (Exception: Seeing floaters / black specks OR previously diagnosed migraine headaches with same symptoms.)  Answer Assessment - Initial Assessment Questions Patient thinks he is having an allergic reaction but isnt sure exactly what may be causing it Eyesight started being blurry Wednesday Thursday patient woke up with facial swelling, states eyes were almost closed Patient states he is legally blind in left eye Seeing floaters--hasn't seen these in over a year or two This morning blood sugar was 160 and it was 134 last night when patient went to bed Has not checked blood pressure Had not changed any medications Called eye doctor but they havent called him back Patient states that he had some Sprite Lemonade Wednesday from a [place] machine---he states that is the only thing different that he is aware of Patient states years ago he drank something that caused him to have an allergic reaction--champagne Denies of fevers, chest pain, difficulty breathing, recent known illnesses Patient also states that he has itching He is advised that with his symptoms involving multiple body systems it is  recommended that he goes to the Emergency Room at this time for further evaluation Patient states he will call his wife and get her to take him to the ER Patient is advised that if anything gets worse to call 911 Patient verbalized understanding  Protocols used: Vision Loss or Change-A-AH

## 2024-07-22 ENCOUNTER — Encounter: Payer: Self-pay | Admitting: "Endocrinology

## 2024-07-22 ENCOUNTER — Ambulatory Visit (INDEPENDENT_AMBULATORY_CARE_PROVIDER_SITE_OTHER): Admitting: "Endocrinology

## 2024-07-22 VITALS — BP 106/66 | HR 76 | Ht 69.0 in | Wt 174.4 lb

## 2024-07-22 DIAGNOSIS — I10 Essential (primary) hypertension: Secondary | ICD-10-CM | POA: Diagnosis not present

## 2024-07-22 DIAGNOSIS — E782 Mixed hyperlipidemia: Secondary | ICD-10-CM

## 2024-07-22 DIAGNOSIS — E113592 Type 2 diabetes mellitus with proliferative diabetic retinopathy without macular edema, left eye: Secondary | ICD-10-CM

## 2024-07-22 DIAGNOSIS — Z794 Long term (current) use of insulin: Secondary | ICD-10-CM

## 2024-07-22 DIAGNOSIS — E559 Vitamin D deficiency, unspecified: Secondary | ICD-10-CM

## 2024-07-22 LAB — POCT GLYCOSYLATED HEMOGLOBIN (HGB A1C): HbA1c, POC (controlled diabetic range): 10 % — AB (ref 0.0–7.0)

## 2024-07-22 MED ORDER — INSULIN LISPRO PROT & LISPRO (75-25 MIX) 100 UNIT/ML KWIKPEN: 30.0000 [IU] | PEN_INJECTOR | Freq: Two times a day (BID) | SUBCUTANEOUS | 2 refills | Status: AC

## 2024-07-22 NOTE — Progress Notes (Signed)
 07/22/2024, 11:16 AM  Endocrinology follow-up note   Subjective:    Patient ID: Chad Kirk, male    DOB: 10/21/1960.  Chad Kirk is being seen in follow-up after he was seen in consultation for management of currently uncontrolled symptomatic diabetes requested by  Tobie Suzzane POUR, MD.   Past Medical History:  Diagnosis Date   Diabetes mellitus without complication (HCC)    DIAGNOSED AGE 63   Diabetic ulcer of left great toe (HCC) 01/25/2021   Healed diabetic foot ulcer 02/22/2021   Hypertension     Past Surgical History:  Procedure Laterality Date   ESOPHAGEAL DILATION N/A 12/07/2019   Procedure: ESOPHAGEAL OR PYLORIC DILATION;  Surgeon: Harvey Margo CROME, MD;  Location: [facility];  Service: Endoscopy;  Laterality: N/A;   ESOPHAGOGASTRODUODENOSCOPY N/A 12/07/2019   erosive gastritis, many non-bleeding cratered and superficial duodenal ulcers without stigmata of bleeding in duodenal bulb. Empiric dilation due to possible occult esophageal web. Negative H.pylori.    EYE SURGERY  03/02/2020   diabetic retinopathy, lens placement, cataract removal, right eye   HERNIA REPAIR Left 1990    Social History   Socioeconomic History   Marital status: Married    Spouse name: Not on file   Number of children: Not on file   Years of education: Not on file   Highest education level: Not on file  Occupational History   Not on file  Tobacco Use   Smoking status: Former    Current packs/day: 0.00    Average packs/day: 1 pack/day for 5.0 years (5.0 ttl pk-yrs)    Types: Cigarettes    Start date: 08/21/1983    Quit date: 08/20/1988    Years since quitting: 35.9   Smokeless tobacco: Never  Vaping Use   Vaping status: Never Used  Substance and Sexual Activity   Alcohol use: Not Currently    Comment: denied 03/15/20   Drug use: No   Sexual activity: Not on file  Other Topics Concern   Not on file   Social History Narrative   Not on file   Social Drivers of Health   Financial Resource Strain: Not on file  Food Insecurity: Not on file  Transportation Needs: Not on file  Physical Activity: Not on file  Stress: Not on file  Social Connections: Not on file    Family History  Problem Relation Age of Onset   Stroke Mother    Hypertension Mother    Diabetes Mother    Hypertension Father    Diabetes Father    Colon cancer Neg Hx    Colon polyps Neg Hx     Outpatient Encounter Medications as of 07/22/2024  Medication Sig   amLODipine  (NORVASC ) 10 MG tablet Take 1 tablet (10 mg total) by mouth daily.   benzonatate  (TESSALON ) 200 MG capsule Take 1 capsule (200 mg total) by mouth 2 (two) times daily as needed for cough.   Blood Glucose Monitoring Suppl (ACCU-CHEK GUIDE ME) w/Device KIT 1 Piece by Does not apply route as directed.   Carboxymethylcellulose Sodium (LUBRICANT EYE DROPS OP) Apply 1 Container to eye  as needed (dry eye). Left eye   Cholecalciferol (VITAMIN D3) 125 MCG (5000 UT) CAPS Take 1 capsule (5,000 Units total) by mouth daily.   Continuous Glucose Receiver (FREESTYLE LIBRE 3 READER) DEVI 1 each by Does not apply route as directed. Use the Freestyle Libre 3 Reader to monitor blood glucose  continuously as directed by provider.   Continuous Glucose Sensor (FREESTYLE LIBRE 3 PLUS SENSOR) MISC 6 each by Does not apply route as directed. Apply sensor to monitor blood glucose continuously as directed by provider. Change sensor every 15 days.   dorzolamide (TRUSOPT) 2 % ophthalmic solution Place 1 drop into the left eye 3 (three) times daily.   empagliflozin  (JARDIANCE ) 10 MG TABS tablet Take 1 tablet (10 mg total) by mouth daily before breakfast.   finasteride  (PROSCAR ) 5 MG tablet Take 1 tablet by mouth once daily   gentamicin  cream (GARAMYCIN ) 0.1 % Apply to left heel once daily until healed.   glipiZIDE  (GLUCOTROL  XL) 5 MG 24 hr tablet Take 1 tablet by mouth once daily  with breakfast   glucose blood (ONETOUCH VERIO) test strip USE 1 STRIP TO CHECK GLUCOSE UP TO 2 TIMES DAILY   hydrOXYzine (ATARAX) 25 MG tablet Take 1 tablet (25 mg total) by mouth every 6 (six) hours as needed for itching.   Insulin  Lispro Prot & Lispro (HUMALOG  MIX 75/25 KWIKPEN) (75-25) 100 UNIT/ML Kwikpen Inject 30 Units into the skin 2 (two) times daily with a meal.   Lancets (ONETOUCH DELICA PLUS LANCET33G) MISC Apply topically.   losartan  (COZAAR ) 100 MG tablet Take 1 tablet (100 mg total) by mouth daily.   Multiple Vitamins-Minerals (QC DAILY MULTIVIT/MULTIMINERAL PO) Take 1 tablet by mouth daily.   mupirocin  ointment (BACTROBAN ) 2 % Apply 1 Application topically 2 (two) times daily.   prednisoLONE acetate (PRED FORTE) 1 % ophthalmic suspension 1 drop 4 (four) times daily.   predniSONE (DELTASONE) 10 MG tablet Take 4 tablets (40 mg total) by mouth daily for 5 days.   Probiotic Product (PROBIOTIC PO) Take 1 tablet by mouth daily.   RELION PEN NEEDLES 32G X 4 MM MISC USE 1 PEN NEEDLE  TO INJECT INSULIN    rosuvastatin  (CRESTOR ) 10 MG tablet Take 1 tablet by mouth once daily   scopolamine  (TRANSDERM-SCOP) 1 MG/3DAYS Place 1 patch (1.5 mg total) onto the skin every 3 (three) days.   senna-docusate (SENOKOT-S) 8.6-50 MG tablet Take 1 tablet by mouth as needed.    sildenafil  (VIAGRA ) 100 MG tablet TAKE 1 TABLET BY MOUTH ONCE DAILY AS NEEDED FOR ERECTILE DYSFUNCTION   tamsulosin  (FLOMAX ) 0.4 MG CAPS capsule Take 1 capsule (0.4 mg total) by mouth daily.   timolol (TIMOPTIC) 0.5 % ophthalmic solution Place 1 drop into both eyes 3 times daily.   UNABLE TO FIND Take 5 mLs by mouth every 4 (four) hours as needed. Med Name: Apothecary Cough Syrup Take 5 mL by mouth every 4 to 6 hours as needed for cough   [DISCONTINUED] Insulin  Lispro Prot & Lispro (HUMALOG  MIX 75/25 KWIKPEN) (75-25) 100 UNIT/ML Kwikpen Inject 45 Units into the skin 2 (two) times daily with a meal.   No facility-administered  encounter medications on file as of 07/22/2024.    ALLERGIES: No Known Allergies  VACCINATION STATUS: Immunization History  Administered Date(s) Administered   Fluad Quad(high Dose 65+) 06/27/2021   Influenza, Seasonal, Injecte, Preservative Fre 05/13/2023, 06/03/2024   Influenza,inj,Quad PF,6+ Mos 05/29/2022   PFIZER(Purple Top)SARS-COV-2 Vaccination 10/17/2019, 11/07/2019   Pneumococcal Conjugate-13 08/08/2021  Pneumococcal Polysaccharide-23 06/30/2020   Tdap 02/03/2012, 08/30/2022   Zoster Recombinant(Shingrix) 10/05/2021, 11/23/2021    Diabetes He presents for his follow-up diabetic visit. He has type 2 diabetes mellitus. Onset time: He was diagnosed at approximate age of 39 years. His disease course has been worsening. Pertinent negatives for hypoglycemia include no confusion, headaches, nervousness/anxiousness, pallor, seizures, sweats or tremors. Pertinent negatives for diabetes include no blurred vision, no chest pain, no fatigue, no polydipsia, no polyphagia, no polyuria and no weakness. There are no hypoglycemic complications. Symptoms are worsening. Diabetic complications include impotence, nephropathy, peripheral neuropathy, PVD and retinopathy. (He is reporting legal blindness from diabetic retinopathy on both eyes.) Risk factors for coronary artery disease include dyslipidemia, diabetes mellitus, family history, male sex, hypertension and sedentary lifestyle. Current diabetic treatments: He is currently on Lantus  15-20 units 1-2 times a day. He is compliant with treatment none of the time. His weight is fluctuating minimally. He is following a generally unhealthy diet. When asked about meal planning, he reported none. His home blood glucose trend is increasing steadily. His breakfast blood glucose range is generally 180-200 mg/dl. His lunch blood glucose range is generally 180-200 mg/dl. His dinner blood glucose range is generally 180-200 mg/dl. His bedtime blood glucose range is  generally 180-200 mg/dl. His overall blood glucose range is 180-200 mg/dl. (Mr. Pope did not stay on prescribed medications.  He has lowered his insulin  significantly.  His AGP report shows 50% time in range, 29% level 1 hyperglycemia, 21% level 2 hyperglycemia.  No hypoglycemia.  His point-of-care A1c is 10% increasing from 9.6% during his last visit.  He is currently on steroids for him due to rash to his face.  His average blood glucose 195 mg per DL for the most recent 2 weeks. ) He does not see a podiatrist.Eye exam is current.  Hyperlipidemia This is a chronic problem. The current episode started more than 1 year ago. The problem is uncontrolled. Recent lipid tests were reviewed and are high. Exacerbating diseases include chronic renal disease and diabetes. Pertinent negatives include no chest pain, myalgias or shortness of breath. Current antihyperlipidemic treatment includes statins. Risk factors for coronary artery disease include diabetes mellitus, dyslipidemia, hypertension, male sex, a sedentary lifestyle and family history.  Hypertension This is a chronic problem. The current episode started more than 1 year ago. The problem is controlled. Pertinent negatives include no blurred vision, chest pain, headaches, neck pain, palpitations, shortness of breath or sweats. Risk factors for coronary artery disease include dyslipidemia, family history, diabetes mellitus, male gender and sedentary lifestyle. Past treatments include calcium  channel blockers. Hypertensive end-organ damage includes kidney disease, PVD and retinopathy. Identifiable causes of hypertension include chronic renal disease.     Objective:       07/22/2024    9:57 AM 07/20/2024   12:43 PM 07/20/2024   11:15 AM  Vitals with BMI  Height 5' 9    Weight 174 lbs 6 oz    BMI 25.74    Systolic 106 165 872  Diastolic 66 88 92  Pulse 76 88 86    BP 106/66   Pulse 76   Ht 5' 9 (1.753 m)   Wt 174 lb 6.4 oz (79.1 kg)   BMI  25.75 kg/m   Wt Readings from Last 3 Encounters:  07/22/24 174 lb 6.4 oz (79.1 kg)  07/20/24 177 lb 14.6 oz (80.7 kg)  06/03/24 178 lb (80.7 kg)       CMP ( most recent) CMP  Component Value Date/Time   NA 139 07/06/2024 0831   K 5.1 07/06/2024 0831   CL 104 07/06/2024 0831   CO2 21 07/06/2024 0831   GLUCOSE 209 (H) 07/06/2024 0831   GLUCOSE 256 (H) 12/30/2019 1218   BUN 32 (H) 07/06/2024 0831   CREATININE 1.45 (H) 07/06/2024 0831   CALCIUM  9.4 07/06/2024 0831   PROT 7.5 07/06/2024 0831   ALBUMIN 4.4 07/06/2024 0831   AST 22 07/06/2024 0831   ALT 28 07/06/2024 0831   ALKPHOS 76 07/06/2024 0831   BILITOT 0.4 07/06/2024 0831   GFRNONAA >60 12/30/2019 1218   GFRAA >60 12/30/2019 1218     Diabetic Labs (most recent): Lab Results  Component Value Date   HGBA1C 10.0 (A) 07/22/2024   HGBA1C 9.6 (A) 02/25/2024   HGBA1C 9.8 (A) 08/30/2023     Lipid Panel ( most recent) Lipid Panel     Component Value Date/Time   CHOL 134 07/06/2024 0831   TRIG 106 07/06/2024 0831   HDL 40 07/06/2024 0831   CHOLHDL 3.4 07/06/2024 0831   CHOLHDL 4.4 12/30/2019 1218   VLDL 15 12/30/2019 1218   LDLCALC 74 07/06/2024 0831   LABVLDL 20 07/06/2024 0831      Assessment & Plan:   1. Type 2 diabetes, uncontrolled, with retinopathy with macular edema (HCC)  - Omair Dettmer has currently uncontrolled symptomatic type 2 DM since  63 years of age.  Mr. Farrior did not stay on prescribed medications.  He has lowered his insulin  significantly.  His AGP report shows 50% time in range, 29% level 1 hyperglycemia, 21% level 2 hyperglycemia.  No hypoglycemia.  His point-of-care A1c is 10% increasing from 9.6% during his last visit.  He is currently on steroids for him due to rash to his face.  His average blood glucose 195 mg per DL for the most recent 2 weeks.   - I had a long discussion with him about the progressive nature of diabetes and the pathology behind its complications. -his diabetes is  complicated by retinopathy, peripheral arterial disease, peripheral neuropathy and he remains at a high risk for more acute and chronic complications which include CAD, CVA, CKD, retinopathy, and neuropathy. These are all discussed in detail with him.  - he acknowledges that there is a room for improvement in his food and drink choices. - Suggestion is made for him to avoid simple carbohydrates  from his diet including Cakes, Sweet Desserts, Ice Cream, Soda (diet and regular), Sweet Tea, Candies, Chips, Cookies, Store Bought Juices, Alcohol , Artificial Sweeteners,  Coffee Creamer, and Sugar-free Products, Lemonade. This will help patient to have more stable blood glucose profile and potentially avoid unintended weight gain.  He is following with [name] for DE. - I have approached him with the following individualized plan to manage  his diabetes and patient agrees:   - In the interim, patient states that he was advised by his PCP to lower his insulin  to 15 units twice daily.  - In light of his presentation with significantly above target glycemic profile, he will continue treatment with daily injections of a higher dose of premixed insulin  in order for him to control diabetes to target.  Accordingly, he is advised to increase Humalog  75/25 to 30  units with breakfast and 30 units with supper  only when Premeal blood glucose is above 90 mg per DL and only when he is eating.   -He is advised to utilize his CGM continuously.  He is encouraged to call clinic for hypoglycemia below 70 or hyperglycemia above 200 mg per DL.   - he is warned not to take insulin  without proper monitoring per orders. -He is tolerating the lower dose Jardiance .  He is advised to continue Jardiance  10 mg p.o. daily at breakfast along with glipizide  5 mg p.o. daily.  Side effects and precautions discussed with him. - he is encouraged to call clinic for blood glucose levels less than 70 or above 200 mg /dl.  -  Specific targets for  A1c;  LDL, HDL,  and Triglycerides were discussed with the patient.  2) Blood Pressure /Hypertension: His blood pressure is controlled to target. he is advised to continue his current medications including amlodipine  10 mg p.o.. daily with breakfast .  He will be considered for HCTZ/lisinopril next visit.   3) Lipids/Hyperlipidemia:   -His recent lipid panel showed near target LDL at 74.  He is advised to continue Crestor  10 mg p.o. daily at bedtime.  Side effects and precautions discussed with him.     4)  Weight/Diet:  Body mass index is 25.75 kg/m.  -   he is not a candidate for major weight loss. Exercise, and detailed carbohydrates information provided  -  detailed on discharge instructions.  5) Chronic Care/Health Maintenance:  -he  is on  Statin medications and  is encouraged to initiate and continue to follow up with Ophthalmology, Dentist,  Podiatrist at least yearly or according to recommendations, and advised to   stay away from smoking. I have recommended yearly flu vaccine and pneumonia vaccine at least every 5 years; moderate intensity exercise for up to 150 minutes weekly; and  sleep for at least 7 hours a day.  6) vitamin D  deficiency: He is advised to continue his vitamin D3 5 thyroid units daily.       His next study is due in September 2027, or sooner if needed.  - he is  advised to maintain close follow up with his pcp for primary care needs, as well as his other providers for optimal and coordinated care  I spent  41  minutes in the care of the patient today including review of labs from CMP, Lipids, Thyroid Function, Hematology (current and previous including abstractions from other facilities); face-to-face time discussing  his blood glucose readings/logs, discussing hypoglycemia and hyperglycemia episodes and symptoms, medications doses, his options of short and long term treatment based on the latest standards of care / guidelines;  discussion  about incorporating lifestyle medicine;  and documenting the encounter. Risk reduction counseling performed per USPSTF guidelines to reduce  cardiovascular risk factors.     Please refer to Patient Instructions for Blood Glucose Monitoring and Insulin /Medications Dosing Guide  in media tab for additional information. Please  also refer to  Patient Self Inventory in the Media  tab for reviewed elements of pertinent patient history.  Dasie Moats participated in the discussions, expressed understanding, and voiced agreement with the above plans.  All questions were answered to his satisfaction. he is encouraged to contact clinic should he have any questions or concerns prior to his return visit.    Follow up plan: - Return in about 3 months (around 10/20/2024) for Bring Meter/CGM Device/Logs- A1c in Office.  Ranny Earl, MD [facility] [address] [place], [place] [address] Phone: [phone]  Fax: [phone]    07/22/2024, 11:16 AM  This note was partially dictated with voice recognition software.  Similar sounding words can be transcribed inadequately or may not  be corrected upon review.

## 2024-07-22 NOTE — Patient Instructions (Signed)

## 2024-07-27 ENCOUNTER — Ambulatory Visit: Admitting: Podiatry

## 2024-07-27 ENCOUNTER — Encounter: Payer: Self-pay | Admitting: Podiatry

## 2024-07-27 DIAGNOSIS — M79675 Pain in left toe(s): Secondary | ICD-10-CM

## 2024-07-27 DIAGNOSIS — M79674 Pain in right toe(s): Secondary | ICD-10-CM

## 2024-07-27 DIAGNOSIS — E1151 Type 2 diabetes mellitus with diabetic peripheral angiopathy without gangrene: Secondary | ICD-10-CM

## 2024-07-27 DIAGNOSIS — L84 Corns and callosities: Secondary | ICD-10-CM

## 2024-07-27 DIAGNOSIS — B351 Tinea unguium: Secondary | ICD-10-CM

## 2024-07-27 NOTE — Progress Notes (Signed)
 Upon entering the room to see the patient for scheduled nail and callus care, the family member that was with him stood up and got near the foot portion of the exam chair where I was working.  She wanted to see what was going on to make sure that you all do not cut on him, and he needs an antibiotic because the toe is infected.  I told her we would take a look but if she could please have a seat.  She became upset and stated she has every right to however near where I am working with sharp instruments.  I told her I did not feel comfortable proceeding in this manner and she continued to be argumentative.  I left the room to grab a supervisor to discuss this with the patient.  When I came back to the exam room, they were gone.

## 2024-07-28 ENCOUNTER — Ambulatory Visit: Admitting: Podiatry

## 2024-07-28 ENCOUNTER — Other Ambulatory Visit: Payer: Self-pay | Admitting: Internal Medicine

## 2024-07-28 DIAGNOSIS — N400 Enlarged prostate without lower urinary tract symptoms: Secondary | ICD-10-CM

## 2024-07-29 ENCOUNTER — Other Ambulatory Visit: Payer: Self-pay | Admitting: "Endocrinology

## 2024-08-23 ENCOUNTER — Other Ambulatory Visit: Payer: Self-pay | Admitting: Internal Medicine

## 2024-08-23 ENCOUNTER — Other Ambulatory Visit: Payer: Self-pay | Admitting: "Endocrinology

## 2024-08-23 DIAGNOSIS — N400 Enlarged prostate without lower urinary tract symptoms: Secondary | ICD-10-CM

## 2024-08-23 DIAGNOSIS — E113592 Type 2 diabetes mellitus with proliferative diabetic retinopathy without macular edema, left eye: Secondary | ICD-10-CM

## 2024-09-20 ENCOUNTER — Other Ambulatory Visit: Payer: Self-pay | Admitting: "Endocrinology

## 2024-09-20 ENCOUNTER — Other Ambulatory Visit: Payer: Self-pay | Admitting: Internal Medicine

## 2024-09-20 DIAGNOSIS — E782 Mixed hyperlipidemia: Secondary | ICD-10-CM

## 2024-09-20 DIAGNOSIS — N400 Enlarged prostate without lower urinary tract symptoms: Secondary | ICD-10-CM

## 2024-09-22 ENCOUNTER — Ambulatory Visit: Admitting: Internal Medicine

## 2024-09-24 ENCOUNTER — Other Ambulatory Visit: Payer: Self-pay | Admitting: Internal Medicine

## 2024-09-24 DIAGNOSIS — N529 Male erectile dysfunction, unspecified: Secondary | ICD-10-CM

## 2024-09-25 ENCOUNTER — Telehealth: Payer: Self-pay | Admitting: Podiatry

## 2024-09-25 NOTE — Telephone Encounter (Signed)
 Left a message with the patient's home health aid Advised: we rcvd Chad Kirk' message regarding Dr. Loel & could he give us  a call back at his earliest convenience to set up an appt with his preferred provider, Dr. Gaynel

## 2024-10-20 ENCOUNTER — Ambulatory Visit: Admitting: "Endocrinology

## 2024-11-10 ENCOUNTER — Ambulatory Visit: Admitting: Podiatry

## 2024-11-12 ENCOUNTER — Ambulatory Visit: Admitting: Internal Medicine
